# Patient Record
Sex: Female | Born: 1965 | Race: White | Hispanic: No | Marital: Married | Smoking: Never smoker
Health system: Southern US, Community
[De-identification: ages and names within clinical notes are randomized; demographics above are authoritative.]

## PROBLEM LIST (undated history)

## (undated) DIAGNOSIS — M810 Age-related osteoporosis without current pathological fracture: Secondary | ICD-10-CM

## (undated) DIAGNOSIS — I219 Acute myocardial infarction, unspecified: Secondary | ICD-10-CM

## (undated) DIAGNOSIS — I2542 Coronary artery dissection: Principal | ICD-10-CM

## (undated) DIAGNOSIS — H269 Unspecified cataract: Secondary | ICD-10-CM

## (undated) HISTORY — DX: Unspecified cataract: H26.9

## (undated) HISTORY — DX: Coronary artery dissection: I25.42

## (undated) HISTORY — DX: Age-related osteoporosis without current pathological fracture: M81.0

## (undated) HISTORY — PX: BUNIONECTOMY: SHX129

## (undated) HISTORY — PX: CATARACT EXTRACTION, BILATERAL: SHX1313

## (undated) HISTORY — DX: Acute myocardial infarction, unspecified: I21.9

---

## 2015-01-04 DIAGNOSIS — I219 Acute myocardial infarction, unspecified: Secondary | ICD-10-CM

## 2015-01-04 HISTORY — DX: Acute myocardial infarction, unspecified: I21.9

## 2015-01-27 DIAGNOSIS — I2542 Coronary artery dissection: Secondary | ICD-10-CM

## 2015-01-27 HISTORY — DX: Coronary artery dissection: I25.42

## 2016-10-11 ENCOUNTER — Ambulatory Visit: Payer: Self-pay | Admitting: Family Medicine

## 2016-10-21 ENCOUNTER — Encounter: Payer: Self-pay | Admitting: Family Medicine

## 2016-10-21 ENCOUNTER — Ambulatory Visit (INDEPENDENT_AMBULATORY_CARE_PROVIDER_SITE_OTHER): Payer: BC Managed Care – PPO | Admitting: Family Medicine

## 2016-10-21 VITALS — BP 108/84 | HR 69 | Temp 98.8°F | Wt 168.0 lb

## 2016-10-21 DIAGNOSIS — I2542 Coronary artery dissection: Secondary | ICD-10-CM | POA: Insufficient documentation

## 2016-10-21 DIAGNOSIS — Z23 Encounter for immunization: Secondary | ICD-10-CM | POA: Diagnosis not present

## 2016-10-21 DIAGNOSIS — H2513 Age-related nuclear cataract, bilateral: Secondary | ICD-10-CM | POA: Diagnosis not present

## 2016-10-21 MED ORDER — ASPIRIN 81 MG PO TABS: 81.0000 mg | ORAL_TABLET | Freq: Every day | ORAL | 0 refills | Status: DC

## 2016-10-21 MED ORDER — LEVONORGESTREL 20 MCG/24HR IU IUD: 1.0000 | INTRAUTERINE_SYSTEM | Freq: Once | INTRAUTERINE | 0 refills | Status: DC

## 2016-10-21 MED ORDER — ATORVASTATIN CALCIUM 40 MG PO TABS: 40.0000 mg | ORAL_TABLET | Freq: Every day | ORAL | 3 refills | Status: DC

## 2016-10-21 NOTE — Patient Instructions (Signed)
WE NOW OFFER   [facility] FAST TRACK!!!  SAME DAY Appointments for ACUTE CARE  Such as: Sprains, Injuries, cuts, abrasions, rashes, muscle pain, joint pain, back pain Colds, flu, sore throats, headache, allergies, cough, fever  Ear pain, sinus and eye infections Abdominal pain, nausea, vomiting, diarrhea, upset stomach Animal/insect bites  3 Easy Ways to Schedule: Walk-In Scheduling Call in scheduling Mychart Sign-up: [email or web address]         

## 2016-10-21 NOTE — Progress Notes (Signed)
   Subjective:    Patient ID: Tina Juarez, female    DOB: December 25, 1965, 51 y.o.   MRN: [id number]  HPI 51 yr old female to establish with [place]. She moved to [place] from [place] about one month ago. She feels well but has a few requests. 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 This resulted in a small non-STEMI which was managed medically. Part of her follow up care included MRI scans of the carotids and renal arteries in March 2017 and then aCT angiogram of the heart in October 2017. These were all unremarkable and showed no evidence of aneurysms or of fibromuscular dysplasia. Her lipids were apparently within normal limits but she was started on Atorvastatin as a precaution. She has been taking low dose aspirin. She feels fine with no chest pain or SOB, and she exercises regularly. She needs to be established with a cardiologist here in [place]. She has early cataracts and needs a referral to an ophthalmologist as well. 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 She lives with her husband and son.    Review of Systems  Constitutional: Negative.   Respiratory: Negative.   Cardiovascular: Negative.   Gastrointestinal: Negative.   Neurological: Negative.        Objective:   Physical Exam  Constitutional: She is oriented to person, place, and time. She appears well-developed and well-nourished.  Neck: No thyromegaly present.  Cardiovascular: Normal rate, regular rhythm, normal heart sounds and intact distal pulses.   Pulmonary/Chest: Effort normal and breath sounds normal. No respiratory distress. She has no wheezes. She has no rales.  Musculoskeletal: She exhibits no edema.  Lymphadenopathy:    She has no cervical adenopathy.  Neurological: She is alert and oriented to person, place, and time.          Assessment & Plan:  Introductory  visit with this patient. She is stable after a spontaneous coronary artery dissection last year. We will refer her to Cardiology. She has early cataracts so we will refer her to Ophthalmology. Given a flu shot and a rx for Shingrix vaccines.  Alysia Penna, MD

## 2017-01-16 ENCOUNTER — Encounter (INDEPENDENT_AMBULATORY_CARE_PROVIDER_SITE_OTHER): Payer: Self-pay

## 2017-01-16 ENCOUNTER — Encounter: Payer: Self-pay | Admitting: Cardiovascular Disease

## 2017-01-16 ENCOUNTER — Ambulatory Visit: Payer: BC Managed Care – PPO | Admitting: Cardiovascular Disease

## 2017-01-16 VITALS — BP 126/70 | HR 74 | Ht 67.0 in | Wt 174.0 lb

## 2017-01-16 DIAGNOSIS — I2510 Atherosclerotic heart disease of native coronary artery without angina pectoris: Secondary | ICD-10-CM

## 2017-01-16 NOTE — Progress Notes (Signed)
Cardiology Office Note Date:  01/19/2017   ID:  Wren, Pryce Sep 28, 1965, MRN [id number]  PCP:  Laurey Morale, MD  Cardiologist:  Sherren Mocha, MD    Chief Complaint  Patient presents with  . Establish Care    Hx Spontaneous Coronary Dissection     History of Present Illness: Tina Juarez is a 52 y.o. female who presents for initial evaluation of CAD with hx of spontaneous coronary artery dissection in 2017. She presented with NSTEMI (living in [place] at the time) and was found to have spontaneous dissection of the right PDA without other obstructive disease. She was treated conservatively with ASA and a statin drug.   Follow-up studies included an MRA of the cervical and renal arteries demonstrating no evidence of fibromuscular dysplasia, a CTA of the coronaries demonstrating normal appearance of the coronary arteries with no atherosclerosis and complete healing of the PDA. An echo at the time of her event demonstrated an apical wall motion abnormality with preserved LV systolic function.   She is here alone today.  She is doing fine with no specific cardiac-related complaints.  She denies chest pain, chest pressure, shortness of breath, edema, heart palpitations, lightheadedness, syncope, orthopnea, or PND.  She is physically active with no exertional complaints.  She feels like she is tolerating her medicines well.  Past Medical History:  Diagnosis Date  . Cataract   . 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 No pertinent surgical history.  Current Outpatient Medications  Medication Sig Dispense Refill  . aspirin 81 MG tablet Take 1 tablet (81 mg total) by mouth daily. 30 tablet 0  . levonorgestrel (MIRENA, 52 MG,) 20 MCG/24HR IUD 1 Intra Uterine Device (1 each total) by Intrauterine route once. 1 each 0   No current  facility-administered medications for this visit.     Allergies:   Patient has no known allergies.   Social History:  The patient  reports that  has never smoked. she has never used smokeless tobacco.   Family History:  The patient's family history includes Cancer in her father; Hyperlipidemia in her father; Mental retardation in her mother.    ROS:  Please see the history of present illness.   All other systems are reviewed and negative.    PHYSICAL EXAM: VS:  BP 126/70   Pulse 74   Ht 5\' 7"  (1.702 m)   Wt 174 lb (78.9 kg)   SpO2 95%   BMI 27.25 kg/m  , BMI Body mass index is 27.25 kg/m. GEN: Well nourished, well developed, in no acute distress  HEENT: normal  Neck: no JVD, no masses. No carotid bruits Cardiac: RRR without murmur or gallop                Respiratory:  clear to auscultation bilaterally, normal work of breathing GI: soft, nontender, nondistended, + BS MS: no deformity or atrophy  Ext: no pretibial edema, pedal pulses 2+= bilaterally Skin: warm and dry, no rash Neuro:  Strength and sensation are intact Psych: euthymic mood, full affect  EKG:  EKG is ordered today. The ekg ordered today shows normal sinus rhythm 66 bpm, left axis deviation, nonspecific T wave flattening.  Recent Labs: No results found for requested labs within last 8760 hours.   Lipid Panel  No results found for: CHOL, TRIG, HDL, CHOLHDL, VLDL, LDLCALC,  LDLDIRECT    Wt Readings from Last 3 Encounters:  01/16/17 174 lb (78.9 kg)  10/21/16 168 lb (76.2 kg)     Cardiac Studies Reviewed: Cardiac catheterization, echo, and other imaging studies are all reviewed today.  These studies are scanned into the medical record.  Lipids are reviewed from 2015 prior to any cholesterol medication, but they are reported out in millimoles per liter with an HDL of 1.47 mmol/L, LDL 2.6 mmol/L, and total cholesterol 4.5 mmol/L.  All of these are within expected limits.  ASSESSMENT AND PLAN: Coronary  artery disease, native vessel, without angina: The patient is clinically stable and has had no recurrent events since her initial presentation in 2017.  Her studies are reviewed as detailed in the HPI.  Fortunately her follow-up coronary CT angiogram demonstrated normal-appearing coronary arteries including the PDA branch of the RCA which.  We reviewed recommended medical therapy and she will remain on aspirin 81 mg daily.  We discussed current guidelines related spontaneous coronary artery dissection and it does not appear that the patient had hyperlipidemia predating her event.  There does not appear to be significant benefit from use of a statin agent in this disease process based on current recommendations.  Will discontinue her statin drug and check lipids in 8 weeks.  I will plan to see her back in 1 year unless problems arise in the interim.  We discussed restrictions and recommend that she avoid heavy lifting or extremely vigorous exercise, but she understands normal aerobic activity and exercise is encouraged.  Current medicines are reviewed with the patient today.  The patient does not have concerns regarding medicines.  Labs/ tests ordered today include:   Orders Placed This Encounter  Procedures  . Lipid panel  . Hepatic function panel  . EKG 12-Lead    Disposition:   FU one year  Signed, Sherren Mocha, MD  01/19/2017 8:10 AM    [address], [place], [place]  [address] Phone: [phone]; Fax: [phone]

## 2017-01-16 NOTE — Patient Instructions (Signed)
Medication Instructions:  1) STOP LIPITOR  Labwork: Return for fasting labs in 2 MONTHS. Lab is open from 7:30AM to 5:00PM.  Testing/Procedures: None  Follow-Up: Your provider wants you to follow-up in: 1 year with Dr. Burt Knack. You will receive a reminder letter in the mail two months in advance. If you don't receive a letter, please call our office to schedule the follow-up appointment.    Any Other Special Instructions Will Be Listed Below (If Applicable).     If you need a refill on your cardiac medications before your next appointment, please call your pharmacy.

## 2017-01-19 ENCOUNTER — Encounter: Payer: Self-pay | Admitting: Cardiovascular Disease

## 2017-01-31 ENCOUNTER — Telehealth: Payer: Self-pay | Admitting: *Deleted

## 2017-01-31 NOTE — Telephone Encounter (Signed)
   [facility] Pre-operative Risk Assessment    Request for surgical clearance:  1. What type of surgery is being performed? Cataract extraction w/intraocular lens implantation of the right eye followed by the left eye   2. When is this surgery scheduled? 03/13/17 and 03/27/17   3. What type of clearance is required (medical clearance vs. Pharmacy clearance to hold med vs. Both)? Medical  4. Are there any medications that need to be held prior to surgery and how long?No. Pt does not need to stop any medication   5. Practice name and name of physician performing surgery? [facility]. Dr. Talbert Forest   6. What is your office phone and fax number? [phone].  Fax-[phone]   7. Anesthesia type (None, local, MAC, general) ? Topical anesthesia w/IV medication    _________________________________________________________________   (provider comments below)

## 2017-02-01 NOTE — Telephone Encounter (Signed)
   Primary Cardiologist: Sherren Mocha, MD  Chart reviewed as part of pre-operative protocol coverage. Given past medical history and time since last visit, based on ACC/AHA guidelines, Tina Juarez would be at acceptable risk for the planned procedure without further cardiovascular testing.   I will route this recommendation to the requesting party via Epic fax function and remove from pre-op pool.  Callback pool, please call office and make sure documentation was received.  Please call with questions.  Tami Lin Duke, PA 02/01/2017, 2:33 PM

## 2017-02-01 NOTE — Telephone Encounter (Signed)
LMOM Dr. Talbert Forest office we have faxed via Epic clearance letter. Any questions please call [phone].

## 2017-03-14 ENCOUNTER — Other Ambulatory Visit: Payer: BC Managed Care – PPO

## 2017-03-14 DIAGNOSIS — I2510 Atherosclerotic heart disease of native coronary artery without angina pectoris: Secondary | ICD-10-CM

## 2017-03-14 LAB — LIPID PANEL
CHOL/HDL RATIO: 2.7 ratio (ref 0.0–4.4)
Cholesterol, Total: 173 mg/dL (ref 100–199)
HDL: 64 mg/dL (ref >39)
LDL Calculated: 96 mg/dL (ref 0–99)
Triglycerides: 64 mg/dL (ref 0–149)
VLDL CHOLESTEROL CAL: 13 mg/dL (ref 5–40)

## 2017-03-14 LAB — HEPATIC FUNCTION PANEL
ALK PHOS: 66 IU/L (ref 39–117)
ALT: 11 IU/L (ref 0–32)
AST: 14 IU/L (ref 0–40)
Albumin: 4.2 g/dL (ref 3.5–5.5)
BILIRUBIN, DIRECT: 0.14 mg/dL (ref 0.00–0.40)
Bilirubin Total: 0.4 mg/dL (ref 0.0–1.2)
TOTAL PROTEIN: 6.8 g/dL (ref 6.0–8.5)

## 2017-05-11 ENCOUNTER — Telehealth: Payer: Self-pay | Admitting: Family Medicine

## 2017-05-11 DIAGNOSIS — Z0000 Encounter for general adult medical examination without abnormal findings: Secondary | ICD-10-CM

## 2017-05-11 DIAGNOSIS — Z209 Contact with and (suspected) exposure to unspecified communicable disease: Secondary | ICD-10-CM

## 2017-05-11 NOTE — Telephone Encounter (Signed)
Done

## 2017-06-14 ENCOUNTER — Encounter: Payer: Self-pay | Admitting: Internal Medicine

## 2017-06-16 ENCOUNTER — Other Ambulatory Visit (INDEPENDENT_AMBULATORY_CARE_PROVIDER_SITE_OTHER): Payer: BC Managed Care – PPO

## 2017-06-16 DIAGNOSIS — Z209 Contact with and (suspected) exposure to unspecified communicable disease: Secondary | ICD-10-CM

## 2017-06-19 LAB — MEASLES/MUMPS/RUBELLA IMMUNITY
Mumps IgG: <9 AU/mL — ABNORMAL LOW
Rubella: 1.61 index

## 2017-07-03 ENCOUNTER — Ambulatory Visit (INDEPENDENT_AMBULATORY_CARE_PROVIDER_SITE_OTHER): Payer: BC Managed Care – PPO | Admitting: *Deleted

## 2017-07-03 DIAGNOSIS — Z23 Encounter for immunization: Secondary | ICD-10-CM

## 2017-07-03 NOTE — Progress Notes (Signed)
Per orders of Dr. Sarajane Jews, injection of MMR vaccine given by Dorrene German. Patient tolerated injection well. Discussed vaccine series w/ Dr. Sarajane Jews and he stated that one vaccine is adequate for this patient.

## 2017-08-15 ENCOUNTER — Ambulatory Visit (AMBULATORY_SURGERY_CENTER): Payer: Self-pay | Admitting: *Deleted

## 2017-08-15 VITALS — Ht 67.0 in | Wt 170.8 lb

## 2017-08-15 DIAGNOSIS — Z1211 Encounter for screening for malignant neoplasm of colon: Secondary | ICD-10-CM

## 2017-08-15 NOTE — Progress Notes (Signed)
No egg or soy allergy known to patient  No issues with past sedation with any surgeries  or procedures, no intubation problems  No diet pills per patient No home 02 use per patient  No blood thinners per patient  Pt denies issues with constipation  No A fib or A flutter  EMMI video sent to pt's e mail  

## 2017-08-29 ENCOUNTER — Encounter: Payer: BC Managed Care – PPO | Admitting: Internal Medicine

## 2017-09-27 ENCOUNTER — Encounter: Payer: Self-pay | Admitting: Internal Medicine

## 2017-10-11 ENCOUNTER — Encounter: Payer: Self-pay | Admitting: Internal Medicine

## 2017-10-11 ENCOUNTER — Ambulatory Visit (AMBULATORY_SURGERY_CENTER): Payer: BC Managed Care – PPO | Admitting: Internal Medicine

## 2017-10-11 VITALS — BP 109/73 | HR 68 | Temp 98.7°F | Resp 14 | Ht 67.0 in | Wt 170.0 lb

## 2017-10-11 DIAGNOSIS — Z1211 Encounter for screening for malignant neoplasm of colon: Secondary | ICD-10-CM | POA: Diagnosis present

## 2017-10-11 DIAGNOSIS — D125 Benign neoplasm of sigmoid colon: Secondary | ICD-10-CM

## 2017-10-11 DIAGNOSIS — K635 Polyp of colon: Secondary | ICD-10-CM | POA: Diagnosis not present

## 2017-10-11 DIAGNOSIS — D129 Benign neoplasm of anus and anal canal: Secondary | ICD-10-CM

## 2017-10-11 DIAGNOSIS — D127 Benign neoplasm of rectosigmoid junction: Secondary | ICD-10-CM | POA: Diagnosis not present

## 2017-10-11 DIAGNOSIS — D128 Benign neoplasm of rectum: Secondary | ICD-10-CM

## 2017-10-11 DIAGNOSIS — K621 Rectal polyp: Secondary | ICD-10-CM | POA: Diagnosis not present

## 2017-10-11 HISTORY — PX: COLONOSCOPY: SHX174

## 2017-10-11 MED ORDER — SODIUM CHLORIDE 0.9 % IV SOLN: 500.0000 mL | INTRAVENOUS | Status: DC

## 2017-10-11 NOTE — Progress Notes (Signed)
A and O x3. Report to RN. Tolerated MAC anesthesia well.

## 2017-10-11 NOTE — Progress Notes (Signed)
Called to room to assist during endoscopic procedure.  Patient ID and intended procedure confirmed with present staff. Received instructions for my participation in the procedure from the performing physician.  

## 2017-10-11 NOTE — Patient Instructions (Addendum)
   I found and removed 2 tiny polyps. I will let you know pathology results and when to have another routine colonoscopy by mail and/or My Chart.  I appreciate the opportunity to care for you. Carl E. Gessner, MD, FACG  YOU HAD AN ENDOSCOPIC PROCEDURE TODAY AT THE [facility]:   Refer to the procedure report that was given to you for any specific questions about what was found during the examination.  If the procedure report does not answer your questions, please call your gastroenterologist to clarify.  If you requested that your care partner not be given the details of your procedure findings, then the procedure report has been included in a sealed envelope for you to review at your convenience later.  YOU SHOULD EXPECT: Some feelings of bloating in the abdomen. Passage of more gas than usual.  Walking can help get rid of the air that was put into your GI tract during the procedure and reduce the bloating. If you had a lower endoscopy (such as a colonoscopy or flexible sigmoidoscopy) you may notice spotting of blood in your stool or on the toilet paper. If you underwent a bowel prep for your procedure, you may not have a normal bowel movement for a few days.  Please Note:  You might notice some irritation and congestion in your nose or some drainage.  This is from the oxygen used during your procedure.  There is no need for concern and it should clear up in a day or so.  SYMPTOMS TO REPORT IMMEDIATELY:   Following lower endoscopy (colonoscopy or flexible sigmoidoscopy):  Excessive amounts of blood in the stool  Significant tenderness or worsening of abdominal pains  Swelling of the abdomen that is new, acute  Fever of 100F or higher   For urgent or emergent issues, a gastroenterologist can be reached at any hour by calling [phone].   DIET:  We do recommend a small meal at first, but then you may proceed to your regular diet.  Drink plenty of fluids but you should  avoid alcoholic beverages for 24 hours.  ACTIVITY:  You should plan to take it easy for the rest of today and you should NOT DRIVE or use heavy machinery until tomorrow (because of the sedation medicines used during the test).    FOLLOW UP: Our staff will call the number listed on your records the next business day following your procedure to check on you and address any questions or concerns that you may have regarding the information given to you following your procedure. If we do not reach you, we will leave a message.  However, if you are feeling well and you are not experiencing any problems, there is no need to return our call.  We will assume that you have returned to your regular daily activities without incident.  If any biopsies were taken you will be contacted by phone or by letter within the next 1-3 weeks.  Please call us at [phone] if you have not heard about the biopsies in 3 weeks.    SIGNATURES/CONFIDENTIALITY: You and/or your care partner have signed paperwork which will be entered into your electronic medical record.  These signatures attest to the fact that that the information above on your After Visit Summary has been reviewed and is understood.  Full responsibility of the confidentiality of this discharge information lies with you and/or your care-partner. 

## 2017-10-11 NOTE — Op Note (Signed)
[facility] Patient Name: Tina Juarez Procedure Date: 10/11/2017 9:04 AM MRN: [id number] Endoscopist: Gatha Mayer , MD Age: 52 Referring MD:  Date of Birth: Jul 29, 1965 Gender: Female Account #: [id number] Procedure:                Colonoscopy Indications:              Screening for colorectal malignant neoplasm, This                            is the patient's first colonoscopy Medicines:                Propofol per Anesthesia, Monitored Anesthesia Care Procedure:                Pre-Anesthesia Assessment:                           - Prior to the procedure, a History and Physical                            was performed, and patient medications and                            allergies were reviewed. The patient's tolerance of                            previous anesthesia was also reviewed. The risks                            and benefits of the procedure and the sedation                            options and risks were discussed with the patient.                            All questions were answered, and informed consent                            was obtained. Prior Anticoagulants: The patient has                            taken no previous anticoagulant or antiplatelet                            agents. ASA Grade Assessment: II - A patient with                            mild systemic disease. After reviewing the risks                            and benefits, the patient was deemed in                            satisfactory condition to undergo the procedure.  After obtaining informed consent, the colonoscope                            was passed under direct vision. Throughout the                            procedure, the patient's blood pressure, pulse, and                            oxygen saturations were monitored continuously. The                            Model PCF-[id number] scope was introduced     through the anus and advanced to the the cecum,                            identified by appendiceal orifice and ileocecal                            valve. The colonoscopy was performed without                            difficulty. The patient tolerated the procedure                            well. The quality of the bowel preparation was                            good. The ileocecal valve, appendiceal orifice, and                            rectum were photographed. The bowel preparation                            used was Miralax. Scope In: 9:14:10 AM Scope Out: 9:36:09 AM Scope Withdrawal Time: 0 hours 15 minutes 15 seconds  Total Procedure Duration: 0 hours 21 minutes 59 seconds  Findings:                 The perianal and digital rectal examinations were                            normal.                           Two sessile polyps were found in the rectum and                            sigmoid colon. The polyps were 2 to 3 mm in size.                            These polyps were removed with a cold biopsy                            forceps.  Resection and retrieval were complete.                            Verification of patient identification for the                            specimen was done. Estimated blood loss was minimal.                           The exam was otherwise without abnormality on                            direct and retroflexion views. Complications:            No immediate complications. Estimated Blood Loss:     Estimated blood loss was minimal. Impression:               - Two 2 to 3 mm polyps in the rectum and in the                            sigmoid colon, removed with a cold biopsy forceps.                            Resected and retrieved.                           - The examination was otherwise normal on direct                            and retroflexion views. Recommendation:           - Patient has a contact number available for                             emergencies. The signs and symptoms of potential                            delayed complications were discussed with the                            patient. Return to normal activities tomorrow.                            Written discharge instructions were provided to the                            patient.                           - Resume previous diet.                           - Continue present medications.                           - Repeat colonoscopy is recommended. The  colonoscopy date will be determined after pathology                            results from today's exam become available for                            review. Gatha Mayer, MD 10/11/2017 9:42:04 AM This report has been signed electronically.

## 2017-10-12 ENCOUNTER — Telehealth: Payer: Self-pay

## 2017-10-12 NOTE — Telephone Encounter (Signed)
  Follow up Call-  Call back number 10/11/2017  Post procedure Call Back phone  # [phone]  Permission to leave phone message Yes     Patient questions:  Do you have a fever, pain , or abdominal swelling? No. Pain Score  0 *  Have you tolerated food without any problems? Yes.    Have you been able to return to your normal activities? Yes.    Do you have any questions about your discharge instructions: Diet   No. Medications  No. Follow up visit  No.  Do you have questions or concerns about your Care? No.  Actions: * If pain score is 4 or above: No action needed, pain <4.

## 2017-10-16 ENCOUNTER — Ambulatory Visit: Payer: Self-pay | Admitting: *Deleted

## 2017-10-16 ENCOUNTER — Encounter (HOSPITAL_COMMUNITY): Payer: Self-pay

## 2017-10-16 ENCOUNTER — Ambulatory Visit (INDEPENDENT_AMBULATORY_CARE_PROVIDER_SITE_OTHER): Payer: BC Managed Care – PPO

## 2017-10-16 ENCOUNTER — Ambulatory Visit (HOSPITAL_COMMUNITY)
Admission: EM | Admit: 2017-10-16 | Discharge: 2017-10-16 | Disposition: A | Discharge status: Home / Self Care | Payer: BC Managed Care – PPO | Attending: Family Medicine | Admitting: Family Medicine

## 2017-10-16 DIAGNOSIS — W19XXXA Unspecified fall, initial encounter: Secondary | ICD-10-CM

## 2017-10-16 DIAGNOSIS — S8264XA Nondisplaced fracture of lateral malleolus of right fibula, initial encounter for closed fracture: Secondary | ICD-10-CM

## 2017-10-16 MED ORDER — IBUPROFEN 800 MG PO TABS: 800.0000 mg | ORAL_TABLET | Freq: Three times a day (TID) | ORAL | 0 refills | Status: DC

## 2017-10-16 NOTE — ED Triage Notes (Signed)
Pt presents with right ankle pain and swelling from injury from [place].

## 2017-10-16 NOTE — Discharge Instructions (Signed)
Small fracture off end of ankle Use anti-inflammatories for pain/swelling. You may take up to 800 mg Ibuprofen every 8 hours with food. You may supplement Ibuprofen with Tylenol [phone] mg every 8 hours.  Ice and elevate when at home Please use boot and crutches to keep foot immobilized and non-weightbearing until following up with orthopedics Follow up with orthopedics

## 2017-10-16 NOTE — Telephone Encounter (Signed)
Patient's husband is calling to report that [place] now. His wife has fallen and sprained her ankle. He wants to know where to take her for x-ray as they are bring her down in a basket now. Informed patient that I am not aware of the closest hospital in that area- he states he is bringing her back to [facility] and wants to know the best UC. Told him he could take her to [facility] beside the ED- that way if she has broken a bone- she can be transferred over. He voices understanding. Patient not triaged.  Reason for Disposition . [1] Other NON-URGENT information for PCP AND [2] does not require PCP response  Answer Assessment - Initial Assessment Questions 1. REASON FOR CALL or QUESTION: "What is your reason for calling today?" or "How can I best help you?" or "What question do you have that I can help answer?"     Advisement about where to take wife for x-ray  2. CALLER: Document the source of call. (e.g., laboratory, patient).     Husband  Protocols used: PCP CALL - NO TRIAGE-A-AH

## 2017-10-16 NOTE — Telephone Encounter (Signed)
Dr Fry - FYI. Thanks! 

## 2017-10-16 NOTE — ED Provider Notes (Signed)
[place]    CSN: [id number] Arrival date & time: 10/16/17  [place]     History   Chief Complaint Chief Complaint  Patient presents with  . Ankle Pain    Right    HPI Keondria Siever is a 52 y.o. female no contributing past medical history presenting today for evaluation of right ankle injury.  Patient was hiking earlier today, [place] and [place] her ankle.  Since she has had significant pain and swelling as well as bruising.  Patient has not taken anything for pain.  [place] and [place] in temporary splint.  Denies previous issues with this ankle.  HPI  Past Medical History:  Diagnosis Date  . Cataract    removed bilat 03-2017  . Myocardial infarction [facility]) 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   . 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 BUNIONECTOMY    . CATARACT EXTRACTION, BILATERAL Bilateral    03-2017    OB History   None      Home Medications    Prior to Admission medications   Medication Sig Start Date End Date Taking? Authorizing Provider  aspirin 81 MG tablet Take 1 tablet (81 mg total) by mouth daily. 10/21/16   Laurey Morale, MD  ibuprofen (ADVIL,MOTRIN) 800 MG tablet Take 1 tablet (800 mg total) by mouth 3 (three) times daily. 10/16/17   Wieters, Hallie C, PA-C  levonorgestrel (MIRENA, 52 MG,) 20 MCG/24HR IUD 1 Intra Uterine Device (1 each total) by Intrauterine route once. 10/21/16 01/16/17  Laurey Morale, MD    Family History Family History  Problem Relation Age of Onset  . Mental retardation Mother   . Cancer Father   . Hyperlipidemia Father   . Prostate cancer Father   . Colon polyps Neg Hx   .  Colon cancer Neg Hx   . Esophageal cancer Neg Hx   . Rectal cancer Neg Hx   . Stomach cancer Neg Hx     Social History Social History   Tobacco Use  . Smoking status: Never Smoker  . Smokeless tobacco: Never Used  Substance Use Topics  . Alcohol use: Yes    Comment: weekly - 5 a week   . Drug use: Never     Allergies   Patient has no known allergies.   Review of Systems Review of Systems  Constitutional: Negative for fatigue and fever.  Eyes: Negative for visual disturbance.  Respiratory: Negative for shortness of breath.   Cardiovascular: Negative for chest pain.  Gastrointestinal: Negative for abdominal pain, nausea and vomiting.  Musculoskeletal: Positive for arthralgias, joint swelling and myalgias.  Skin: Positive for color change. Negative for rash and wound.  Neurological: Negative for dizziness, weakness, light-headedness and headaches.     Physical Exam Triage Vital Signs ED Triage Vitals  Enc Vitals Group     BP 10/16/17 1728 (!) 119/59     Pulse Rate 10/16/17 1728 83     Resp 10/16/17 1728 20     Temp 10/16/17 1728 98.4 F (36.9 C)     Temp Source 10/16/17 1728 Oral     SpO2  10/16/17 1728 100 %     Weight --      Height --      Head Circumference --      Peak Flow --      Pain Score 10/16/17 1731 5     Pain Loc --      Pain Edu? --      Excl. in [place]? --    No data found.  Updated Vital Signs BP (!) 119/59 (BP Location: Left Arm)   Pulse 83   Temp 98.4 F (36.9 C) (Oral)   Resp 20   SpO2 100%   Visual Acuity Right Eye Distance:   Left Eye Distance:   Bilateral Distance:    Right Eye Near:   Left Eye Near:    Bilateral Near:     Physical Exam  Constitutional: She is oriented to person, place, and time. She appears well-developed and well-nourished.  No acute distress  HENT:  Head: Normocephalic and atraumatic.  Nose: Nose normal.  Eyes: Conjunctivae are normal.  Neck: Neck supple.  Cardiovascular: Normal rate.    Pulmonary/Chest: Effort normal. No respiratory distress.  Abdominal: She exhibits no distension.  Musculoskeletal: Normal range of motion.  Significant bruising and swelling overlying lateral malleolus of right ankle, tenderness overlying this area, nontender over first through fifth metatarsals  Dorsalis pedis 2+, cap refill less than 2 seconds  Neurological: She is alert and oriented to person, place, and time.  Skin: Skin is warm and dry.  Psychiatric: She has a normal mood and affect.  Nursing note and vitals reviewed.    UC Treatments / Results  Labs (all labs ordered are listed, but only abnormal results are displayed) Labs Reviewed - No data to display  EKG None  Radiology Dg Ankle Complete Right  Result Date: 10/16/2017 CLINICAL DATA:  Initial evaluation for acute trauma, fall. EXAM: RIGHT ANKLE - COMPLETE 3+ VIEW COMPARISON:  None. FINDINGS: 6 mm osseous fragment at the distal aspect of the lateral malleolus consistent with a small fracture fragment, likely an avulsive injury. Prominent overlying soft tissue swelling. Ankle mortise remains approximated. Distal tibia intact. Talar dome intact. Vascular phleboliths noted within the lower leg. IMPRESSION: 1. 6 mm mildly displaced avulsion fracture rising from the distal right fibula. 2. Overlying soft tissue swelling. Electronically Signed   By: Jeannine Boga M.D.   On: 10/16/2017 18:01    Procedures Procedures (including critical care time)  Medications Ordered in UC Medications - No data to display  Initial Impression / Assessment and Plan / UC Course  I have reviewed the triage vital signs and the nursing notes.  Pertinent labs & imaging results that were available during my care of the patient were reviewed by me and considered in my medical decision making (see chart for details).     Avulsion fracture of distal lateral malleolus of fibula, will place patient in boot, nonweightbearing with crutches, ice  and elevation, anti-inflammatories, follow-up with orthopedics for further recommendations regarding weightbearing and follow-up. Discussed strict return precautions. Patient verbalized understanding and is agreeable with plan.  Final Clinical Impressions(s) / UC Diagnoses   Final diagnoses:  Closed nondisplaced fracture of lateral malleolus of right fibula, initial encounter     Discharge Instructions     Small fracture off end of ankle Use anti-inflammatories for pain/swelling. You may take up to 800 mg Ibuprofen every 8 hours with food. You may supplement Ibuprofen with Tylenol [phone] mg every 8 hours.  Ice and elevate when at  home Please use boot and crutches to keep foot immobilized and non-weightbearing until following up with orthopedics Follow up with orthopedics   ED Prescriptions    Medication Sig Dispense Auth. Provider   ibuprofen (ADVIL,MOTRIN) 800 MG tablet Take 1 tablet (800 mg total) by mouth 3 (three) times daily. 21 tablet Wieters, [place] C, PA-C     Controlled Substance Prescriptions [place] Controlled Substance Registry consulted? Not Applicable   Janith Lima, [place] 10/16/17 1823

## 2017-10-25 ENCOUNTER — Encounter: Payer: Self-pay | Admitting: Internal Medicine

## 2017-10-25 NOTE — Progress Notes (Signed)
Distal hyperplastics 10 yr 2029 recall My Chart

## 2017-10-27 ENCOUNTER — Ambulatory Visit (INDEPENDENT_AMBULATORY_CARE_PROVIDER_SITE_OTHER): Payer: BC Managed Care – PPO

## 2017-10-27 DIAGNOSIS — Z23 Encounter for immunization: Secondary | ICD-10-CM

## 2018-02-20 ENCOUNTER — Encounter: Payer: Self-pay | Admitting: Family Medicine

## 2018-02-20 ENCOUNTER — Ambulatory Visit: Payer: Self-pay | Admitting: *Deleted

## 2018-02-20 NOTE — Telephone Encounter (Signed)
Actually I have a free sample of Xofluza that she can come by and pick up (she takes 2 pills at one time)

## 2018-02-20 NOTE — Telephone Encounter (Signed)
I called the pt and she is aware of sample of the xofluza that we have for her.  She will come in today to pick this up.

## 2018-02-20 NOTE — Telephone Encounter (Signed)
Dr. Fry please advise. Thanks  

## 2018-02-20 NOTE — Telephone Encounter (Signed)
Summary: Flu Exposure   Pt stated her daughter was diagnosed with Influenza B. Wanted to request [facility] because she has a history of having a heart attack and she doesn't want to risk getting the flu. Please advise.      Patient is requesting [facility] the [facility].  Reason for Disposition . [1] Influenza EXPOSURE (Close Contact) within last 48 hours (2 days) AND [2] exposed person is HIGH RISK (e.g., age > 18 years, pregnant, HIV+, chronic medical condition)  Answer Assessment - Initial Assessment Questions 1. TYPE of EXPOSURE: "How were you exposed?" (e.g., close contact, not a close contact)     Daughter- close contact- she has been caring for her since she got sick 2. DATE of EXPOSURE: "When did the exposure occur?" (e.g., hour, days, weeks)     Daughter was diagnosed yesterday 3. PREGNANCY: "Is there any chance you are pregnant?" "When was your last menstrual period?"     n/a 4. HIGH RISK for COMPLICATIONS: "Do you have any heart or lung problems? Do you have a weakened immune system?" (e.g., CHF, COPD, asthma, HIV positive, chemotherapy, renal failure, diabetes mellitus, sickle cell anemia)     History of heart attack 5. SYMPTOMS: "Do you have any symptoms?" (e.g., cough, fever, sore throat, difficulty breathing).     No symptoms present  Protocols used: INFLUENZA EXPOSURE-A-AH

## 2018-05-11 ENCOUNTER — Telehealth: Payer: Self-pay | Admitting: Physician Assistant

## 2018-05-11 NOTE — Telephone Encounter (Signed)
° ° ° ° °  VIDEO/MyChart App visit on 05/16/2018       Consent sent via MyChart        -   05/11/2018

## 2018-05-15 NOTE — Progress Notes (Signed)
Virtual Visit via Video Note   This visit type was conducted due to national recommendations for restrictions regarding the COVID-19 Pandemic (e.g. social distancing) in an effort to limit this patient's exposure and mitigate transmission in our community.  Due to her co-morbid illnesses, this patient is at least at moderate risk for complications without adequate follow up.  This format is felt to be most appropriate for this patient at this time.  All issues noted in this document were discussed and addressed.  A limited physical exam was performed with this format.  Please refer to the patient's chart for her consent to telehealth for [facility].   Date:  05/16/2018   ID:  Tina Juarez, DOB 13-Jul-1965, MRN [id number]  Patient Location: Home Provider Location: Home  PCP:  Laurey Morale, MD  Cardiologist:  Sherren Mocha, MD   Electrophysiologist:  None   Evaluation Performed:  Follow-Up Visit  Chief Complaint: Follow-up on history of spontaneous coronary artery dissection  History of Present Illness:    Tina Juarez is a 53 y.o. female with coronary artery disease s/p NSTEMI in 2017 while living in [place].  Cardiac Catheterization demonstrated spontaneous dissection of the R PDA.  MRA of the cervical and renal arteries demonstrated no evidence of fibromuscular dysplasia.  A follow up CTA demonstrated normal coronary arteries and complete healing of the PDA.  She established with Dr. Burt Knack in 01/2017.    Today, she notes she is doing well.  She has occasional chest discomfort when she exercises.  She has noticed this since her heart attack.  Overall it has improved.  She has been having some minimal perimenopausal symptoms.  She currently has a Mirena device.  She questioned whether or not she could take over-the-counter supplements to help with symptoms.  I have cautioned her against this as some of these have naturally occurring estrogens.  She can discuss  this further with primary care.  The patient does not have symptoms concerning for COVID-19 infection (fever, chills, cough, or new shortness of breath).    Past Medical History:  Diagnosis Date  . Cataract    removed bilat 03-2017  . Myocardial infarction [facility]) 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   . 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 BUNIONECTOMY    . CATARACT EXTRACTION, BILATERAL Bilateral    03-2017     Current Meds  Medication Sig  . aspirin 81 MG tablet Take 1 tablet (81 mg total) by mouth daily.  Marland Kitchen levonorgestrel (MIRENA, 52 MG,) 20 MCG/24HR IUD 1 Intra Uterine Device (1 each total) by Intrauterine route once.     Allergies:   Patient has no known allergies.   Social History   Tobacco Use  . Smoking status: Never Smoker  . Smokeless tobacco: Never Used  Substance Use Topics  . Alcohol use: Yes    Comment: weekly - 5 a week   . Drug use: Never     Family Hx: The patient's family history includes Cancer in her father; Hyperlipidemia in her father; Mental retardation in her mother; Prostate cancer in her father. There is no history of Colon polyps, Colon cancer, Esophageal cancer, Rectal cancer, or Stomach cancer.  ROS:   Please see the history of present illness.     All other systems reviewed  and are negative.   Prior CV studies:   The following studies were reviewed today:   Echo 01/29/2015 ([place]) Normal LVEF, mild MR, trivial TR  Coronary CTA 10/14/2015 Ca score 0 No evidence of previous coronary artery dissection of the RCA   Labs/Other Tests and Data Reviewed:    EKG:  No ECG reviewed.  Recent Labs: No results found for requested labs within last 8760 hours.   Recent Lipid Panel Lab Results  Component Value Date/Time   CHOL 173 03/14/2017 08:45 AM   TRIG 64  03/14/2017 08:45 AM   HDL 64 03/14/2017 08:45 AM   CHOLHDL 2.7 03/14/2017 08:45 AM   LDLCALC 96 03/14/2017 08:45 AM     Wt Readings from Last 3 Encounters:  05/16/18 169 lb (76.7 kg)  10/11/17 170 lb (77.1 kg)  08/15/17 170 lb 12.8 oz (77.5 kg)     Objective:    Vital Signs:  Ht 5\' 7"  (1.702 m)   Wt 169 lb (76.7 kg)   BMI 26.47 kg/m    VITAL SIGNS:  reviewed GEN:  no acute distress EYES:  sclerae anicteric, EOMI - Extraocular Movements Intact RESPIRATORY:  Normal respiratory effort NEURO:  alert and oriented x 3, no obvious focal deficit PSYCH:  normal affect  ASSESSMENT & PLAN:    Spontaneous dissection of coronary artery  History of MI in 2017 while living in [place] secondary to spontaneous dissection of the right PDA.  Follow-up testing demonstrated no evidence of coronary plaque and a healed PDA.  She has occasional discomfort in her chest when she is exercising.  This has been present since the initial event.  Overall, it seems to be improving.  Continue aspirin.  She would like to continue to follow her cholesterol.  We will arrange a CMET and lipid panel in 3 months.  - Plan: Lipid panel, Comprehensive metabolic panel in 3 months  COVID-19 Education: The signs and symptoms of COVID-19 were discussed with the patient and how to seek care for testing (follow up with PCP or arrange E-visit).  The importance of social distancing was discussed today.  Time:   Today, I have spent 15 minutes with the patient with telehealth technology discussing the above problems.     Medication Adjustments/Labs and Tests Ordered: Current medicines are reviewed at length with the patient today.  Concerns regarding medicines are outlined above.   Tests Ordered: Orders Placed This Encounter  Procedures  . Lipid panel  . Comprehensive metabolic panel    Medication Changes: No orders of the defined types were placed in this encounter.   Disposition:  Follow up in 1 year(s)   Signed, Richardson Dopp, PA-C  05/16/2018 11:49 AM    [facility]

## 2018-05-16 ENCOUNTER — Telehealth (INDEPENDENT_AMBULATORY_CARE_PROVIDER_SITE_OTHER): Payer: BC Managed Care – PPO | Admitting: Physician Assistant

## 2018-05-16 ENCOUNTER — Encounter: Payer: Self-pay | Admitting: Physician Assistant

## 2018-05-16 VITALS — Ht 67.0 in | Wt 169.0 lb

## 2018-05-16 DIAGNOSIS — I2542 Coronary artery dissection: Secondary | ICD-10-CM | POA: Diagnosis not present

## 2018-05-16 DIAGNOSIS — Z7189 Other specified counseling: Secondary | ICD-10-CM

## 2018-05-16 NOTE — Patient Instructions (Signed)
Medication Instructions:  No changes  If you need a refill on your cardiac medications before your next appointment, please call your pharmacy.   Lab work:  We will arrange fasting Lipids in 3 months.  If you have labs (blood work) drawn today and your tests are completely normal, you will receive your results only by: Marland Kitchen MyChart Message (if you have MyChart) OR . A paper copy in the mail If you have any lab test that is abnormal or we need to change your treatment, we will call you to review the results.  Testing/Procedures: None   Follow-Up: At [facility], you and your health needs are our priority.  As part of our continuing mission to provide you with exceptional heart care, we have created designated Provider Care Teams.  These Care Teams include your primary Cardiologist (physician) and Advanced Practice Providers (APPs -  Physician Assistants and Nurse Practitioners) who all work together to provide you with the care you need, when you need it. You will need a follow up appointment in:  12 months.  Please call our office 2 months in advance to schedule this appointment.  You may see Sherren Mocha, MD or Richardson Dopp, PA-C   Any Other Special Instructions Will Be Listed Below (If Applicable).

## 2018-07-06 ENCOUNTER — Ambulatory Visit: Payer: Self-pay

## 2018-07-06 NOTE — Telephone Encounter (Signed)
Pt's husband called to request the patient have COVID testing done.  Reported that the pt's father is coming to stay with them, and he is considered a high risk, due to health issues.  Per husband, the pt. has not had any known exposure to COVID- positive individuals.  The pt. recently traveled to [facility], on vacation, from 6/21-6/28.  Advised will send note to PCP for further recommendation.  Agreed with plan.

## 2018-07-09 NOTE — Telephone Encounter (Signed)
Please schedule virtual visit to discuss testing.

## 2018-07-09 NOTE — Telephone Encounter (Signed)
Pt was upset when I called to get her scheduled she stated that she was told that she did not need an appointment to have it done that Dr. Sarajane Jews could do it at her husbands visit.  I explained to the pt that Dr. Sarajane Jews would not be able to chart on her visit if it is her husbands visit.  She state that she understood and so we made her a separate visit 07/10/2018 at 3:00pm.  Pt was very satisfied with me explaining to her about the visit and stated that she really appreciate me to the fullest for all that I did to get her and her daughter taking care of.

## 2018-07-10 ENCOUNTER — Encounter: Payer: Self-pay | Admitting: Family Medicine

## 2018-07-10 ENCOUNTER — Ambulatory Visit (INDEPENDENT_AMBULATORY_CARE_PROVIDER_SITE_OTHER): Payer: BC Managed Care – PPO | Admitting: Family Medicine

## 2018-07-10 ENCOUNTER — Other Ambulatory Visit: Payer: Self-pay

## 2018-07-10 DIAGNOSIS — R6889 Other general symptoms and signs: Secondary | ICD-10-CM | POA: Diagnosis not present

## 2018-07-10 DIAGNOSIS — Z20828 Contact with and (suspected) exposure to other viral communicable diseases: Secondary | ICD-10-CM | POA: Diagnosis not present

## 2018-07-10 NOTE — Progress Notes (Signed)
Subjective:    Patient ID: Tina Juarez, female    DOB: Sep 30, 1965, 53 y.o.   MRN: [id number]  HPI Virtual Visit via Video Note  I connected with the patient on 07/10/18 at  3:00 PM EDT by a video enabled telemedicine application and verified that I am speaking with the correct person using two identifiers.  Location patient: home Location provider:work or home office Persons participating in the virtual visit: patient, provider  I discussed the limitations of evaluation and management by telemedicine and the availability of in person appointments. The patient expressed understanding and agreed to proceed.   HPI: Here to ask about testing for the Covid-19 virus. She and her family justy returned from a trip to [facility] 10 days ago, so she is worried about possible exposure. Then 3 days ago she began to have some dry coughing, slight chest pressure, and sneezing. No fever or SOB or body aches or NVD. She is drinking fluids and taking Tylenol. She is also concerned because her 73 year old father who has serious medical issues will be moving in with her soon. She wants to make sure she does not have the virus.    ROS: See pertinent positives and negatives per HPI.  Past Medical History:  Diagnosis Date  . Cataract    removed bilat 03-2017  . Myocardial infarction [facility]) 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   . 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 BUNIONECTOMY    . CATARACT EXTRACTION, BILATERAL Bilateral    03-2017    Family History  Problem Relation Age of Onset  . Mental retardation Mother   . Cancer Father   . Hyperlipidemia Father   . Prostate cancer Father   . Colon polyps Neg Hx   . Colon cancer Neg Hx   . Esophageal cancer Neg Hx   . Rectal cancer Neg Hx   . Stomach cancer Neg Hx      Current Outpatient Medications:  .  aspirin 81 MG tablet, Take 1 tablet (81 mg total) by mouth daily., Disp: 30 tablet, Rfl: 0 .  levonorgestrel (MIRENA, 52 MG,) 20 MCG/24HR IUD, 1 Intra Uterine Device (1 each total) by Intrauterine route once., Disp: 1 each, Rfl: 0  EXAM:  VITALS per patient if applicable:  GENERAL: alert, oriented, appears well and in no acute distress  HEENT: atraumatic, conjunttiva clear, no obvious abnormalities on inspection of external nose and ears  NECK: normal movements of the head and neck  LUNGS: on inspection no signs of respiratory distress, breathing rate appears normal, no obvious gross SOB, gasping or wheezing  CV: no obvious cyanosis  MS: moves all visible extremities without noticeable abnormality  PSYCH/NEURO: pleasant and cooperative, no obvious depression or anxiety, speech and thought processing grossly intact  ASSESSMENT AND PLAN: Possible exposure to the Covid virus. We will arrange for her to be tested soon.  Alysia Penna, MD  Discussed the following assessment and plan:  No diagnosis found.     I discussed the assessment and treatment plan with the patient. The patient was provided an opportunity to ask questions and all were answered. The patient agreed with the plan and demonstrated an understanding of the instructions.   The patient was advised to call back or seek an in-person evaluation if the  symptoms worsen or if the condition fails to improve as anticipated.     Review of Systems     Objective:   Physical Exam        Assessment & Plan:

## 2018-07-11 ENCOUNTER — Other Ambulatory Visit: Payer: Self-pay

## 2018-07-11 ENCOUNTER — Telehealth: Payer: Self-pay | Admitting: *Deleted

## 2018-07-11 DIAGNOSIS — Z20822 Contact with and (suspected) exposure to covid-19: Secondary | ICD-10-CM

## 2018-07-11 NOTE — Telephone Encounter (Signed)
-----   Message from Elie Confer, [place] sent at 07/10/2018  2:25 PM EDT ----- Good afternoon!  Dr. Sarajane Jews would like to have this patient tested for covid 19.  She has been having cough, headache and body aches x 3 days.   Her father is coming to live with them in the next 2 weeks and he is 4 and has an immunocompromised system.  Thank you.

## 2018-07-11 NOTE — Telephone Encounter (Signed)
Pt scheduled for covid testing today @[facility]  @ 10:00. Instructions given and order placed

## 2018-07-16 LAB — NOVEL CORONAVIRUS, NAA: SARS-CoV-2, NAA: NOT DETECTED

## 2018-12-24 ENCOUNTER — Ambulatory Visit: Payer: BC Managed Care – PPO | Attending: Internal Medicine

## 2018-12-24 DIAGNOSIS — Z20822 Contact with and (suspected) exposure to covid-19: Secondary | ICD-10-CM

## 2018-12-25 LAB — NOVEL CORONAVIRUS, NAA: SARS-CoV-2, NAA: NOT DETECTED

## 2019-04-17 ENCOUNTER — Other Ambulatory Visit: Payer: Self-pay | Admitting: Family Medicine

## 2019-04-17 DIAGNOSIS — Z1231 Encounter for screening mammogram for malignant neoplasm of breast: Secondary | ICD-10-CM

## 2019-04-24 ENCOUNTER — Other Ambulatory Visit: Payer: Self-pay

## 2019-04-24 ENCOUNTER — Ambulatory Visit
Admission: RE | Admit: 2019-04-24 | Discharge: 2019-04-24 | Disposition: A | Discharge status: Home / Self Care | Payer: BC Managed Care – PPO | Source: Ambulatory Visit

## 2019-04-24 DIAGNOSIS — Z1231 Encounter for screening mammogram for malignant neoplasm of breast: Secondary | ICD-10-CM

## 2019-05-10 ENCOUNTER — Other Ambulatory Visit: Payer: Self-pay

## 2019-05-13 ENCOUNTER — Ambulatory Visit: Payer: BC Managed Care – PPO | Admitting: Obstetrics and Gynecology

## 2019-05-13 ENCOUNTER — Other Ambulatory Visit: Payer: Self-pay

## 2019-05-13 ENCOUNTER — Encounter: Payer: Self-pay | Admitting: Obstetrics and Gynecology

## 2019-05-13 VITALS — BP 122/68 | HR 82 | Temp 98.4°F | Ht 65.75 in | Wt 184.6 lb

## 2019-05-13 DIAGNOSIS — Z01419 Encounter for gynecological examination (general) (routine) without abnormal findings: Secondary | ICD-10-CM | POA: Diagnosis not present

## 2019-05-13 DIAGNOSIS — Z6830 Body mass index (BMI) 30.0-30.9, adult: Secondary | ICD-10-CM

## 2019-05-13 DIAGNOSIS — Z124 Encounter for screening for malignant neoplasm of cervix: Secondary | ICD-10-CM

## 2019-05-13 DIAGNOSIS — Z30432 Encounter for removal of intrauterine contraceptive device: Secondary | ICD-10-CM | POA: Diagnosis not present

## 2019-05-13 DIAGNOSIS — N9089 Other specified noninflammatory disorders of vulva and perineum: Secondary | ICD-10-CM

## 2019-05-13 DIAGNOSIS — R635 Abnormal weight gain: Secondary | ICD-10-CM

## 2019-05-13 DIAGNOSIS — Z0000 Encounter for general adult medical examination without abnormal findings: Secondary | ICD-10-CM

## 2019-05-13 DIAGNOSIS — N951 Menopausal and female climacteric states: Secondary | ICD-10-CM | POA: Diagnosis not present

## 2019-05-13 MED ORDER — BETAMETHASONE VALERATE 0.1 % EX OINT: 1.0000 "application " | TOPICAL_OINTMENT | Freq: Every day | CUTANEOUS | 0 refills | Status: DC

## 2019-05-13 MED ORDER — MEDROXYPROGESTERONE ACETATE 5 MG PO TABS: 5.0000 mg | ORAL_TABLET | Freq: Every day | ORAL | 6 refills | Status: DC

## 2019-05-13 NOTE — Progress Notes (Signed)
54 y.o. G42P1001 Married White or Caucasian Not Hispanic or Latino female here for annual exam. She has an over due Mirena inserted 6.5 years ago.  She is getting some hot flashes at night. She is having more broken sleep. Overall tolerable, not having hot flashes during the day. Mild vaginal dryness, not needing a lubricant. No bleeding. No dyspareunia. No bowel or bladder c/o.   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 Had cardiac cath, didn't get a stent. Managed with medication.   15 lb weight gain in the last 1.5 years. She is exercising.     No LMP recorded. (Menstrual status: IUD).          Sexually active: Yes.    The current method of family planning is IUD.    Exercising: Yes.    Walking  Smoker:  no  Health Maintenance: Pap:   2018 She moved here from [place] has not had one since being in the states 2.5 years. History of abnormal Pap:  no MMG:  04/24/19 Density C Bi-rads 1 neg  BMD:   no Colonoscopy: 10/11/2017 2 polyps f/u 10 years TDaP:  Up to date per patient  Gardasil: NA   reports that she has never smoked. She has never used smokeless tobacco. She reports current alcohol use. She reports that she does not use drugs. She drinks 6 glasses of wine a week. 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 She has 2 kids (one adopted), 22 and 19. Son graduating from [place] [employer]. Daughter in college in [place].   Past Medical History:  Diagnosis Date  . Cataract    removed bilat 03-2017  . Myocardial infarction [facility]) 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   . 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 BUNIONECTOMY    . CATARACT EXTRACTION, BILATERAL Bilateral    03-2017    Current Outpatient Medications  Medication Sig Dispense Refill  .  aspirin 81 MG tablet Take 1 tablet (81 mg total) by mouth daily. 30 tablet 0  . levonorgestrel (MIRENA, 52 MG,) 20 MCG/24HR IUD 1 Intra Uterine Device (1 each total) by Intrauterine route once. 1 each 0   No current facility-administered medications for this visit.    Family History  Problem Relation Age of Onset  . Mental retardation Mother   . Cancer Father   . Hyperlipidemia Father   . Prostate cancer Father   . Colon polyps Neg Hx   . Colon cancer Neg Hx   . Esophageal cancer Neg Hx   . Rectal cancer Neg Hx   . Stomach cancer Neg Hx   Father prostate cancer.  PGM Breast cancer in her 28's  Review of Systems  Genitourinary:       Night hot flashes and sweats.   Psychiatric/Behavioral: Positive for sleep disturbance.  All other systems reviewed and are negative.   Exam:   BP 122/68   Pulse 82   Temp 98.4 F (36.9 C)   Ht 5' 5.75" (1.67 m)   Wt 184 lb 9.6 oz (83.7 kg)   SpO2 100%   BMI 30.02 kg/m   Weight change: @WEIGHTCHANGE @ Height:   Height: 5' 5.75" (167 cm)  Ht Readings from Last 3 Encounters:  05/13/19  5' 5.75" (1.67 m)  05/16/18 5\' 7"  (1.702 m)  10/11/17 5\' 7"  (1.702 m)    General appearance: alert, cooperative and appears stated age Head: Normocephalic, without obvious abnormality, atraumatic Neck: no adenopathy, supple, symmetrical, trachea midline and thyroid normal to inspection and palpation Lungs: clear to auscultation bilaterally Cardiovascular: regular rate and rhythm Breasts: normal appearance, no masses or tenderness Abdomen: soft, non-tender; non distended,  no masses,  no organomegaly Extremities: extremities normal, atraumatic, no cyanosis or edema Skin: Skin color, texture, turgor normal. No rashes or lesions Lymph nodes: Cervical, supraclavicular, and axillary nodes normal. No abnormal inguinal nodes palpated Neurologic: Grossly normal   Pelvic: External genitalia:  There is some whitening and mild thickening of the tissue right under  the clitoris (patient denies irritation). Showed the patient the area with a mirror              Urethra:  normal appearing urethra with no masses, tenderness or lesions              Bartholins and Skenes: normal                 Vagina: normal appearing vagina with normal color and discharge, no lesions              Cervix: no lesions and IUD string 3 cm, IUD removed with ringed forceps               Bimanual Exam:  Uterus:  normal size, contour, position, consistency, mobility, non-tender              Adnexa: no mass, fullness, tenderness               Rectovaginal: Confirms               Anus:  normal sphincter tone, no lesions  Gae Dry chaperoned for the exam.  A:  Well Woman with normal exam other than some vulvar whitening  IUD overdue for removal  Perimenopausal  BMI 30, she has gained 15 lbs in 1.5 years    P:   Pap with hpv  Mammogram and colonoscopy UTD  Removed IUD  Screening labs TSH and FSH  Discussed breast self exam  Discussed calcium and vit D intake  Discussed weight loss  Use steroid ointment qd x 2 weeks, then f/u. If still has whitening will do a biopsy.

## 2019-05-13 NOTE — Patient Instructions (Addendum)
EXERCISE AND DIET:  We recommended that you start or continue a regular exercise program for good health. Regular exercise means any activity that makes your heart beat faster and makes you sweat.  We recommend exercising at least 30 minutes per day at least 3 days a week, preferably 4 or 5.  We also recommend a diet low in fat and sugar.  Inactivity, poor dietary choices and obesity can cause diabetes, heart attack, stroke, and kidney damage, among others.    ALCOHOL AND SMOKING:  Women should limit their alcohol intake to no more than 7 drinks/beers/glasses of wine (combined, not each!) per week. Moderation of alcohol intake to this level decreases your risk of breast cancer and liver damage. And of course, no recreational drugs are part of a healthy lifestyle.  And absolutely no smoking or even second hand smoke. Most people know smoking can cause heart and lung diseases, but did you know it also contributes to weakening of your bones? Aging of your skin?  Yellowing of your teeth and nails?  CALCIUM AND VITAMIN D:  Adequate intake of calcium and Vitamin D are recommended.  The recommendations for exact amounts of these supplements seem to change often, but generally speaking 1,200 mg of calcium (between diet and supplement) and 800 units of Vitamin D per day seems prudent. Certain women may benefit from higher intake of Vitamin D.  If you are among these women, your doctor will have told you during your visit.    PAP SMEARS:  Pap smears, to check for cervical cancer or precancers,  have traditionally been done yearly, although recent scientific advances have shown that most women can have pap smears less often.  However, every woman still should have a physical exam from her gynecologist every year. It will include a breast check, inspection of the vulva and vagina to check for abnormal growths or skin changes, a visual exam of the cervix, and then an exam to evaluate the size and shape of the uterus and  ovaries.  And after 55 years of age, a rectal exam is indicated to check for rectal cancers. We will also provide age appropriate advice regarding health maintenance, like when you should have certain vaccines, screening for sexually transmitted diseases, bone density testing, colonoscopy, mammograms, etc.   MAMMOGRAMS:  All women over 40 years old should have a yearly mammogram. Many facilities now offer a "3D" mammogram, which may cost around $50 extra out of pocket. If possible,  we recommend you accept the option to have the 3D mammogram performed.  It both reduces the number of women who will be called back for extra views which then turn out to be normal, and it is better than the routine mammogram at detecting truly abnormal areas.    COLON CANCER SCREENING: Now recommend starting at age 45. At this time colonoscopy is not covered for routine screening until 50. There are take home tests that can be done between 45-49.   COLONOSCOPY:  Colonoscopy to screen for colon cancer is recommended for all women at age 50.  We know, you hate the idea of the prep.  We agree, BUT, having colon cancer and not knowing it is worse!!  Colon cancer so often starts as a polyp that can be seen and removed at colonscopy, which can quite literally save your life!  And if your first colonoscopy is normal and you have no family history of colon cancer, most women don't have to have it again for   10 years.  Once every ten years, you can do something that may end up saving your life, right?  We will be happy to help you get it scheduled when you are ready.  Be sure to check your insurance coverage so you understand how much it will cost.  It may be covered as a preventative service at no cost, but you should check your particular policy.      Breast Self-Awareness Breast self-awareness means being familiar with how your breasts look and feel. It involves checking your breasts regularly and reporting any changes to your  health care provider. Practicing breast self-awareness is important. A change in your breasts can be a sign of a serious medical problem. Being familiar with how your breasts look and feel allows you to find any problems early, when treatment is more likely to be successful. All women should practice breast self-awareness, including women who have had breast implants. How to do a breast self-exam One way to learn what is normal for your breasts and whether your breasts are changing is to do a breast self-exam. To do a breast self-exam: Look for Changes  1. Remove all the clothing above your waist. 2. Stand in front of a mirror in a room with good lighting. 3. Put your hands on your hips. 4. Push your hands firmly downward. 5. Compare your breasts in the mirror. Look for differences between them (asymmetry), such as: ? Differences in shape. ? Differences in size. ? Puckers, dips, and bumps in one breast and not the other. 6. Look at each breast for changes in your skin, such as: ? Redness. ? Scaly areas. 7. Look for changes in your nipples, such as: ? Discharge. ? Bleeding. ? Dimpling. ? Redness. ? A change in position. Feel for Changes Carefully feel your breasts for lumps and changes. It is best to do this while lying on your back on the floor and again while sitting or standing in the shower or tub with soapy water on your skin. Feel each breast in the following way:  Place the arm on the side of the breast you are examining above your head.  Feel your breast with the other hand.  Start in the nipple area and make  inch (2 cm) overlapping circles to feel your breast. Use the pads of your three middle fingers to do this. Apply light pressure, then medium pressure, then firm pressure. The light pressure will allow you to feel the tissue closest to the skin. The medium pressure will allow you to feel the tissue that is a little deeper. The firm pressure will allow you to feel the tissue  close to the ribs.  Continue the overlapping circles, moving downward over the breast until you feel your ribs below your breast.  Move one finger-width toward the center of the body. Continue to use the  inch (2 cm) overlapping circles to feel your breast as you move slowly up toward your collarbone.  Continue the up and down exam using all three pressures until you reach your armpit.  Write Down What You Find  Write down what is normal for each breast and any changes that you find. Keep a written record with breast changes or normal findings for each breast. By writing this information down, you do not need to depend only on memory for size, tenderness, or location. Write down where you are in your menstrual cycle, if you are still menstruating. If you are having trouble noticing differences   in your breasts, do not get discouraged. With time you will become more familiar with the variations in your breasts and more comfortable with the exam. How often should I examine my breasts? Examine your breasts every month. If you are breastfeeding, the best time to examine your breasts is after a feeding or after using a breast pump. If you menstruate, the best time to examine your breasts is 5-7 days after your period is over. During your period, your breasts are lumpier, and it may be more difficult to notice changes. When should I see my health care provider? See your health care provider if you notice:  A change in shape or size of your breasts or nipples.  A change in the skin of your breast or nipples, such as a reddened or scaly area.  Unusual discharge from your nipples.  A lump or thick area that was not there before.  Pain in your breasts.  Anything that concerns you.   Perimenopause  Perimenopause is the normal time of life before and after menstrual periods stop completely (menopause). Perimenopause can begin 2-8 years before menopause, and it usually lasts for 1 year after  menopause. During perimenopause, the ovaries may or may not produce an egg. What are the causes? This condition is caused by a natural change in hormone levels that happens as you get older. What increases the risk? This condition is more likely to start at an earlier age if you have certain medical conditions or treatments, including:  A tumor of the pituitary gland in the brain.  A disease that affects the ovaries and hormone production.  Radiation treatment for cancer.  Certain cancer treatments, such as chemotherapy or hormone (anti-estrogen) therapy.  Heavy smoking and excessive alcohol use.  Family history of early menopause. What are the signs or symptoms? Perimenopausal changes affect each woman differently. Symptoms of this condition may include:  Hot flashes.  Night sweats.  Irregular menstrual periods.  Decreased sex drive.  Vaginal dryness.  Headaches.  Mood swings.  Depression.  Memory problems or trouble concentrating.  Irritability.  Tiredness.  Weight gain.  Anxiety.  Trouble getting pregnant. How is this diagnosed? This condition is diagnosed based on your medical history, a physical exam, your age, your menstrual history, and your symptoms. Hormone tests may also be done. How is this treated? In some cases, no treatment is needed. You and your health care provider should make a decision together about whether treatment is necessary. Treatment will be based on your individual condition and preferences. Various treatments are available, such as:  Menopausal hormone therapy (MHT).  Medicines to treat specific symptoms.  Acupuncture.  Vitamin or herbal supplements. Before starting treatment, make sure to let your health care provider know if you have a personal or family history of:  Heart disease.  Breast cancer.  Blood clots.  Diabetes.  Osteoporosis. Follow these instructions at home: Lifestyle  Do not use any products that  contain nicotine or tobacco, such as cigarettes and e-cigarettes. If you need help quitting, ask your health care provider.  Eat a balanced diet that includes fresh fruits and vegetables, whole grains, soybeans, eggs, lean meat, and low-fat dairy.  Get at least 30 minutes of physical activity on 5 or more days each week.  Avoid alcoholic and caffeinated beverages, as well as spicy foods. This may help prevent hot flashes.  Get 7-8 hours of sleep each night.  Dress in layers that can be removed to help you manage hot   flashes.  Find ways to manage stress, such as deep breathing, meditation, or journaling. General instructions  Keep track of your menstrual periods, including: ? When they occur. ? How heavy they are and how long they last. ? How much time passes between periods.  Keep track of your symptoms, noting when they start, how often you have them, and how long they last.  Take over-the-counter and prescription medicines only as told by your health care provider.  Take vitamin supplements only as told by your health care provider. These may include calcium, vitamin E, and vitamin D.  Use vaginal lubricants or moisturizers to help with vaginal dryness and improve comfort during sex.  Talk with your health care provider before starting any herbal supplements.  Keep all follow-up visits as told by your health care provider. This is important. This includes any group therapy or counseling. Contact a health care provider if:  You have heavy vaginal bleeding or pass blood clots.  Your period lasts more than 2 days longer than normal.  Your periods are recurring sooner than 21 days.  You bleed after having sex. Get help right away if:  You have chest pain, trouble breathing, or trouble talking.  You have severe depression.  You have pain when you urinate.  You have severe headaches.  You have vision problems. Summary  Perimenopause is the time when a woman's body  begins to move into menopause. This may happen naturally or as a result of other health problems or medical treatments.  Perimenopause can begin 2-8 years before menopause, and it usually lasts for 1 year after menopause.  Perimenopausal symptoms can be managed through medicines, lifestyle changes, and complementary therapies such as acupuncture. This information is not intended to replace advice given to you by your health care provider. Make sure you discuss any questions you have with your health care provider. Document Revised: 12/02/2016 Document Reviewed: 01/26/2016 Elsevier Patient Education  2020 Elsevier Inc.  

## 2019-05-14 ENCOUNTER — Other Ambulatory Visit (HOSPITAL_COMMUNITY)
Admission: RE | Admit: 2019-05-14 | Discharge: 2019-05-14 | Disposition: A | Discharge status: Home / Self Care | Payer: BC Managed Care – PPO | Source: Ambulatory Visit | Attending: Obstetrics and Gynecology | Admitting: Obstetrics and Gynecology

## 2019-05-14 DIAGNOSIS — Z124 Encounter for screening for malignant neoplasm of cervix: Secondary | ICD-10-CM | POA: Diagnosis not present

## 2019-05-14 LAB — CBC
Hematocrit: 40.7 % (ref 34.0–46.6)
Hemoglobin: 13.4 g/dL (ref 11.1–15.9)
MCH: 32 pg (ref 26.6–33.0)
MCHC: 32.9 g/dL (ref 31.5–35.7)
MCV: 97 fL (ref 79–97)
Platelets: 275 10*3/uL (ref 150–450)
RBC: 4.19 x10E6/uL (ref 3.77–5.28)
RDW: 13.2 % (ref 11.7–15.4)
WBC: 5.7 10*3/uL (ref 3.4–10.8)

## 2019-05-14 LAB — COMPREHENSIVE METABOLIC PANEL
ALT: 18 IU/L (ref 0–32)
AST: 19 IU/L (ref 0–40)
Albumin/Globulin Ratio: 1.4 (ref 1.2–2.2)
Albumin: 4 g/dL (ref 3.8–4.9)
Alkaline Phosphatase: 80 IU/L (ref 39–117)
BUN/Creatinine Ratio: 18 (ref 9–23)
BUN: 14 mg/dL (ref 6–24)
Bilirubin Total: 0.3 mg/dL (ref 0.0–1.2)
CO2: 26 mmol/L (ref 20–29)
Calcium: 9.2 mg/dL (ref 8.7–10.2)
Chloride: 102 mmol/L (ref 96–106)
Creatinine, Ser: 0.77 mg/dL (ref 0.57–1.00)
GFR calc Af Amer: 101 mL/min/{1.73_m2} (ref >59)
GFR calc non Af Amer: 88 mL/min/{1.73_m2} (ref >59)
Globulin, Total: 2.8 g/dL (ref 1.5–4.5)
Glucose: 82 mg/dL (ref 65–99)
Potassium: 4.6 mmol/L (ref 3.5–5.2)
Sodium: 140 mmol/L (ref 134–144)
Total Protein: 6.8 g/dL (ref 6.0–8.5)

## 2019-05-14 LAB — LIPID PANEL
Chol/HDL Ratio: 2.9 ratio (ref 0.0–4.4)
Cholesterol, Total: 181 mg/dL (ref 100–199)
HDL: 63 mg/dL (ref >39)
LDL Chol Calc (NIH): 100 mg/dL — ABNORMAL HIGH (ref 0–99)
Triglycerides: 99 mg/dL (ref 0–149)
VLDL Cholesterol Cal: 18 mg/dL (ref 5–40)

## 2019-05-14 LAB — TSH: TSH: 0.741 u[IU]/mL (ref 0.450–4.500)

## 2019-05-14 LAB — FOLLICLE STIMULATING HORMONE: FSH: 72.1 m[IU]/mL

## 2019-05-14 NOTE — Addendum Note (Signed)
Addended by: Dorothy Spark on: 05/14/2019 01:58 PM   Modules accepted: Orders

## 2019-05-15 LAB — CYTOLOGY - PAP
Comment: NEGATIVE
Diagnosis: NEGATIVE
High risk HPV: NEGATIVE

## 2019-05-29 ENCOUNTER — Other Ambulatory Visit: Payer: Self-pay

## 2019-05-30 ENCOUNTER — Encounter: Payer: Self-pay | Admitting: Obstetrics and Gynecology

## 2019-05-30 ENCOUNTER — Ambulatory Visit: Payer: BC Managed Care – PPO | Admitting: Obstetrics and Gynecology

## 2019-05-30 ENCOUNTER — Other Ambulatory Visit (HOSPITAL_COMMUNITY)
Admission: RE | Admit: 2019-05-30 | Discharge: 2019-05-30 | Disposition: A | Discharge status: Home / Self Care | Payer: BC Managed Care – PPO | Source: Ambulatory Visit | Attending: Obstetrics and Gynecology | Admitting: Obstetrics and Gynecology

## 2019-05-30 VITALS — BP 128/66 | HR 104 | Temp 98.8°F | Ht 67.0 in | Wt 180.0 lb

## 2019-05-30 DIAGNOSIS — N9089 Other specified noninflammatory disorders of vulva and perineum: Secondary | ICD-10-CM | POA: Insufficient documentation

## 2019-05-30 NOTE — Patient Instructions (Signed)

## 2019-05-30 NOTE — Progress Notes (Signed)
GYNECOLOGY  VISIT   HPI: 54 y.o.   Married White or Caucasian Not Hispanic or Latino  female   G1P1001 with No LMP recorded. (Menstrual status: IUD).   here for vulvar skin check. She has been using the ointment she was given at her last visit.  At the time of her annual exam earlier this month she was noted to have some vulvar whitening.   GYNECOLOGIC HISTORY: No LMP recorded. (Menstrual status: IUD). Contraception:none Menopausal hormone therapy: none        OB History    Gravida  1   Para  1   Term  1   Preterm      AB      Living  1     SAB      TAB      Ectopic      Multiple      Live Births  1              Patient Active Problem List   Diagnosis Date Noted  . 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 Cataract    removed bilat 03-2017  . Myocardial infarction [facility]) 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   . 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 BUNIONECTOMY    . CATARACT EXTRACTION, BILATERAL Bilateral    03-2017    Current Outpatient Medications  Medication Sig Dispense Refill  . aspirin 81 MG tablet Take 1 tablet (81 mg total) by mouth daily. 30 tablet 0  . betamethasone valerate ointment (VALISONE) 0.1 % Apply 1 application topically daily. 15 g 0  . levonorgestrel (MIRENA, 52 MG,) 20 MCG/24HR IUD 1 Intra Uterine Device (1 each total) by Intrauterine route once. 1 each 0  . medroxyPROGESTERone (PROVERA) 5 MG tablet Take 1 tablet (5 mg total) by mouth daily. Take in 2 months (mid July) if no spontaneous menses. 5 tablet 6   No current facility-administered medications for this visit.     ALLERGIES: Patient has no known allergies.  Family History  Problem Relation Age of Onset  . Mental retardation Mother   . Cancer Father    . Hyperlipidemia Father   . Prostate cancer Father   . Colon polyps Neg Hx   . Colon cancer Neg Hx   . Esophageal cancer Neg Hx   . Rectal cancer Neg Hx   . Stomach cancer Neg Hx     Social History   Socioeconomic History  . Marital status: Married    Spouse name: Not on file  . Number of children: Not on file  . Years of education: Not on file  . Highest education level: Not on file  Occupational History  . Not on file  Tobacco Use  . Smoking status: Never Smoker  . Smokeless tobacco: Never Used  Substance and Sexual Activity  . Alcohol use: Yes    Comment: weekly - 5 a week   . Drug use: Never  . Sexual activity: Yes    Birth control/protection: I.U.D.  Other Topics Concern  . Not on file  Social History Narrative  . Not on file   Social Determinants of Health   Financial Resource Strain:   . Difficulty of Paying Living Expenses:   Food Insecurity:   .  Worried About Charity fundraiser in the Last Year:   . Arboriculturist in the Last Year:   Transportation Needs:   . Film/video editor (Medical):   Marland Kitchen Lack of Transportation (Non-Medical):   Physical Activity:   . Days of Exercise per Week:   . Minutes of Exercise per Session:   Stress:   . Feeling of Stress :   Social Connections:   . Frequency of Communication with Friends and Family:   . Frequency of Social Gatherings with Friends and Family:   . Attends Religious Services:   . Active Member of Clubs or Organizations:   . Attends Archivist Meetings:   Marland Kitchen Marital Status:   Intimate Partner Violence:   . Fear of Current or Ex-Partner:   . Emotionally Abused:   Marland Kitchen Physically Abused:   . Sexually Abused:     Review of Systems  All other systems reviewed and are negative.   PHYSICAL EXAMINATION:    There were no vitals taken for this visit.    General appearance: alert, cooperative and appears stated age  Pelvic: External genitalia:  Whitening and slight thickening of the tissue  just under the clitoris.              Urethra:  normal appearing urethra with no masses, tenderness or lesions              Bartholins and Skenes: normal                  The risks of the procedure were reviewed with the patient and a consent was signed. The area was cleansed with Hibiclens and injected with 1% lidocaine. A 4 mm punch biopsy was used to remove a circular piece of tissue. The defect was closed with 4-0 vicryl. The patient tolerated the procedure well.   Chaperone was present for exam.  ASSESSMENT FSH elevated, IUD removed earlier this month BV with signs of BV, no symptoms Vulvar whitening, didn't improve with steroid ointment     PLAN She has cyclic provera, recommended she take it in 2 months if no spontaneous cycle. She will get in touch if she bleeds Vulvar biopsy done Further plans depending on results   An After Visit Summary was printed and given to the patient.

## 2019-06-04 ENCOUNTER — Other Ambulatory Visit: Payer: Self-pay

## 2019-06-05 LAB — SURGICAL PATHOLOGY

## 2019-11-02 ENCOUNTER — Ambulatory Visit: Payer: BC Managed Care – PPO | Attending: Internal Medicine

## 2019-11-02 DIAGNOSIS — Z23 Encounter for immunization: Secondary | ICD-10-CM

## 2019-11-02 NOTE — Progress Notes (Signed)
   Covid-19 Vaccination Clinic  Name:  Dasani Thurlow    MRN: [id number] DOB: December 07, 1965  11/02/2019  Ms. Crownover was observed post Covid-19 immunization for 15 minutes without incident. She was provided with Vaccine Information Sheet and instruction to access the V-Safe system.   Ms. Manfre was instructed to call 911 with any severe reactions post vaccine: Marland Kitchen Difficulty breathing  . Swelling of face and throat  . A fast heartbeat  . A bad rash all over body  . Dizziness and weakness

## 2020-02-26 ENCOUNTER — Other Ambulatory Visit: Payer: Self-pay

## 2020-02-27 ENCOUNTER — Ambulatory Visit (INDEPENDENT_AMBULATORY_CARE_PROVIDER_SITE_OTHER): Payer: BC Managed Care – PPO | Admitting: Family Medicine

## 2020-02-27 ENCOUNTER — Encounter: Payer: Self-pay | Admitting: Family Medicine

## 2020-02-27 VITALS — BP 112/74 | HR 75 | Temp 98.7°F | Ht 66.5 in | Wt 182.4 lb

## 2020-02-27 DIAGNOSIS — Z0000 Encounter for general adult medical examination without abnormal findings: Secondary | ICD-10-CM | POA: Diagnosis not present

## 2020-02-27 DIAGNOSIS — Z23 Encounter for immunization: Secondary | ICD-10-CM

## 2020-02-27 LAB — HEPATIC FUNCTION PANEL
ALT: 15 U/L (ref 0–35)
AST: 16 U/L (ref 0–37)
Albumin: 4.1 g/dL (ref 3.5–5.2)
Alkaline Phosphatase: 78 U/L (ref 39–117)
Bilirubin, Direct: 0.1 mg/dL (ref 0.0–0.3)
Total Bilirubin: 0.6 mg/dL (ref 0.2–1.2)
Total Protein: 6.8 g/dL (ref 6.0–8.3)

## 2020-02-27 LAB — CBC WITH DIFFERENTIAL/PLATELET
Basophils Absolute: 0 10*3/uL (ref 0.0–0.1)
Basophils Relative: 1 % (ref 0.0–3.0)
Eosinophils Absolute: 0.2 10*3/uL (ref 0.0–0.7)
Eosinophils Relative: 3.9 % (ref 0.0–5.0)
HCT: 39.7 % (ref 36.0–46.0)
Hemoglobin: 13.5 g/dL (ref 12.0–15.0)
Lymphocytes Relative: 23.9 % (ref 12.0–46.0)
Lymphs Abs: 1.1 10*3/uL (ref 0.7–4.0)
MCHC: 34 g/dL (ref 30.0–36.0)
MCV: 93.8 fl (ref 78.0–100.0)
Monocytes Absolute: 0.4 10*3/uL (ref 0.1–1.0)
Monocytes Relative: 7.4 % (ref 3.0–12.0)
Neutro Abs: 3.1 10*3/uL (ref 1.4–7.7)
Neutrophils Relative %: 63.8 % (ref 43.0–77.0)
Platelets: 256 10*3/uL (ref 150.0–400.0)
RBC: 4.23 Mil/uL (ref 3.87–5.11)
RDW: 14.1 % (ref 11.5–15.5)
WBC: 4.8 10*3/uL (ref 4.0–10.5)

## 2020-02-27 LAB — VITAMIN B12: Vitamin B-12: 336 pg/mL (ref 211–911)

## 2020-02-27 LAB — TSH: TSH: 0.82 u[IU]/mL (ref 0.35–4.50)

## 2020-02-27 LAB — LIPID PANEL
Cholesterol: 171 mg/dL (ref 0–200)
HDL: 64 mg/dL (ref >39.00)
LDL Cholesterol: 94 mg/dL (ref 0–99)
NonHDL: 106.55
Total CHOL/HDL Ratio: 3
Triglycerides: 63 mg/dL (ref 0.0–149.0)
VLDL: 12.6 mg/dL (ref 0.0–40.0)

## 2020-02-27 LAB — BASIC METABOLIC PANEL
BUN: 16 mg/dL (ref 6–23)
CO2: 32 mEq/L (ref 19–32)
Calcium: 9.3 mg/dL (ref 8.4–10.5)
Chloride: 102 mEq/L (ref 96–112)
Creatinine, Ser: 0.78 mg/dL (ref 0.40–1.20)
GFR: 85.49 mL/min (ref >60.00)
Glucose, Bld: 90 mg/dL (ref 70–99)
Potassium: 4.6 mEq/L (ref 3.5–5.1)
Sodium: 139 mEq/L (ref 135–145)

## 2020-02-27 LAB — T4, FREE: Free T4: 0.86 ng/dL (ref 0.60–1.60)

## 2020-02-27 LAB — T3, FREE: T3, Free: 3.3 pg/mL (ref 2.3–4.2)

## 2020-02-27 NOTE — Addendum Note (Signed)
Addended by: Agnes Lawrence on: 02/27/2020 09:43 AM   Modules accepted: Orders

## 2020-02-27 NOTE — Progress Notes (Signed)
   Subjective:    Patient ID: Tina Juarez, female    DOB: 30-May-1965, 55 y.o.   MRN: [id number]  HPI Here for a well exam. She feels well. She either walks or does Pilates every day.   Review of Systems  Constitutional: Negative.   HENT: Negative.   Eyes: Negative.   Respiratory: Negative.   Cardiovascular: Negative.   Gastrointestinal: Negative.   Genitourinary: Negative for decreased urine volume, difficulty urinating, dyspareunia, dysuria, enuresis, flank pain, frequency, hematuria, pelvic pain and urgency.  Musculoskeletal: Negative.   Skin: Negative.   Neurological: Negative.   Psychiatric/Behavioral: Negative.        Objective:   Physical Exam Constitutional:      General: She is not in acute distress.    Appearance: She is well-developed and well-nourished.  HENT:     Head: Normocephalic and atraumatic.     Right Ear: External ear normal.     Left Ear: External ear normal.     Nose: Nose normal.     Mouth/Throat:     Mouth: Oropharynx is clear and moist.     Pharynx: No oropharyngeal exudate.  Eyes:     General: No scleral icterus.    Extraocular Movements: EOM normal.     Conjunctiva/sclera: Conjunctivae normal.     Pupils: Pupils are equal, round, and reactive to light.  Neck:     Thyroid: No thyromegaly.     Vascular: No JVD.  Cardiovascular:     Rate and Rhythm: Normal rate and regular rhythm.     Pulses: Intact distal pulses.     Heart sounds: Normal heart sounds. No murmur heard. No friction rub. No gallop.   Pulmonary:     Effort: Pulmonary effort is normal. No respiratory distress.     Breath sounds: Normal breath sounds. No wheezing or rales.  Chest:     Chest wall: No tenderness.  Abdominal:     General: Bowel sounds are normal. There is no distension.     Palpations: Abdomen is soft. There is no mass.     Tenderness: There is no abdominal tenderness. There is no guarding or rebound.  Musculoskeletal:        General: No tenderness  or edema. Normal range of motion.     Cervical back: Normal range of motion and neck supple.  Lymphadenopathy:     Cervical: No cervical adenopathy.  Skin:    General: Skin is warm and dry.     Findings: No erythema or rash.  Neurological:     Mental Status: She is alert and oriented to person, place, and time.     Cranial Nerves: No cranial nerve deficit.     Motor: No abnormal muscle tone.     Coordination: Coordination normal.     Deep Tendon Reflexes: Reflexes are normal and symmetric. Reflexes normal.  Psychiatric:        Mood and Affect: Mood and affect normal.        Behavior: Behavior normal.        Thought Content: Thought content normal.        Judgment: Judgment normal.           Assessment & Plan:  Well exam. We discussed diet and exercise. Get fasting labs.  Alysia Penna, MD

## 2020-02-27 NOTE — Addendum Note (Signed)
Addended by: Tessie Fass D on: 02/27/2020 09:42 AM   Modules accepted: Orders

## 2020-02-28 LAB — HEPATITIS C ANTIBODY
Hepatitis C Ab: NONREACTIVE
SIGNAL TO CUT-OFF: 0 (ref <1.00)

## 2020-04-07 ENCOUNTER — Other Ambulatory Visit: Payer: Self-pay | Admitting: Family Medicine

## 2020-04-07 DIAGNOSIS — Z1231 Encounter for screening mammogram for malignant neoplasm of breast: Secondary | ICD-10-CM

## 2020-04-13 NOTE — Progress Notes (Addendum)
Cardiology Office Note:    Date:  04/14/2020   ID:  Tina Juarez, Tina Juarez 05-Feb-1965, MRN [id number]  PCP:  Tina Morale, MD   [facility]  Cardiologist:  Tina Mocha, MD   Advanced Practice Provider:  Liliane Shi, PA-C Electrophysiologist:  None       Referring MD: Tina Morale, MD   Chief Complaint:  Follow-up (Hx of SCAD (Spontaneous Coronary Artery Dissection) )    Patient Profile:     Tina Juarez is a 55 y.o. 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  She is here alone.  Since her heart attack, she has had days of exhaustion and occur about every 6 weeks or so.  In the last several months, she has noted those days have become more frequent.  She has occasional chest discomfort.  She also has palpitations that occur several times a week.  Sometimes her heartbeat feels rapid.  She exercises on a regular basis without exertional chest pain, shortness of breath.  She has not had syncope.  She has not had orthopnea, leg edema.    Past Medical History:  Diagnosis Date  . Cataract    removed bilat 03-2017  . Myocardial infarction [facility]) 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   . 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 aspirin 81 MG tablet Take 1 tablet (81 mg total) by mouth daily.     Allergies:   Patient has no known  allergies.   Social History   Tobacco Use  . Smoking status: Never Smoker  . Smokeless tobacco: Never Used  Vaping Use  . Vaping Use: Never used  Substance Use Topics  . Alcohol use: Yes    Comment: weekly - 5 a week   . Drug use: Never     Family Hx: The patient's family history includes Cancer in her father; Hyperlipidemia in her father; Mental retardation in her mother; Prostate cancer in her father. There is no history of Colon polyps, Colon cancer, Esophageal cancer, Rectal cancer, or Stomach cancer.  ROS   EKGs/Labs/Other Test Reviewed:    EKG:  EKG is   ordered today.  The ekg ordered today demonstrates normal sinus rhythm, heart rate 73, left axis deviation, low voltage, nonspecific ST-T wave changes, QTC (manually calculated) 441; no significant change when compared to prior tracing  Recent Labs: 02/27/2020: ALT 15; BUN 16; Creatinine, Ser 0.78; Hemoglobin 13.5; Platelets 256.0; Potassium 4.6; Sodium 139; TSH 0.82   Recent Lipid Panel Lab Results  Component Value Date/Time   CHOL 171 02/27/2020 09:42 AM   CHOL 181 05/13/2019 10:49 AM   TRIG 63.0 02/27/2020 09:42 AM   HDL 64.00 02/27/2020 09:42 AM   HDL 63 05/13/2019 10:49 AM   CHOLHDL 3 02/27/2020 09:42 AM   LDLCALC 94 02/27/2020 09:42 AM  LDLCALC 100 (H) 05/13/2019 10:49 AM      Risk Assessment/Calculations:      Physical Exam:    VS:  BP 124/60   Pulse 73   Ht 5\' 7"  (1.702 m)   Wt 180 lb 8 oz (81.9 kg)   SpO2 98%   BMI 28.27 kg/m     Wt Readings from Last 3 Encounters:  04/14/20 180 lb 8 oz (81.9 kg)  02/27/20 182 lb 6.4 oz (82.7 kg)  05/30/19 180 lb (81.6 kg)     Constitutional:      Appearance: Healthy appearance. Not in distress.  Neck:     Vascular: No JVR. JVD normal.  Pulmonary:     Effort: Pulmonary effort is normal.     Breath sounds: No wheezing. No rales.  Cardiovascular:     Normal rate. Regular rhythm. Normal S1. Normal S2.     Murmurs: There is no murmur.  Edema:     Peripheral edema absent.  Abdominal:     Palpations: Abdomen is soft. There is no hepatomegaly.  Skin:    General: Skin is warm and dry.  Neurological:     Mental Status: Alert and oriented to person, place and time.     Cranial Nerves: Cranial nerves are intact.        ASSESSMENT & PLAN:    1. Spontaneous dissection of coronary artery 2. Chest pain of uncertain etiology 3. Palpitations History of myocardial infarction while living in [place] in 2017.  Follow-up CT later in 2017 demonstrated that her RCA had completely healed.  She did take beta-blocker therapy for some time after but could not tolerate this due to fatigue.  She remains on aspirin therapy.  She has had a history of fatigue every 6 weeks or so since her heart attack.  This seems to be more frequent.  She exercises on a regular basis without exertional chest symptoms or shortness of breath.  She has noted rapid palpitations at times.  Recent TSH was normal.  Her electrocardiogram has diffuse nonspecific changes.  This is similar to her prior electrocardiogram in 2019.  Etiology of her symptoms is not entirely clear.  She cannot tolerate beta-blocker Rx due to side effects. I will arrange an echocardiogram to rule out structural heart disease.  Of note, her EF was normal in 2017 after her MI.  I will also arrange a 14-day live ZIO patch monitor to further characterize her palpitations.  She will be brought back in follow-up in the next 8 weeks to review the findings and determine if any further testing is needed.   Dispo:  Return in about 8 weeks (around 06/09/2020) for Routine follow up for 8 weeks with Richardson Dopp, PA with Dr. Burt Knack in office. .   Medication Adjustments/Labs and Tests Ordered: Current medicines are reviewed at length with the patient today.  Concerns regarding medicines are outlined above.  Tests Ordered: Orders Placed This Encounter  Procedures  . Cardiac event monitor  . EKG 12-Lead  .  ECHOCARDIOGRAM COMPLETE   Medication Changes: No orders of the defined types were placed in this encounter.   Signed, Richardson Dopp, PA-C  04/14/2020 5:07 PM    [facility] [place], [place], [place]  [address] Phone: [phone]; Fax: [phone]

## 2020-04-14 ENCOUNTER — Other Ambulatory Visit: Payer: Self-pay

## 2020-04-14 ENCOUNTER — Encounter: Payer: Self-pay | Admitting: Physician Assistant

## 2020-04-14 ENCOUNTER — Ambulatory Visit: Payer: BC Managed Care – PPO | Admitting: Physician Assistant

## 2020-04-14 ENCOUNTER — Encounter: Payer: Self-pay | Admitting: *Deleted

## 2020-04-14 VITALS — BP 124/60 | HR 73 | Ht 67.0 in | Wt 180.5 lb

## 2020-04-14 DIAGNOSIS — I2542 Coronary artery dissection: Secondary | ICD-10-CM | POA: Diagnosis not present

## 2020-04-14 DIAGNOSIS — R079 Chest pain, unspecified: Secondary | ICD-10-CM | POA: Diagnosis not present

## 2020-04-14 DIAGNOSIS — R002 Palpitations: Secondary | ICD-10-CM | POA: Diagnosis not present

## 2020-04-14 NOTE — Progress Notes (Signed)
Patient ID: Tina Juarez, female   DOB: 1965-05-26, 55 y.o.   MRN: [id number] Patient enrolled for [employer] to ship a 14 day cardiac event monitor to her home.

## 2020-04-14 NOTE — Patient Instructions (Addendum)
Medication Instructions:  Your physician recommends that you continue on your current medications as directed. Please refer to the Current Medication list given to you today.  *If you need a refill on your cardiac medications before your next appointment, please call your pharmacy*   Lab Work: -None   If you have labs (blood work) drawn today and your tests are completely normal, you will receive your results only by: Marland Kitchen MyChart Message (if you have MyChart) OR . A paper copy in the mail If you have any lab test that is abnormal or we need to change your treatment, we will call you to review the results.   Testing/Procedures  Your physician has requested that you have an echocardiogram. Echocardiography is a painless test that uses sound waves to create images of your heart. It provides your doctor with information about the size and shape of your heart and how well your heart's chambers and valves are working. This procedure takes approximately one hour. There are no restrictions for this procedure.  [employer] Cardiac Event Monitor Instructions Your physician has requested you wear your cardiac event monitor for __14___ days, (1-30). [employer] may call or text to confirm a shipping address. The monitor will be sent to a land address via UPS. [employer] will not ship a monitor to a PO BOX. It typically takes 3-5 days to receive your monitor after it has been enrolled. [employer] will assist with USPS tracking if your package is delayed. The telephone number for [employer] is [phone]. Once you have received your monitor, please review the enclosed instructions. Instruction tutorials can also be viewed under help and settings on the enclosed cell phone. Your monitor has already been registered assigning a specific monitor serial # to you.  Applying the monitor Remove cell phone from case and turn it on. The cell phone works as Dealer and needs to be within [employer] of  you at all times. The cell phone will need to be charged on a daily basis. We recommend you plug the cell phone into the enclosed charger at your bedside table every night.  Monitor batteries: You will receive two monitor batteries labelled #1 and #2. These are your recorders. Plug battery #2 onto the second connection on the enclosed charger. Keep one battery on the charger at all times. This will keep the monitor battery deactivated. It will also keep it fully charged for when you need to switch your monitor batteries. A small light will be blinking on the battery emblem when it is charging. The light on the battery emblem will remain on when the battery is fully charged.  Open package of a Monitor strip. Insert battery #1 into black hood on strip and gently squeeze monitor battery onto connection as indicated in instruction booklet. Set aside while preparing skin.  Choose location for your strip, vertical or horizontal, as indicated in the instruction booklet. Shave to remove all hair from location. There cannot be any lotions, oils, powders, or colognes on skin where monitor is to be applied. Wipe skin clean with enclosed Saline wipe. Dry skin completely.  Peel paper labeled #1 off the back of the Monitor strip exposing the adhesive. Place the monitor on the chest in the vertical or horizontal position shown in the instruction booklet. One arrow on the monitor strip must be pointing upward. Carefully remove paper labeled #2, attaching remainder of strip to your skin. Try not to create any folds or wrinkles in the strip as you apply it.  Firmly press and release the circle in the center of the monitor battery. You will hear a small beep. This is turning the monitor battery on. The heart emblem on the monitor battery will light up every 5 seconds if the monitor battery in turned on and connected to the patient securely. Do not push and hold the circle down as this turns the monitor battery  off. The cell phone will locate the monitor battery. A screen will appear on the cell phone checking the connection of your monitor strip. This may read poor connection initially but change to good connection within the next minute. Once your monitor accepts the connection you will hear a series of 3 beeps followed by a climbing crescendo of beeps. A screen will appear on the cell phone showing the two monitor strip placement options. Touch the picture that demonstrates where you applied the monitor strip.  Your monitor strip and battery are waterproof. You are able to shower, bathe, or swim with the monitor on. They just ask you do not submerge deeper than 3 feet underwater. We recommend removing the monitor if you are swimming in a lake, river, or ocean.  Your monitor battery will need to be switched to a fully charged monitor battery approximately once a week. The cell phone will alert you of an action which needs to be made.  On the cell phone, tap for details to reveal connection status, monitor battery status, and cell phone battery status. The green dots indicates your monitor is in good status. A red dot indicates there is something that needs your attention.  To record a symptom, click the circle on the monitor battery. In 30-60 seconds a list of symptoms will appear on the cell phone. Select your symptom and tap save. Your monitor will record a sustained or significant arrhythmia regardless of you clicking the button. Some patients do not feel the heart rhythm irregularities. [employer] will notify us of any serious or critical events.  Refer to instruction booklet for instructions on switching batteries, changing strips, the Do not disturb or Pause features, or any additional questions.  Call [employer] at [phone], to confirm your monitor is transmitting and record your baseline. They will answer any questions you may have regarding the monitor instructions at that  time.  Returning the monitor to [place] all equipment back into blue box. Peel off strip of paper to expose adhesive and close box securely. There is a prepaid UPS shipping label on this box. Drop in a UPS drop box, or at a UPS facility like [employer]. You may also contact [employer] to arrange UPS to pick up monitor package at your home.      Follow-Up: At [facility], you and your health needs are our priority.  As part of our continuing mission to provide you with exceptional heart care, we have created designated Provider Care Teams.  These Care Teams include your primary Cardiologist (physician) and Advanced Practice Providers (APPs -  Physician Assistants and Nurse Practitioners) who all work together to provide you with the care you need, when you need it.  We recommend signing up for the patient portal called "MyChart".  Sign up information is provided on this After Visit Summary.  MyChart is used to connect with patients for Virtual Visits (Telemedicine).  Patients are able to view lab/test results, encounter notes, upcoming appointments, etc.  Non-urgent messages can be sent to your provider as well.   To learn more about what you can do  with MyChart, go to [email or web address].    Your next appointment:   8 week(s)  The format for your next appointment:   In Person  Provider:   Richardson Dopp, PA-C with Dr.Cooper in the office.    Other Instructions -None

## 2020-04-19 ENCOUNTER — Ambulatory Visit (INDEPENDENT_AMBULATORY_CARE_PROVIDER_SITE_OTHER): Payer: BC Managed Care – PPO

## 2020-04-19 DIAGNOSIS — R002 Palpitations: Secondary | ICD-10-CM | POA: Diagnosis not present

## 2020-04-19 DIAGNOSIS — I2542 Coronary artery dissection: Secondary | ICD-10-CM

## 2020-04-19 DIAGNOSIS — R079 Chest pain, unspecified: Secondary | ICD-10-CM

## 2020-05-14 ENCOUNTER — Other Ambulatory Visit: Payer: Self-pay

## 2020-05-14 ENCOUNTER — Ambulatory Visit (HOSPITAL_COMMUNITY): Payer: BC Managed Care – PPO | Attending: Cardiology

## 2020-05-14 DIAGNOSIS — R002 Palpitations: Secondary | ICD-10-CM | POA: Diagnosis not present

## 2020-05-14 DIAGNOSIS — R079 Chest pain, unspecified: Secondary | ICD-10-CM | POA: Insufficient documentation

## 2020-05-14 DIAGNOSIS — I2542 Coronary artery dissection: Secondary | ICD-10-CM | POA: Diagnosis not present

## 2020-05-14 LAB — ECHOCARDIOGRAM COMPLETE
Area-P 1/2: 2.62 cm2
S' Lateral: 3.2 cm

## 2020-05-15 ENCOUNTER — Encounter: Payer: Self-pay | Admitting: Physician Assistant

## 2020-05-28 ENCOUNTER — Other Ambulatory Visit: Payer: Self-pay

## 2020-05-28 ENCOUNTER — Ambulatory Visit
Admission: RE | Admit: 2020-05-28 | Discharge: 2020-05-28 | Disposition: A | Discharge status: Home / Self Care | Payer: BC Managed Care – PPO | Source: Ambulatory Visit

## 2020-05-28 DIAGNOSIS — Z1231 Encounter for screening mammogram for malignant neoplasm of breast: Secondary | ICD-10-CM

## 2020-06-09 NOTE — Progress Notes (Signed)
Cardiology Office Note:    Date:  06/10/2020   ID:  Tina Juarez, Tina Juarez [birth year], MRN [id number]  PCP:  Tina Morale, MD   [facility] Providers Cardiologist:  Sherren Mocha, MD Cardiology APP:  Sharmon Revere     Referring MD: Tina Morale, MD   Chief Complaint:  Follow-up (Palpitations)    Patient Profile:    Tina Juarez is a 55 y.o. 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 Left ventricular ejection fraction, by estimation, is 55 to 60%. Left ventricular ejection fraction by 3D volume is 59 %. The left ventricle has normal function. The left ventricle has no regional wall motion abnormalities. Left ventricular diastolic parameters were normal. The average left ventricular global longitudinal strain is -25.3 %. 2. Right ventricular systolic function is normal. The right ventricular size is normal. There is normal pulmonary artery systolic pressure. The estimated right ventricular systolic pressure is 78.2 mmHg. 3. The mitral valve is normal in structure. Trivial mitral valve regurgitation. No evidence of mitral stenosis. 4. The aortic valve is tricuspid. Aortic valve regurgitation is not visualized. No aortic stenosis is present. 5. The inferior vena cava is normal in size with greater than 50% respiratory variability, suggesting right atrial pressure of 3 mmHg.  Echo 01/29/2015 ([place]) Normal LVEF, mild MR, trivial TR  Coronary CTA 10/14/2015 Ca score 0 No evidence of previous coronary artery dissection of the RCA    History of Present Illness: Tina Juarez was last seen in 4/22.  She noted symptoms of episodic fatigue every 6 weeks or so since her original myocardial infarction.  She also had  palpitations at times.  She noted occasional chest symptoms but no exertional symptoms.  An echocardiogram was obtained and demonstrated normal LV function.  She also wore an event monitor.  The official reading is not back yet but I did personally review this it demonstrated sinus rhythm.  There were no significant arrhythmias or pauses.  She returns for f/u.  She is here alone.  She notes that she had more emotional stress when she first came in with increased amounts of palpitations.  This has improved as her stress has improved.  She still has some palpitations from time to time.  She has not had any chest pain or shortness of breath.  She continues to have episodic exhaustion every several weeks to several months.  She has had this since her initial event.  There has not really been a significant change        Past Medical History:  Diagnosis Date  . Cataract    removed bilat 03-2017  . Myocardial infarction [facility]) 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   . 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 aspirin 81 MG tablet Take 1 tablet (81 mg total) by mouth daily.  . propranolol (INDERAL) 10 MG tablet Take 1 tablet (10 mg total) by mouth as needed. For palpitations.     Allergies:   Patient  has no known allergies.   Social History   Tobacco Use  . Smoking status: Never Smoker  . Smokeless tobacco: Never Used  Vaping Use  . Vaping Use: Never used  Substance Use Topics  . Alcohol use: Yes    Comment: weekly - 5 a week   . Drug use: Never     Family Hx: The patient's family history includes Cancer in her father; Hyperlipidemia in her father; Mental retardation in her mother; Prostate cancer in her father. There is no history of Colon polyps, Colon  cancer, Esophageal cancer, Rectal cancer, or Stomach cancer.  ROS   EKGs/Labs/Other Test Reviewed:    EKG:  EKG is not ordered today.  The ekg ordered today demonstrates n/a  Recent Labs: 02/27/2020: ALT 15; BUN 16; Creatinine, Ser 0.78; Hemoglobin 13.5; Platelets 256.0; Potassium 4.6; Sodium 139; TSH 0.82   Recent Lipid Panel Lab Results  Component Value Date/Time   CHOL 171 02/27/2020 09:42 AM   CHOL 181 05/13/2019 10:49 AM   TRIG 63.0 02/27/2020 09:42 AM   HDL 64.00 02/27/2020 09:42 AM   HDL 63 05/13/2019 10:49 AM   LDLCALC 94 02/27/2020 09:42 AM   LDLCALC 100 (H) 05/13/2019 10:49 AM      Risk Assessment/Calculations:      Physical Exam:    VS:  BP 118/76   Pulse 78   Ht 5\' 7"  (1.702 m)   Wt 180 lb (81.6 kg)   SpO2 96%   BMI 28.19 kg/m     Wt Readings from Last 3 Encounters:  06/10/20 180 lb (81.6 kg)  04/14/20 180 lb 8 oz (81.9 kg)  02/27/20 182 lb 6.4 oz (82.7 kg)     Constitutional:      Appearance: Healthy appearance. Not in distress.  Neck:     Vascular: JVD normal.  Pulmonary:     Effort: Pulmonary effort is normal.     Breath sounds: No wheezing. No rales.  Cardiovascular:     Normal rate. Regular rhythm. Normal S1. Normal S2.     Murmurs: There is no murmur.  Edema:    Peripheral edema absent.  Abdominal:     Palpations: Abdomen is soft.  Skin:    General: Skin is warm and dry.  Neurological:     Mental Status: Alert and oriented to person, place and time.     Cranial Nerves: Cranial nerves are intact.         ASSESSMENT & PLAN:    1. Spontaneous dissection of coronary artery History of myocardial infarction while living in [place] in 2017.  Follow-up CT later in 2017 demonstrated that her RCA had completely healed.  She did take beta-blocker therapy for some time after but could not tolerate this due to fatigue.  She remains on aspirin therapy.  Recent echocardiogram demonstrated normal LV function.  As noted, she has had episodes  of exhaustion every several weeks to several months since her initial event.  Given her normal work-up, I suspect that this is residual from her non-STEMI and will likely be a chronic symptom for her.  There has not really been any significant change.  Continue current management and follow-up in 6 months.  2. Palpitations No significant arrhythmias noted on monitor.  She did have 1 very brief atrial run of 4 beats.  There were no symptoms reported.  I offered her an as needed beta-blocker to use if she has increases in palpitations.  She would like to try this.  Start Propranolol 10 mg once daily prn palpitations.      Dispo:  Return in about 6 months (around 12/10/2020) for Routine follow up in 6 months with Dr. Burt Knack. .   Medication Adjustments/Labs and Tests Ordered: Current medicines are reviewed at length with the patient today.  Concerns regarding medicines are outlined above.  Tests Ordered: No orders of the defined types were placed in this encounter.  Medication Changes: Meds ordered this encounter  Medications  . propranolol (INDERAL) 10 MG tablet    Sig: Take 1 tablet (10 mg total) by mouth as needed. For palpitations.    Dispense:  15 tablet    Refill:  2    Signed, Richardson Dopp, PA-C  06/10/2020 6:07 PM    [facility] [address], [place], [place]  [address] Phone: [phone]; Fax: [phone]

## 2020-06-10 ENCOUNTER — Other Ambulatory Visit: Payer: Self-pay

## 2020-06-10 ENCOUNTER — Ambulatory Visit: Payer: BC Managed Care – PPO | Admitting: Physician Assistant

## 2020-06-10 ENCOUNTER — Encounter: Payer: Self-pay | Admitting: Physician Assistant

## 2020-06-10 VITALS — BP 118/76 | HR 78 | Ht 67.0 in | Wt 180.0 lb

## 2020-06-10 DIAGNOSIS — R002 Palpitations: Secondary | ICD-10-CM

## 2020-06-10 DIAGNOSIS — I2542 Coronary artery dissection: Secondary | ICD-10-CM | POA: Diagnosis not present

## 2020-06-10 MED ORDER — PROPRANOLOL HCL 10 MG PO TABS: 10.0000 mg | ORAL_TABLET | ORAL | 2 refills | Status: DC | PRN

## 2020-06-10 NOTE — Patient Instructions (Signed)
Medication Instructions:   START Propranolol one tablet by mouth ( 10 mg) as needed for palpitations.   *If you need a refill on your cardiac medications before your next appointment, please call your pharmacy*   Lab Work: None If you have labs (blood work) drawn today and your tests are completely normal, you will receive your results only by: Marland Kitchen MyChart Message (if you have MyChart) OR . A paper copy in the mail If you have any lab test that is abnormal or we need to change your treatment, we will call you to review the results.   Testing/Procedures: None   Follow-Up: At [facility], you and your health needs are our priority.  As part of our continuing mission to provide you with exceptional heart care, we have created designated Provider Care Teams.  These Care Teams include your primary Cardiologist (physician) and Advanced Practice Providers (APPs -  Physician Assistants and Nurse Practitioners) who all work together to provide you with the care you need, when you need it.  We recommend signing up for the patient portal called "MyChart".  Sign up information is provided on this After Visit Summary.  MyChart is used to connect with patients for Virtual Visits (Telemedicine).  Patients are able to view lab/test results, encounter notes, upcoming appointments, etc.  Non-urgent messages can be sent to your provider as well.   To learn more about what you can do with MyChart, go to [email or web address].    Your next appointment:   6 month(s) with Dr. Burt Knack on Wednesday, December 7 @ 10:00 am.  The format for your next appointment:   In Person  Provider:   Sherren Mocha, MD   Other Instructions -None

## 2020-09-02 ENCOUNTER — Other Ambulatory Visit: Payer: Self-pay

## 2020-09-02 ENCOUNTER — Ambulatory Visit (INDEPENDENT_AMBULATORY_CARE_PROVIDER_SITE_OTHER): Payer: BC Managed Care – PPO | Admitting: Obstetrics and Gynecology

## 2020-09-02 ENCOUNTER — Encounter: Payer: Self-pay | Admitting: Obstetrics and Gynecology

## 2020-09-02 VITALS — BP 120/84 | HR 86 | Ht 65.75 in | Wt 173.0 lb

## 2020-09-02 DIAGNOSIS — Z01419 Encounter for gynecological examination (general) (routine) without abnormal findings: Secondary | ICD-10-CM | POA: Diagnosis not present

## 2020-09-02 DIAGNOSIS — L900 Lichen sclerosus et atrophicus: Secondary | ICD-10-CM | POA: Diagnosis not present

## 2020-09-02 NOTE — Progress Notes (Signed)
55 y.o. G36P1001 Married White or Caucasian Not Hispanic or Latino female here for annual exam.  Mirena IUD removed in 5/21, no bleeding since then. No significant vasomotor symptoms, some vaginal dryness. Sexually active, no pain.  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 No symptoms.   No bowel or bladder c/o.   She has had an increase in migraines in the last 4 months. No increase in stress.    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 Had cardiac cath, didn't get a stent. Managed with medication. She is followed by Cardiology.  No LMP recorded. Patient is postmenopausal.          Sexually active: Yes.    The current method of family planning is post menopausal status.    Exercising: Yes.     Walking and hiking  Smoker:  no  Health Maintenance: Pap:  05/14/19 WNL HR HPV Neg  History of abnormal Pap:  no MMG:  05/30/20 density C Bi-rads 1 neg  BMD:   none  Colonoscopy: 10/11/2017 2 polyps f/u 10 years TDaP:  up to date per patient  Gardasil: n/a   reports that she has never smoked. She has never used smokeless tobacco. She reports current alcohol use. She reports that she does not use drugs. Drinks 2 drinks 3 nights a week. 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 Husband from [place], professor at [employer]. She has 2 kids (one adopted), one in [place], other in college in [place].   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  Carlean Purl, benign polyps, repeat in 10 yrs     Current Outpatient Medications  Medication Sig Dispense Refill   aspirin 81 MG tablet Take 1 tablet (81 mg total) by mouth daily. 30 tablet 0   propranolol (INDERAL) 10 MG tablet Take 1 tablet (10 mg total) by mouth as needed. For palpitations. 15 tablet 2   No current facility-administered medications for this visit.    Family History  Problem Relation Age of Onset   Mental retardation Mother    Cancer Father    Hyperlipidemia Father    Prostate cancer Father    Colon polyps Neg Hx    Colon cancer Neg Hx    Esophageal cancer Neg Hx    Rectal cancer Neg Hx    Stomach cancer Neg Hx     Review of Systems  All other systems reviewed and are negative.  Exam:   BP 120/84   Pulse 86   Ht 5' 5.75" (1.67 m)   Wt 173 lb (78.5 kg)   SpO2 100%   BMI 28.14 kg/m   Weight change: '@WEIGHTCHANGE'$ @ Height:   Height: 5' 5.75" (167 cm)  Ht Readings from Last 3 Encounters:  09/02/20 5' 5.75" (1.67 m)  06/10/20 '5\' 7"'$  (1.702 m)  04/14/20 '5\' 7"'$  (1.702 m)    General appearance: alert, cooperative  and appears stated age Head: Normocephalic, without obvious abnormality, atraumatic Neck: no adenopathy, supple, symmetrical, trachea midline and thyroid normal to inspection and palpation Lungs: clear to auscultation bilaterally Cardiovascular: regular rate and rhythm Breasts: normal appearance, no masses or tenderness Abdomen: soft, non-tender; non distended,  no masses,  no organomegaly Extremities: extremities normal, atraumatic, no cyanosis or edema Skin: Skin color, texture, turgor normal. No rashes or lesions Lymph nodes: Cervical, supraclavicular, and axillary nodes normal. No abnormal inguinal nodes palpated Neurologic: Grossly normal   Pelvic: External genitalia:  no lesions, mild whitening under the clitoris, in between the labia minora              Urethra:  normal appearing urethra with no masses, tenderness or lesions               Bartholins and Skenes: normal                 Vagina: mildly atrophic appearing vagina with normal color and discharge, no lesions              Cervix: no lesions               Bimanual Exam:  Uterus:  normal size, contour, position, consistency, mobility, non-tender              Adnexa: no mass, fullness, tenderness               Rectovaginal: Confirms               Anus:  normal sphincter tone, no lesions  Royal Hawthorn, CMA chaperoned for the exam.  1. Well woman exam Discussed breast self exam Discussed calcium and vit D intake Mammogram and colonoscopy UTD Labs with primary  2. Lichen sclerosus Mild and stable

## 2020-09-02 NOTE — Patient Instructions (Signed)

## 2020-12-09 ENCOUNTER — Encounter: Payer: Self-pay | Admitting: Cardiovascular Disease

## 2020-12-09 ENCOUNTER — Ambulatory Visit: Payer: BC Managed Care – PPO | Admitting: Cardiovascular Disease

## 2020-12-09 ENCOUNTER — Other Ambulatory Visit: Payer: Self-pay

## 2020-12-09 VITALS — BP 126/64 | HR 78 | Ht 67.0 in | Wt 174.8 lb

## 2020-12-09 DIAGNOSIS — R002 Palpitations: Secondary | ICD-10-CM | POA: Diagnosis not present

## 2020-12-09 DIAGNOSIS — I2542 Coronary artery dissection: Secondary | ICD-10-CM

## 2020-12-09 NOTE — Progress Notes (Signed)
Cardiology Office Note:    Date:  12/09/2020   ID:  [name], [name] [birth year], MRN [id number]  PCP:  Laurey Morale, MD   [facility] Providers Cardiologist:  Sherren Mocha, MD Cardiology APP:  Sharmon Revere     Referring MD: Laurey Morale, MD   Chief Complaint  Patient presents with   Coronary Artery Disease     History of Present Illness:    Tina Juarez is a 55 y.o. 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  She is doing well.  She has been seen a few times earlier this year with increased palpitations.  The patient underwent an event monitor that showed no significant arrhythmia.  She seems to be doing better now with few palpitations over the last 2 months.  She has not required any as needed use of propranolol.  She has not had any chest pain, chest pressure, or shortness of breath.  She has some questions about the omicron vaccine and risk of pericarditis.  We discussed this today.  Otherwise no concerns at this time.  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 Carlean Purl, benign polyps, repeat in 10 yrs     Current Medications: Current Meds  Medication Sig   aspirin 81 MG tablet Take 1 tablet (81 mg total) by mouth daily.      Allergies:   Patient has no known allergies.   Social History   Socioeconomic History   Marital status: Married    Spouse name: Not on file   Number of children: Not on file   Years of education: Not on file   Highest education level: Not on file  Occupational History   Not on file  Tobacco Use   Smoking status: Never   Smokeless tobacco: Never  Vaping Use   Vaping Use: Never used  Substance and Sexual Activity   Alcohol use: Yes    Comment: weekly - 5 a week    Drug use: Never   Sexual activity: Yes    Birth control/protection: I.U.D.  Other Topics Concern   Not on file  Social History Narrative   Not on file   Social Determinants of Health   Financial Resource Strain: Not on file  Food Insecurity: Not on file  Transportation Needs: Not on file  Physical Activity: Not on file  Stress: Not on file  Social Connections: Not on file     Family History: The patient's family history includes Cancer in her father; Hyperlipidemia in her father; Mental retardation in her mother; Prostate cancer in her father. There is no history of Colon polyps, Colon cancer, Esophageal cancer, Rectal cancer, or Stomach cancer.  ROS:  Please see the history of present illness.    All other systems reviewed and are negative.  EKGs/Labs/Other Studies Reviewed:    The following studies were reviewed today: Echo 05/14/2020 IMPRESSIONS     1. Left ventricular ejection fraction, by estimation, is 55 to 60%. Left  ventricular ejection fraction by 3D volume is 59 %. The left ventricle has  normal function. The left ventricle has no regional wall motion  abnormalities. Left ventricular diastolic   parameters were normal. The average left ventricular global longitudinal  strain is -25.3 %.   2. Right ventricular systolic function is normal. The right ventricular  size is normal. There is normal pulmonary artery systolic pressure. The  estimated right ventricular systolic pressure is 19.1  mmHg.   3. The mitral valve is normal in structure. Trivial mitral valve  regurgitation. No evidence of mitral stenosis.   4. The aortic valve is tricuspid. Aortic valve regurgitation is not  visualized. No aortic stenosis is present.   5. The inferior vena cava is normal in size with greater than 50%  respiratory variability, suggesting right atrial pressure of 3 mmHg.   Comparison(s): Report only 01/29/15.   EKG:  EKG is not ordered today.    Recent Labs: 02/27/2020: ALT 15; BUN 16; Creatinine, Ser 0.78; Hemoglobin 13.5; Platelets 256.0; Potassium 4.6; Sodium 139; TSH 0.82  Recent Lipid Panel    Component Value Date/Time   CHOL 171 02/27/2020 0942   CHOL 181 05/13/2019 1049   TRIG 63.0 02/27/2020 0942   HDL 64.00 02/27/2020 0942   HDL 63 05/13/2019 1049   CHOLHDL 3 02/27/2020 0942   VLDL 12.6 02/27/2020 0942   LDLCALC 94 02/27/2020 0942   LDLCALC 100 (H) 05/13/2019 1049     Risk Assessment/Calculations:           Physical Exam:    VS:  BP 126/64   Pulse 78   Ht 5\' 7"  (1.702 m)   Wt 174 lb 12.8 oz (79.3 kg)   SpO2 98%   BMI 27.38 kg/m     Wt Readings from Last 3 Encounters:  12/09/20 174 lb 12.8 oz (79.3 kg)  09/02/20 173 lb (78.5 kg)  06/10/20 180 lb (81.6 kg)     GEN:  Well nourished, well developed in no acute distress HEENT: Normal NECK: No JVD; No carotid bruits LYMPHATICS: No lymphadenopathy CARDIAC: RRR, no murmurs, rubs, gallops RESPIRATORY:  Clear to auscultation without rales, wheezing or rhonchi  ABDOMEN: Soft, non-tender, non-distended MUSCULOSKELETAL:  No edema; No deformity  SKIN: Warm and dry NEUROLOGIC:  Alert and oriented x 3 PSYCHIATRIC:  Normal affect   ASSESSMENT:    1. Spontaneous dissection of coronary artery   2. Palpitations    PLAN:    In order of problems listed above:  The patient has done very well, now 5 years out from her spontaneous coronary artery dissection.  She will continue aspirin 81 mg daily.  We had a lengthy  discussion today about the natural history of spontaneous coronary artery dissection.  I am hopeful that her risk of recurrent events will be very low now that she is 5 years out.  She may be at lower risk as she is now postmenopausal.  We will continue observation.  I do not think she needs to carry nitroglycerin.  She understands that if she were to have recurrent angina, she should seek immediate medical attention. Symptoms improved.  She has not required as needed use of propranolol.  I reviewed  her event monitor today which showed no significant arrhythmia.           Medication Adjustments/Labs and Tests Ordered: Current medicines are reviewed at length with the patient today.  Concerns regarding medicines are outlined above.  No orders of the defined types were placed in this encounter.  No orders of the defined types were placed in this encounter.   Patient Instructions  Medication Instructions:  Your physician recommends that you continue on your current medications as directed. Please refer to the Current Medication list given to you today.  *If you need a refill on your cardiac medications before your next appointment, please call your pharmacy*  Follow-Up: At [facility], you and your health needs are our priority.  As part of our continuing mission to provide you with exceptional heart care, we have created designated Provider Care Teams.  These Care Teams include your primary Cardiologist (physician) and Advanced Practice Providers (APPs -  Physician Assistants and Nurse Practitioners) who all work together to provide you with the care you need, when you need it.   Your next appointment:   1 year(s)  The format for your next appointment:   In Person  Provider:   Sherren Mocha, MD      Signed, Sherren Mocha, MD  12/09/2020 1:39 PM    [place]

## 2020-12-09 NOTE — Patient Instructions (Signed)
Medication Instructions:  Your physician recommends that you continue on your current medications as directed. Please refer to the Current Medication list given to you today.  *If you need a refill on your cardiac medications before your next appointment, please call your pharmacy*  Follow-Up: At [facility], you and your health needs are our priority.  As part of our continuing mission to provide you with exceptional heart care, we have created designated Provider Care Teams.  These Care Teams include your primary Cardiologist (physician) and Advanced Practice Providers (APPs -  Physician Assistants and Nurse Practitioners) who all work together to provide you with the care you need, when you need it.   Your next appointment:   1 year(s)  The format for your next appointment:   In Person  Provider:   Sherren Mocha, MD

## 2021-05-11 ENCOUNTER — Other Ambulatory Visit: Payer: Self-pay | Admitting: Family Medicine

## 2021-05-11 DIAGNOSIS — Z1231 Encounter for screening mammogram for malignant neoplasm of breast: Secondary | ICD-10-CM

## 2021-06-02 ENCOUNTER — Ambulatory Visit: Payer: BC Managed Care – PPO

## 2021-06-15 ENCOUNTER — Ambulatory Visit
Admission: RE | Admit: 2021-06-15 | Discharge: 2021-06-15 | Disposition: A | Discharge status: Home / Self Care | Payer: BC Managed Care – PPO | Source: Ambulatory Visit | Attending: Family Medicine | Admitting: Family Medicine

## 2021-06-15 DIAGNOSIS — Z1231 Encounter for screening mammogram for malignant neoplasm of breast: Secondary | ICD-10-CM

## 2021-06-16 ENCOUNTER — Ambulatory Visit: Payer: BC Managed Care – PPO

## 2021-07-30 ENCOUNTER — Encounter: Payer: Self-pay | Admitting: Family Medicine

## 2021-07-30 ENCOUNTER — Ambulatory Visit: Payer: BC Managed Care – PPO | Admitting: Family Medicine

## 2021-07-30 VITALS — BP 110/78 | HR 78 | Temp 99.2°F | Wt 174.0 lb

## 2021-07-30 DIAGNOSIS — H60542 Acute eczematoid otitis externa, left ear: Secondary | ICD-10-CM | POA: Diagnosis not present

## 2021-07-30 MED ORDER — TRIAMCINOLONE ACETONIDE 0.1 % EX CREA: 1.0000 | TOPICAL_CREAM | Freq: Two times a day (BID) | CUTANEOUS | 5 refills | Status: DC

## 2021-07-30 NOTE — Progress Notes (Signed)
   Subjective:    Patient ID: Tina Juarez, female    DOB: [birth year], 56 y.o.   MRN: [id number]  HPI Here for 3 days of itching in the left ear canal and some leakage of a clear fluid. No pain.    Review of Systems  Constitutional: Negative.   HENT:  Positive for ear discharge. Negative for congestion, ear pain, hearing loss and sinus pressure.   Eyes: Negative.   Respiratory: Negative.         Objective:   Physical Exam Constitutional:      Appearance: Normal appearance.  HENT:     Right Ear: Tympanic membrane, ear canal and external ear normal.     Left Ear: Tympanic membrane normal.     Ears:     Comments: The left ear lobe and the outermost portion of the canal has some red, scaly areas  Neurological:     Mental Status: She is alert.           Assessment & Plan:  Eczema, treat with Triamcinolone cream as needed.  Alysia Penna, MD

## 2021-09-07 ENCOUNTER — Ambulatory Visit: Payer: BC Managed Care – PPO | Admitting: Obstetrics and Gynecology

## 2021-09-16 NOTE — Progress Notes (Unsigned)
56 y.o. G44P1001 Married White or Caucasian Not Hispanic or Latino female here for annual exam.  No vaginal bleeding. No dyspareunia.    H/O lichen sclerosis. No symptoms.   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 Had cardiac cath, didn't get a stent. Managed with medication. She is followed by Cardiology, doing well.  No LMP recorded. Patient is postmenopausal.          Sexually active: Yes.    The current method of family planning is post menopausal status.    Exercising: Yes.     Walking, hiking  Smoker:  no  Health Maintenance: Pap:  05/14/19 WNL HR HPV Neg  History of abnormal Pap:  no MMG:  06/15/21 density B BI-rads 1 neg  BMD:   n/a Colonoscopy:  10/11/2017 2 polyps f/u 10 years TDaP: 02/27/20 Gardasil: n/a   reports that she has never smoked. She has never used smokeless tobacco. She reports current alcohol use. She reports that she does not use drugs. Drinks 6 glasses of wine a week. 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 Husband from [place], professor at [employer]. She has 2 kids (one adopted), son in [place], daughter is senior in college in [place] (wants to go to Sports coach school).   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 Carlean Purl, benign polyps, repeat in 10 yrs     Current Outpatient Medications  Medication Sig Dispense Refill   aspirin 81 MG tablet Take 1 tablet (81 mg total) by mouth daily. 30 tablet 0   propranolol (INDERAL) 10  MG tablet Take 1 tablet (10 mg total) by mouth as needed. For palpitations. 15 tablet 2   No current facility-administered medications for this visit.    Family History  Problem Relation Age of Onset   Mental retardation Mother    Cancer Father    Hyperlipidemia Father    Prostate cancer Father    Breast cancer Paternal Grandmother        63s   Colon polyps Neg Hx    Colon cancer Neg Hx    Esophageal cancer Neg Hx    Rectal cancer Neg Hx    Stomach cancer Neg Hx   Mom with hip fracture.   Review of Systems  All other systems reviewed and are negative.   Exam:   BP 118/74   Pulse 63   Ht '5\' 7"'$  (1.702 m)   Wt 176 lb (79.8 kg)   SpO2 100%   BMI 27.57 kg/m   Weight change: '@WEIGHTCHANGE'$ @ Height:   Height: '5\' 7"'$  (170.2 cm)  Ht Readings from Last 3 Encounters:  09/22/21 '5\' 7"'$  (1.702 m)  12/09/20 '5\' 7"'$  (1.702 m)  09/02/20 5' 5.75" (1.67 m)    General appearance: alert, cooperative and appears stated age Head: Normocephalic, without obvious abnormality, atraumatic Neck: no adenopathy, supple, symmetrical, trachea midline and thyroid normal to inspection and  palpation Lungs: clear to auscultation bilaterally Cardiovascular: regular rate and rhythm Breasts: normal appearance, no masses or tenderness Abdomen: soft, non-tender; non distended,  no masses,  no organomegaly Extremities: extremities normal, atraumatic, no cyanosis or edema Skin: Skin color, texture, turgor normal. No rashes or lesions Lymph nodes: Cervical, supraclavicular, and axillary nodes normal. No abnormal inguinal nodes palpated Neurologic: Grossly normal   Pelvic: External genitalia:  mild whitening under the clitoris, between the labia minora. Stable              Urethra:  normal appearing urethra with no masses, tenderness or lesions              Bartholins and Skenes: normal                 Vagina: normal appearing vagina with normal color and discharge, no lesions              Cervix: no lesions                Bimanual Exam:  Uterus:  normal size, contour, position, consistency, mobility, non-tender              Adnexa: no mass, fullness, tenderness               Rectovaginal: Confirms               Anus:  normal sphincter tone, no lesions   1. Well woman exam Discussed breast self exam Discussed calcium and vit D intake Mammogram and colonoscopy UTD No pap this year   2. Family history of fracture of hip in mother - DG Bone Density; Future  3. Hypoestrogenism - DG Bone Density; Future  4. Lichen sclerosus Stable, no symptoms

## 2021-09-22 ENCOUNTER — Ambulatory Visit (INDEPENDENT_AMBULATORY_CARE_PROVIDER_SITE_OTHER): Payer: BC Managed Care – PPO | Admitting: Obstetrics and Gynecology

## 2021-09-22 ENCOUNTER — Encounter: Payer: Self-pay | Admitting: Obstetrics and Gynecology

## 2021-09-22 VITALS — BP 118/74 | HR 63 | Ht 67.0 in | Wt 176.0 lb

## 2021-09-22 DIAGNOSIS — Z8489 Family history of other specified conditions: Secondary | ICD-10-CM

## 2021-09-22 DIAGNOSIS — E2839 Other primary ovarian failure: Secondary | ICD-10-CM

## 2021-09-22 DIAGNOSIS — Z01419 Encounter for gynecological examination (general) (routine) without abnormal findings: Secondary | ICD-10-CM | POA: Diagnosis not present

## 2021-09-22 DIAGNOSIS — L900 Lichen sclerosus et atrophicus: Secondary | ICD-10-CM | POA: Diagnosis not present

## 2021-09-22 NOTE — Patient Instructions (Signed)

## 2021-10-22 ENCOUNTER — Ambulatory Visit (INDEPENDENT_AMBULATORY_CARE_PROVIDER_SITE_OTHER): Payer: BC Managed Care – PPO | Admitting: Family Medicine

## 2021-10-22 ENCOUNTER — Encounter: Payer: Self-pay | Admitting: Family Medicine

## 2021-10-22 VITALS — BP 126/80 | HR 71 | Temp 99.7°F | Ht 67.0 in | Wt 178.0 lb

## 2021-10-22 DIAGNOSIS — L03113 Cellulitis of right upper limb: Secondary | ICD-10-CM | POA: Diagnosis not present

## 2021-10-22 DIAGNOSIS — E663 Overweight: Secondary | ICD-10-CM | POA: Diagnosis not present

## 2021-10-22 DIAGNOSIS — Z0000 Encounter for general adult medical examination without abnormal findings: Secondary | ICD-10-CM

## 2021-10-22 MED ORDER — PHENTERMINE HCL 30 MG PO CAPS: 30.0000 mg | ORAL_CAPSULE | ORAL | 1 refills | Status: DC

## 2021-10-22 MED ORDER — CEPHALEXIN 500 MG PO CAPS: 500.0000 mg | ORAL_CAPSULE | Freq: Three times a day (TID) | ORAL | 0 refills | Status: AC

## 2021-10-22 NOTE — Progress Notes (Signed)
Subjective:    Patient ID: Tina Juarez, female    DOB: 09-07-1965, 56 y.o.   MRN: [id number]  HPI Here for a well exam and to discuss a few issues. She has been doing well in general. Then about 2 weeks ago she developed  rash on the right arm that appeared to be from poison ivy. This itched for a week or so but now it is slightly painful. The skin has also turned a red color. The other issue is her weight. She eats a very healthy diet and exercises regularly, but she cannot lose weight.    Review of Systems  Constitutional: Negative.   HENT: Negative.    Eyes: Negative.   Respiratory: Negative.    Cardiovascular: Negative.   Gastrointestinal: Negative.   Genitourinary:  Negative for decreased urine volume, difficulty urinating, dyspareunia, dysuria, enuresis, flank pain, frequency, hematuria, pelvic pain and urgency.  Musculoskeletal: Negative.   Skin:  Positive for rash.  Neurological: Negative.  Negative for headaches.  Psychiatric/Behavioral: Negative.         Objective:   Physical Exam Constitutional:      General: She is not in acute distress.    Appearance: She is well-developed. She is obese.  HENT:     Head: Normocephalic and atraumatic.     Right Ear: External ear normal.     Left Ear: External ear normal.     Nose: Nose normal.     Mouth/Throat:     Pharynx: No oropharyngeal exudate.  Eyes:     General: No scleral icterus.    Conjunctiva/sclera: Conjunctivae normal.     Pupils: Pupils are equal, round, and reactive to light.  Neck:     Thyroid: No thyromegaly.     Vascular: No JVD.  Cardiovascular:     Rate and Rhythm: Normal rate and regular rhythm.     Heart sounds: Normal heart sounds. No murmur heard.    No friction rub. No gallop.  Pulmonary:     Effort: Pulmonary effort is normal. No respiratory distress.     Breath sounds: Normal breath sounds. No wheezing or rales.  Chest:     Chest wall: No tenderness.  Abdominal:     General:  Bowel sounds are normal. There is no distension.     Palpations: Abdomen is soft. There is no mass.     Tenderness: There is no abdominal tenderness. There is no guarding or rebound.  Musculoskeletal:        General: No tenderness. Normal range of motion.     Cervical back: Normal range of motion and neck supple.  Lymphadenopathy:     Cervical: No cervical adenopathy.  Skin:    General: Skin is warm and dry.     Findings: No erythema.     Comments: There are scattered areas with red vesicles and papules on the right arm, and these are all surrounded by a zone of erythema. These areas are warm but not tender   Neurological:     Mental Status: She is alert and oriented to person, place, and time.     Cranial Nerves: No cranial nerve deficit.     Motor: No abnormal muscle tone.     Coordination: Coordination normal.     Deep Tendon Reflexes: Reflexes are normal and symmetric. Reflexes normal.  Psychiatric:        Behavior: Behavior normal.        Thought Content: Thought content normal.  Judgment: Judgment normal.           Assessment & Plan:  Well exam. We discussed diet and exercise. Get fasting labs. She has a contact dermatitis on the arm which now has a secondary cellulitis, so we will treat this with 10 days of Keflex. To help with weight management, we will refer her to Nutrition and she will try Phentermine 30 mg every morning.  Alysia Penna, MD

## 2021-10-27 ENCOUNTER — Other Ambulatory Visit (INDEPENDENT_AMBULATORY_CARE_PROVIDER_SITE_OTHER): Payer: BC Managed Care – PPO

## 2021-10-27 DIAGNOSIS — Z0000 Encounter for general adult medical examination without abnormal findings: Secondary | ICD-10-CM | POA: Diagnosis not present

## 2021-10-27 LAB — CBC WITH DIFFERENTIAL/PLATELET
Basophils Absolute: 0.1 10*3/uL (ref 0.0–0.1)
Basophils Relative: 1 % (ref 0.0–3.0)
Eosinophils Absolute: 0.2 10*3/uL (ref 0.0–0.7)
Eosinophils Relative: 5 % (ref 0.0–5.0)
HCT: 42 % (ref 36.0–46.0)
Hemoglobin: 13.9 g/dL (ref 12.0–15.0)
Lymphocytes Relative: 29.9 % (ref 12.0–46.0)
Lymphs Abs: 1.4 10*3/uL (ref 0.7–4.0)
MCHC: 33.1 g/dL (ref 30.0–36.0)
MCV: 94.6 fl (ref 78.0–100.0)
Monocytes Absolute: 0.4 10*3/uL (ref 0.1–1.0)
Monocytes Relative: 9.1 % (ref 3.0–12.0)
Neutro Abs: 2.7 10*3/uL (ref 1.4–7.7)
Neutrophils Relative %: 55 % (ref 43.0–77.0)
Platelets: 267 10*3/uL (ref 150.0–400.0)
RBC: 4.44 Mil/uL (ref 3.87–5.11)
RDW: 13.9 % (ref 11.5–15.5)
WBC: 4.8 10*3/uL (ref 4.0–10.5)

## 2021-10-27 LAB — LIPID PANEL
Cholesterol: 176 mg/dL (ref 0–200)
HDL: 61.8 mg/dL (ref >39.00)
LDL Cholesterol: 102 mg/dL — ABNORMAL HIGH (ref 0–99)
NonHDL: 113.73
Total CHOL/HDL Ratio: 3
Triglycerides: 61 mg/dL (ref 0.0–149.0)
VLDL: 12.2 mg/dL (ref 0.0–40.0)

## 2021-10-27 LAB — URINALYSIS, ROUTINE W REFLEX MICROSCOPIC
Bilirubin Urine: NEGATIVE
Hgb urine dipstick: NEGATIVE
Ketones, ur: NEGATIVE
Nitrite: NEGATIVE
RBC / HPF: NONE SEEN (ref 0–?)
Specific Gravity, Urine: 1.015 (ref 1.000–1.030)
Total Protein, Urine: NEGATIVE
Urine Glucose: NEGATIVE
Urobilinogen, UA: 0.2 (ref 0.0–1.0)
pH: 7.5 (ref 5.0–8.0)

## 2021-10-27 LAB — BASIC METABOLIC PANEL
BUN: 15 mg/dL (ref 6–23)
CO2: 31 mEq/L (ref 19–32)
Calcium: 9.3 mg/dL (ref 8.4–10.5)
Chloride: 102 mEq/L (ref 96–112)
Creatinine, Ser: 0.73 mg/dL (ref 0.40–1.20)
GFR: 91.48 mL/min (ref >60.00)
Glucose, Bld: 95 mg/dL (ref 70–99)
Potassium: 4.2 mEq/L (ref 3.5–5.1)
Sodium: 138 mEq/L (ref 135–145)

## 2021-10-27 LAB — HEPATIC FUNCTION PANEL
ALT: 17 U/L (ref 0–35)
AST: 17 U/L (ref 0–37)
Albumin: 4.2 g/dL (ref 3.5–5.2)
Alkaline Phosphatase: 82 U/L (ref 39–117)
Bilirubin, Direct: 0.1 mg/dL (ref 0.0–0.3)
Total Bilirubin: 0.5 mg/dL (ref 0.2–1.2)
Total Protein: 7.2 g/dL (ref 6.0–8.3)

## 2021-10-27 LAB — HEMOGLOBIN A1C: Hgb A1c MFr Bld: 5.8 % (ref 4.6–6.5)

## 2021-10-27 LAB — TSH: TSH: 0.79 u[IU]/mL (ref 0.35–5.50)

## 2021-11-02 ENCOUNTER — Ambulatory Visit (INDEPENDENT_AMBULATORY_CARE_PROVIDER_SITE_OTHER): Payer: BC Managed Care – PPO

## 2021-11-02 DIAGNOSIS — Z23 Encounter for immunization: Secondary | ICD-10-CM | POA: Diagnosis not present

## 2021-11-29 ENCOUNTER — Encounter: Payer: BC Managed Care – PPO | Attending: Family Medicine | Admitting: Dietician

## 2021-11-29 ENCOUNTER — Encounter: Payer: Self-pay | Admitting: Dietician

## 2021-11-29 VITALS — Ht 67.0 in

## 2021-11-29 DIAGNOSIS — E663 Overweight: Secondary | ICD-10-CM | POA: Insufficient documentation

## 2021-11-29 NOTE — Progress Notes (Signed)
Medical Nutrition Therapy  Appointment Start time:  69  Appointment End time:  1632  Primary concerns today: Pt states her weight has been creeping up over the last few years and she wants to lose 17lbs to be 160lbs. 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 Medications: reviewed Labs: reviewed- 10/27/21 A1c 5.8% Notable Signs/Symptoms: none Food Allergies: none  Lifestyle & Dietary Hx  Pt does the shopping and cooking and states her husband helps with cooking sometime.   [place] lived [place] and moved [place] to [place] 5 yr ago. She states she noticed her weight has slowly gone up up since then and states she feels that it is due to more processed foods in the [place] and different lifestyle.   Pt recently started working with a Engineer, maintenance. Pt states she's cut down on red meat, snacking, and diet coke, which she states she drank daily for 25 years.   Pt states she would like to lose 17lbs. She states she was 177lbs and wants to be at 160lbs.   Pt walks for 30-45 minutes daily and hikes on weekends, she also goes to pilates 2x/wk.   Pt was unaware of prediabetes diagnosis.   Pt states her mom had disordered eating patterns.   Pt drinks red wine 2-3 nights weekly.   Estimated daily fluid intake: 32-48 oz Supplements: none Sleep: 8 hours Stress / self-care: moderate stress Current average weekly physical activity: Walks 30-45 min 6x/wk, pilates 2x/wk, hike on weekend.   24-Hr Dietary Recall First Meal: cheerios with fruit Snack: none OR half apple Second Meal: sandwich with ham and cucumber OR  salad with chicken or edamame OR piece of toast and banana Snack: carrot sticks, crackers and cheese Third Meal: 2 pieces pizza Snack: yogurt bar Beverages: coffee, wine 2-3x/wk, occasional stevia soda, water  NUTRITION DIAGNOSIS  NB-1.1 Food and nutrition-related knowledge deficit As related to lack of prior nutrition education.  As evidenced by pt report and diet history.   NUTRITION INTERVENTION  Nutrition education (E-1) on the following topics:  Prediabetes A1c and goals Effects of physical activity on blood glucose Importance of adequate hydration MyPlate Building balanced meals and snacks Functions of fiber in the body Saturated fat and blood cholesterol Consistent meal times Importance of protein and carbohydrate at meal times Whole vs refined grains Glycogen stores and gluconeogenesis  Handouts Provided Include  Dish Up a Healthy Meal A1c chart  Learning Style & Readiness for Change Teaching method utilized: Visual & Auditory  Demonstrated degree of understanding via: Teach Back  Barriers to learning/adherence to lifestyle change: none  Goals Established by Pt Aim for 150 minutes of physical activity weekly.  Eat more Non-Starchy Vegetables and Fruits. Goal: aim to make 1/2 of your plate vegetables and/or fruit at least 2x/day  Minimize added sugars and refined grains. Rethink what you drink. Choose beverages without added sugar. Look for 0 carbs on the label.  Choose whole foods over processed.  Make simple meals at home more often than eating out.  Aim to eat within 1-2 hours of waking up and every 3-5 hours following.  Goal: Aim for 64 oz water daily.    MONITORING & EVALUATION Dietary intake, weekly physical activity, and follow up in 6-8 weeks.  Next Steps  Patient is to call for questions.

## 2021-11-29 NOTE — Patient Instructions (Addendum)
Aim for 150 minutes of physical activity weekly.  Eat more Non-Starchy Vegetables and Fruits. Goal: aim to make 1/2 of your plate vegetables and/or fruit at least 2x/day  Minimize added sugars and refined grains. Rethink what you drink. Choose beverages without added sugar. Look for 0 carbs on the label.  Choose whole foods over processed.  Make simple meals at home more often than eating out.  Aim to eat within 1-2 hours of waking up and every 3-5 hours following.   Goal: Aim for 64 oz water daily.  Tips for increasing water intake: -Set a goal: Fill a bottle and drink it by lunch, drink another by dinner -Keep a glass of water by your bedside so that you're encouraged to drink it upon waking -Carry a re-usable, re-fillable water bottle -Add fruit such as lemon, lime, berries, or cucumbers to your water -Try sparkling water -Make herbal tea and drink it hot or iced  Recipe resources [email or web address]

## 2021-12-07 ENCOUNTER — Encounter: Payer: Self-pay | Admitting: Family Medicine

## 2021-12-07 MED ORDER — PHENTERMINE HCL 15 MG PO CAPS: 15.0000 mg | ORAL_CAPSULE | ORAL | 5 refills | Status: DC

## 2021-12-07 NOTE — Telephone Encounter (Signed)
I sent in the 15 mg dose

## 2021-12-15 ENCOUNTER — Encounter: Payer: Self-pay | Admitting: Cardiovascular Disease

## 2021-12-19 ENCOUNTER — Emergency Department (HOSPITAL_COMMUNITY): Payer: BC Managed Care – PPO

## 2021-12-19 ENCOUNTER — Inpatient Hospital Stay (HOSPITAL_COMMUNITY)
Admission: EM | Admit: 2021-12-19 | Discharge: 2021-12-21 | DRG: 282 | Disposition: A | Discharge status: Home / Self Care | Payer: BC Managed Care – PPO | Attending: Internal Medicine | Admitting: Internal Medicine

## 2021-12-19 ENCOUNTER — Encounter (HOSPITAL_COMMUNITY): Payer: Self-pay

## 2021-12-19 ENCOUNTER — Other Ambulatory Visit: Payer: Self-pay

## 2021-12-19 DIAGNOSIS — I2542 Coronary artery dissection: Secondary | ICD-10-CM

## 2021-12-19 DIAGNOSIS — Z9842 Cataract extraction status, left eye: Secondary | ICD-10-CM | POA: Diagnosis not present

## 2021-12-19 DIAGNOSIS — R931 Abnormal findings on diagnostic imaging of heart and coronary circulation: Secondary | ICD-10-CM

## 2021-12-19 DIAGNOSIS — I2510 Atherosclerotic heart disease of native coronary artery without angina pectoris: Secondary | ICD-10-CM | POA: Diagnosis not present

## 2021-12-19 DIAGNOSIS — Z9841 Cataract extraction status, right eye: Secondary | ICD-10-CM

## 2021-12-19 DIAGNOSIS — E785 Hyperlipidemia, unspecified: Secondary | ICD-10-CM | POA: Diagnosis present

## 2021-12-19 DIAGNOSIS — Z79899 Other long term (current) drug therapy: Secondary | ICD-10-CM | POA: Diagnosis not present

## 2021-12-19 DIAGNOSIS — Z86010 Personal history of colonic polyps: Secondary | ICD-10-CM | POA: Diagnosis not present

## 2021-12-19 DIAGNOSIS — E782 Mixed hyperlipidemia: Secondary | ICD-10-CM | POA: Diagnosis not present

## 2021-12-19 DIAGNOSIS — R7989 Other specified abnormal findings of blood chemistry: Secondary | ICD-10-CM

## 2021-12-19 DIAGNOSIS — E876 Hypokalemia: Secondary | ICD-10-CM | POA: Diagnosis present

## 2021-12-19 DIAGNOSIS — I252 Old myocardial infarction: Secondary | ICD-10-CM | POA: Diagnosis not present

## 2021-12-19 DIAGNOSIS — Z7982 Long term (current) use of aspirin: Secondary | ICD-10-CM | POA: Diagnosis not present

## 2021-12-19 DIAGNOSIS — I214 Non-ST elevation (NSTEMI) myocardial infarction: Secondary | ICD-10-CM | POA: Diagnosis not present

## 2021-12-19 LAB — BASIC METABOLIC PANEL
Anion gap: 11 (ref 5–15)
BUN: 16 mg/dL (ref 6–20)
CO2: 24 mmol/L (ref 22–32)
Calcium: 9.2 mg/dL (ref 8.9–10.3)
Chloride: 101 mmol/L (ref 98–111)
Creatinine, Ser: 0.86 mg/dL (ref 0.44–1.00)
GFR, Estimated: >60 mL/min (ref >60)
Glucose, Bld: 135 mg/dL — ABNORMAL HIGH (ref 70–99)
Potassium: 4 mmol/L (ref 3.5–5.1)
Sodium: 136 mmol/L (ref 135–145)

## 2021-12-19 LAB — TROPONIN I (HIGH SENSITIVITY)
Troponin I (High Sensitivity): 900 ng/L (ref <18)
Troponin I (High Sensitivity): 1740 ng/L (ref <18)

## 2021-12-19 LAB — CBC
HCT: 41.7 % (ref 36.0–46.0)
Hemoglobin: 14.1 g/dL (ref 12.0–15.0)
MCH: 31.7 pg (ref 26.0–34.0)
MCHC: 33.8 g/dL (ref 30.0–36.0)
MCV: 93.7 fL (ref 80.0–100.0)
Platelets: 282 10*3/uL (ref 150–400)
RBC: 4.45 MIL/uL (ref 3.87–5.11)
RDW: 13 % (ref 11.5–15.5)
WBC: 10.2 10*3/uL (ref 4.0–10.5)
nRBC: 0 % (ref 0.0–0.2)

## 2021-12-19 LAB — I-STAT BETA HCG BLOOD, ED (MC, WL, AP ONLY): I-stat hCG, quantitative: <5 m[IU]/mL (ref <5)

## 2021-12-19 MED ORDER — ACETAMINOPHEN 325 MG PO TABS
650.0000 mg | ORAL_TABLET | ORAL | Status: DC | PRN
  Administered 2021-12-19 – 2021-12-20 (×3): 650 mg via ORAL
  Filled 2021-12-19 (×3): qty 2

## 2021-12-19 MED ORDER — ONDANSETRON HCL 4 MG/2ML IJ SOLN: 4.0000 mg | Freq: Four times a day (QID) | INTRAMUSCULAR | Status: DC | PRN

## 2021-12-19 MED ORDER — ASPIRIN 81 MG PO TABS: 81.0000 mg | ORAL_TABLET | Freq: Every day | ORAL | Status: DC

## 2021-12-19 MED ORDER — HEPARIN (PORCINE) 25000 UT/250ML-% IV SOLN
1000.0000 [IU]/h | INTRAVENOUS | Status: DC
  Administered 2021-12-19: 1000 [IU]/h via INTRAVENOUS
  Filled 2021-12-19: qty 250

## 2021-12-19 MED ORDER — HEPARIN BOLUS VIA INFUSION
4000.0000 [IU] | Freq: Once | INTRAVENOUS | Status: AC
  Administered 2021-12-19: 4000 [IU] via INTRAVENOUS
  Filled 2021-12-19: qty 4000

## 2021-12-19 MED ORDER — METOPROLOL TARTRATE 12.5 MG HALF TABLET
12.5000 mg | ORAL_TABLET | Freq: Two times a day (BID) | ORAL | Status: DC
  Administered 2021-12-19 – 2021-12-20 (×3): 12.5 mg via ORAL
  Filled 2021-12-19 (×4): qty 1

## 2021-12-19 MED ORDER — ASPIRIN 81 MG PO TBEC
81.0000 mg | DELAYED_RELEASE_TABLET | Freq: Every day | ORAL | Status: DC
  Administered 2021-12-21: 81 mg via ORAL
  Filled 2021-12-19: qty 1

## 2021-12-19 MED ORDER — NITROGLYCERIN 0.4 MG SL SUBL: 0.4000 mg | SUBLINGUAL_TABLET | SUBLINGUAL | Status: DC | PRN

## 2021-12-19 NOTE — ED Provider Triage Note (Signed)
Emergency Medicine Provider Triage Evaluation Note  Tina Juarez , a 56 y.o. female  was evaluated in triage.  Pt complains of left sided chest pain, associated with dizziness, nausea that began earlier this morning, lasted for around 30 minutes, and was fairly severe at that time.  Patient describes the chest pain as pressure.  Patient reports that chest pain completely resolved until returning around an hour ago, although is less severe at this time.  Patient with previous history of spontaneous coronary artery dissection 6 years ago leading to MI at that time.  Patient with no history of connective tissue disorder.  Review of Systems  Positive: Chest pain, dizziness, nausea Negative: Shob, fever, chills  Physical Exam  BP (!) 144/89   Pulse 97   Temp 98.1 F (36.7 C)   Resp 15   Ht '5\' 7"'$  (1.702 m)   Wt 80.7 kg   SpO2 99%   BMI 27.86 kg/m  Gen:   Awake, no distress   Resp:  Normal effort  MSK:   Moves extremities without difficulty  Other:  Pulses equal bilaterally  Medical Decision Making  Medically screening exam initiated at 5:59 PM.  Appropriate orders placed.  Tina Juarez was informed that the remainder of the evaluation will be completed by another provider, this initial triage assessment does not replace that evaluation, and the importance of remaining in the ED until their evaluation is complete.  Workup initaited   Anselmo Pickler, [place] 12/19/21 1801

## 2021-12-19 NOTE — ED Provider Notes (Signed)
[facility] EMERGENCY DEPARTMENT Provider Note   CSN: [id number] Arrival date & time: 12/19/21  1723    History  Chief Complaint  Patient presents with   Chest Pain    Tina Juarez is a 56 y.o. female history of prior spontaneous coronary artery dissection, MI here for evaluation of chest pain.  Began approximately 1030 this morning, lasted 30 minutes and self resolved.  Came back around 430 today with associated nausea, lightheadedness.  Pain described as left, central described as pressure.  No current pain.  Did not sleep during the day as she has had some fatigue since her earlier episode of chest pain.  Typically able to complete her ADLs without any chest pain or DOE.  No pain or swelling to lower legs.  No back pain, abdominal pain.  No numbness or weakness.  No recent illnesses.    Follows with Dr. Burt Knack with [facility]  HPI     Home Medications Prior to Admission medications   Medication Sig Start Date End Date Taking? Authorizing Provider  aspirin 81 MG tablet Take 1 tablet (81 mg total) by mouth daily. 10/21/16   Laurey Morale, MD  phentermine 15 MG capsule Take 1 capsule (15 mg total) by mouth every morning. 12/07/21   Laurey Morale, MD  propranolol (INDERAL) 10 MG tablet Take 1 tablet (10 mg total) by mouth as needed. For palpitations. 06/10/20   Richardson Dopp T, PA-C      Allergies    Patient has no known allergies.    Review of Systems   Review of Systems  Constitutional: Negative.   HENT: Negative.    Respiratory: Negative.    Cardiovascular:  Positive for chest pain. Negative for palpitations and leg swelling.  Gastrointestinal: Negative.   Genitourinary: Negative.   Musculoskeletal: Negative.   Skin: Negative.   All other systems reviewed and are negative.   Physical Exam Updated Vital Signs BP (!) 145/87   Pulse 84   Temp 98.7 F (37.1 C)   Resp (!) 21   Ht '5\' 7"'$  (1.702 m)   Wt 80.7 kg   SpO2 100%   BMI  27.86 kg/m  Physical Exam Vitals and nursing note reviewed.  Constitutional:      General: She is not in acute distress.    Appearance: She is well-developed. She is not ill-appearing or diaphoretic.  HENT:     Head: Atraumatic.  Eyes:     Pupils: Pupils are equal, round, and reactive to light.  Cardiovascular:     Rate and Rhythm: Normal rate and regular rhythm.     Pulses:          Radial pulses are 2+ on the right side and 2+ on the left side.     Heart sounds: Normal heart sounds.  Pulmonary:     Effort: Pulmonary effort is normal. No respiratory distress.     Breath sounds: Normal breath sounds.  Chest:     Comments: Nontender Abdominal:     General: Bowel sounds are normal. There is no distension.     Palpations: Abdomen is soft.     Comments: Soft, nontender, no midline pulsatile abdominal mass  Musculoskeletal:        General: Normal range of motion.     Cervical back: Normal range of motion and neck supple.     Right lower leg: No tenderness. No edema.     Left lower leg: No tenderness. No edema.  Comments: No bony tenderness, full range of motion, compartment soft  Skin:    General: Skin is warm and dry.     Comments: No obvious rash or lesions  Neurological:     General: No focal deficit present.     Mental Status: She is alert and oriented to person, place, and time.    ED Results / Procedures / Treatments   Labs (all labs ordered are listed, but only abnormal results are displayed) Labs Reviewed  BASIC METABOLIC PANEL - Abnormal; Notable for the following components:      Result Value   Glucose, Bld 135 (*)    All other components within normal limits  TROPONIN I (HIGH SENSITIVITY) - Abnormal; Notable for the following components:   Troponin I (High Sensitivity) 900 (*)    All other components within normal limits  TROPONIN I (HIGH SENSITIVITY) - Abnormal; Notable for the following components:   Troponin I (High Sensitivity) 1,740 (*)    All other  components within normal limits  CBC  HEPARIN LEVEL (UNFRACTIONATED)  CBC  I-STAT BETA HCG BLOOD, ED ([facility], [facility], [facility] ONLY)    EKG EKG Interpretation  Date/Time:  Sunday December 19 2021 17:25:54 EST Ventricular Rate:  84 PR Interval:  158 QRS Duration: 76 QT Interval:  376 QTC Calculation: 444 R Axis:   -16 Text Interpretation: Normal sinus rhythm Low voltage QRS No previous ECGs available Confirmed by Octaviano Glow [phone]) on 12/19/2021 5:55:19 PM  Radiology DG Chest 2 View  Result Date: 12/19/2021 CLINICAL DATA:  Chest pain EXAM: CHEST - 2 VIEW COMPARISON:  None Available. FINDINGS: The heart size and mediastinal contours are within normal limits. No focal pulmonary opacity. No pleural effusion or pneumothorax. The visualized upper abdomen is unremarkable. No acute osseous abnormality. IMPRESSION: No acute cardiopulmonary abnormality. Electronically Signed   By: Beryle Flock M.D.   On: 12/19/2021 19:38    Procedures .Critical Care  Performed by: Nettie Elm, PA-C Authorized by: Nettie Elm, PA-C   Critical care provider statement:    Critical care time (minutes):  35   Critical care was necessary to treat or prevent imminent or life-threatening deterioration of the following conditions:  Cardiac failure   Critical care was time spent personally by me on the following activities:  Development of treatment plan with patient or surrogate, discussions with consultants, evaluation of patient's response to treatment, examination of patient, ordering and review of laboratory studies, ordering and review of radiographic studies, ordering and performing treatments and interventions, pulse oximetry, re-evaluation of patient's condition and review of old charts     Medications Ordered in ED Medications  heparin ADULT infusion 100 units/mL (25000 units/244m) (1,000 Units/hr Intravenous New Bag/Given 12/19/21 2139)  heparin bolus via infusion 4,000 Units (4,000 Units  Intravenous Bolus from Bag 12/19/21 2140)    ED Course/ Medical Decision Making/ A&P    56year old history of SCAD, MI here for evaluation of chest pain.  Waxing and waning, began approximately 1030 this morning with associated dizziness, nausea.  Returned approximately 1630's afternoon however not as severe.  Prior history of connective tissue disorders.  She is not anticoagulated.  No back pain, abd pain, numbness or weakness. No pain currently.  No clinical evidence of VTE on exam.  History and exam not consistent with AAA, aortic dissection.  Labs and imaging personally viewed and interpreted:  CBC without leukocytosis Metabolic panel glucose 1676Preg test negative Trop 900, repeat 1740 Chest x-ray that cardiomegaly, pulm edema,  pneumothorax, infiltrates EKG without ischemic changes  Discussed results with patient, family in room. Will discuss with cardiology prior to Heparin admin given hx.  CONSULT with Dr. Marcelle Smiling with Cardiology who rec admission to the Cards service.  We did discuss her history of spontaneous coronary artery dissection and heparin admin here in the ED.  He feels that is okay to start heparin in the ED.  Will talk with pharmacy about dosing.  Discussed plan with patient, family in room. Will need admission    The patient appears reasonably stabilized for admission considering the current resources, flow, and capabilities available in the ED at this time, and I doubt any other [facility] requiring further screening and/or treatment in the ED prior to admission.                           Medical Decision Making Amount and/or Complexity of Data Reviewed External Data Reviewed: labs, radiology, ECG and notes. Labs: ordered. Decision-making details documented in ED Course. Radiology: ordered and independent interpretation performed. Decision-making details documented in ED Course. ECG/medicine tests: ordered and independent interpretation performed. Decision-making  details documented in ED Course.  Risk OTC drugs. Prescription drug management. Parenteral controlled substances. Decision regarding hospitalization. Diagnosis or treatment significantly limited by social determinants of health.         Final Clinical Impression(s) / ED Diagnoses Final diagnoses:  NSTEMI (non-ST elevated myocardial infarction) [facility])    Rx / DC Orders ED Discharge Orders     None         Keyonta Madrid A, PA-C 12/19/21 2213    Isla Pence, MD 12/19/21 2326

## 2021-12-19 NOTE — H&P (Signed)
Cardiology Admission History and Physical   Patient ID: Tina Juarez MRN: [id number]; DOB: 22-Apr-1965   Admission date: 12/19/2021  PCP:  Laurey Morale, MD   [place] Prov[facility] Cardiologist:  Sherren Mocha, MD  Cardiology APP:  Liliane Shi, PA-C  { Click here to update MD or APP on Care Team, Refresh:1}     Chief Complaint:  ***  Patient Profile:   Tina Juarez is a 56 y.o. female with *** who is being seen 12/19/2021 for the evaluation of ***.  History of Present Illness:   Ms. 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 Carlean Purl, benign polyps, repeat in 10 yrs      Medications Prior to Admission: Prior to Admission medications   Medication Sig Start Date End Date Taking? Authorizing Provider  aspirin 81 MG tablet Take 1 tablet (81 mg total) by mouth daily. 10/21/16   Laurey Morale, MD  phentermine 15 MG capsule Take 1 capsule (15 mg total) by mouth every morning. 12/07/21   Laurey Morale, MD  propranolol (INDERAL) 10 MG tablet Take 1 tablet (10 mg total) by mouth as needed. For palpitations. 06/10/20   Liliane Shi, PA-C     Allergies:   No Known Allergies  Social History:   Social History   Socioeconomic History   Marital status: Married    Spouse name: Not on file   Number of children: Not on file   Years of education: Not on file   Highest education level: Not on file  Occupational History   Not on file  Tobacco Use    Smoking status: Never   Smokeless tobacco: Never  Vaping Use   Vaping Use: Never used  Substance and Sexual Activity   Alcohol use: Yes    Comment: weekly - 5 a week    Drug use: Never   Sexual activity: Yes    Birth control/protection: I.U.D.  Other Topics Concern   Not on file  Social History Narrative   Not on file   Social Determinants of Health   Financial Resource Strain: Not on file  Food Insecurity: Not on file  Transportation Needs: Not on file  Physical Activity: Not on file  Stress: Not on file  Social Connections: Not on file  Intimate Partner Violence: Not on file    Family History:  *** The patient's family history includes Breast cancer in her paternal grandmother; Cancer in her father; Hyperlipidemia in her father; Mental retardation in her mother; Prostate cancer in her father. There is no history of Colon polyps, Colon cancer, Esophageal cancer, Rectal cancer, or Stomach cancer.    ROS:  Please see the history of present illness.  ***All other ROS reviewed and negative.     Physical Exam/Data:   Vitals:   12/19/21 2100 12/19/21 2130 12/19/21 2200 12/19/21 2230  BP: (!) 145/87 (!) 142/87 139/83 129/77  Pulse: 84 93 83 82  Resp: (!) '21 16 11 13  '$ Temp:      SpO2: 100% 100% 100% 100%  Weight:      Height:       No intake or output data in the 24 hours ending 12/19/21 2312    12/19/2021    5:49 PM 10/22/2021    1:52 PM 09/22/2021    1:36 PM  Last 3 Weights  Weight (lbs) 177 lb 14.6 oz 178 lb 176 lb  Weight (kg) 80.7 kg 80.74 kg 79.833 kg     Body mass index is 27.86 kg/m.  General:  Well nourished, well developed, in no acute distress*** HEENT: normal Neck: no*** JVD Vascular: No carotid bruits; Distal pulses 2+ bilaterally   Cardiac:  normal S1, S2; RRR; no murmur *** Lungs:  clear to auscultation bilaterally, no wheezing, rhonchi or rales  Abd: soft, nontender, no hepatomegaly  Ext: no*** edema Musculoskeletal:  No deformities, BUE and  BLE strength normal and equal Skin: warm and dry  Neuro:  CNs 2-12 intact, no focal abnormalities noted Psych:  Normal affect    EKG:  The ECG that was done *** was personally reviewed and demonstrates ***  Relevant CV Studies: ***  Laboratory Data:  High Sensitivity Troponin:   Recent Labs  Lab 12/19/21 1802 12/19/21 2049  TROPONINIHS 900* 1,740*      Chemistry Recent Labs  Lab 12/19/21 1802  NA 136  K 4.0  CL 101  CO2 24  GLUCOSE 135*  BUN 16  CREATININE 0.86  CALCIUM 9.2  GFRNONAA >60  ANIONGAP 11    No results for input(s): "PROT", "ALBUMIN", "AST", "ALT", "ALKPHOS", "BILITOT" in the last 168 hours. Lipids No results for input(s): "CHOL", "TRIG", "HDL", "LABVLDL", "LDLCALC", "CHOLHDL" in the last 168 hours. Hematology Recent Labs  Lab 12/19/21 1802  WBC 10.2  RBC 4.45  HGB 14.1  HCT 41.7  MCV 93.7  MCH 31.7  MCHC 33.8  RDW 13.0  PLT 282   Thyroid No results for input(s): "TSH", "FREET4" in the last 168 hours. BNPNo results for input(s): "BNP", "PROBNP" in the last 168 hours.  DDimer No results for input(s): "DDIMER" in the last 168 hours.   Radiology/Studies:  DG Chest 2 View  Result Date: 12/19/2021 CLINICAL DATA:  Chest pain EXAM: CHEST - 2 VIEW COMPARISON:  None Available. FINDINGS: The heart size and mediastinal contours are within normal limits. No focal pulmonary opacity. No pleural effusion or pneumothorax. The visualized upper abdomen is unremarkable. No acute osseous abnormality. IMPRESSION: No acute cardiopulmonary abnormality. Electronically Signed   By: Beryle Flock M.D.   On: 12/19/2021 19:38     Assessment and Plan:   ***   Risk Assessment/Risk Scores:  {Complete the following score calculators/questions to meet required metrics.  Press F2:1}  {Is the patient being seen for unstable angina, ACS, NSTEMI or STEMI?:[id number]} {Does this patient have CHF or CHF symptoms?      :[id number]} {Does this patient have ATRIAL  FIBRILLATION?[phone]}   Severity of Illness: {Observation/Inpatient:21159}   For questions or updates, please contact [place] Please consult [email or web address] for contact info under     Signed, Doyne Keel, MD  12/19/2021 11:12 PM

## 2021-12-19 NOTE — ED Notes (Signed)
Patient transported to X-ray 

## 2021-12-19 NOTE — Progress Notes (Signed)
ANTICOAGULATION CONSULT NOTE  Pharmacy Consult for heparin Indication: chest pain/ACS  No Known Allergies  Patient Measurements: Height: '5\' 7"'$  (170.2 cm) Weight: 80.7 kg (177 lb 14.6 oz) IBW/kg (Calculated) : 61.6 Heparin Dosing Weight: 80kg  Vital Signs: Temp: 98.7 F (37.1 C) (12/17 1934) BP: 145/87 (12/17 2100) Pulse Rate: 84 (12/17 2100)  Labs: Recent Labs    12/19/21 1802  HGB 14.1  HCT 41.7  PLT 282  CREATININE 0.86  TROPONINIHS 900*    Estimated Creatinine Clearance: 78.8 mL/min (by C-G formula based on SCr of 0.86 mg/dL).   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 Hx of SCAD. CBC wnl on admit, no AC PTA. Pharmacy to dose heparin.  Goal of Therapy:  Heparin level 0.3-0.7 units/ml Monitor platelets by anticoagulation protocol: Yes   Plan:  -Heparin 4000 units x1 -Heparin 1000 units/hr -Check 6-hr heparin level -Monitor heparin level, CBC, S/Sx bleeding daily  Arrie Senate, PharmD, BCPS, [facility] Clinical Pharmacist Please check AMION for all [place] numbers 12/19/2021

## 2021-12-19 NOTE — ED Triage Notes (Signed)
Pt arrived POV w/ c/o CP onset 1030 lasting about 30 mins, CP came back at 1630 also c/o N, and lightheadedness. Initial CP was central L that felt like pressure. CP now central and not as intense.  Hx of spontaneous coronary artery dissection 6 years ago.

## 2021-12-19 NOTE — ED Notes (Signed)
Charge nurse notified for room assignment and treatment .

## 2021-12-20 ENCOUNTER — Telehealth: Payer: Self-pay | Admitting: Cardiovascular Disease

## 2021-12-20 ENCOUNTER — Inpatient Hospital Stay (HOSPITAL_COMMUNITY)
Admission: EM | Disposition: A | Discharge status: Home / Self Care | Payer: Self-pay | Source: Home / Self Care | Attending: Internal Medicine

## 2021-12-20 ENCOUNTER — Encounter (HOSPITAL_COMMUNITY): Payer: Self-pay | Admitting: Internal Medicine

## 2021-12-20 ENCOUNTER — Inpatient Hospital Stay (HOSPITAL_COMMUNITY): Payer: BC Managed Care – PPO

## 2021-12-20 DIAGNOSIS — E876 Hypokalemia: Secondary | ICD-10-CM

## 2021-12-20 DIAGNOSIS — I2542 Coronary artery dissection: Secondary | ICD-10-CM

## 2021-12-20 DIAGNOSIS — I2510 Atherosclerotic heart disease of native coronary artery without angina pectoris: Secondary | ICD-10-CM

## 2021-12-20 DIAGNOSIS — I214 Non-ST elevation (NSTEMI) myocardial infarction: Secondary | ICD-10-CM | POA: Diagnosis not present

## 2021-12-20 HISTORY — PX: LEFT HEART CATH AND CORONARY ANGIOGRAPHY: CATH118249

## 2021-12-20 LAB — BASIC METABOLIC PANEL
Anion gap: 9 (ref 5–15)
BUN: 12 mg/dL (ref 6–20)
CO2: 25 mmol/L (ref 22–32)
Calcium: 9.1 mg/dL (ref 8.9–10.3)
Chloride: 104 mmol/L (ref 98–111)
Creatinine, Ser: 0.79 mg/dL (ref 0.44–1.00)
GFR, Estimated: >60 mL/min (ref >60)
Glucose, Bld: 99 mg/dL (ref 70–99)
Potassium: 3.4 mmol/L — ABNORMAL LOW (ref 3.5–5.1)
Sodium: 138 mmol/L (ref 135–145)

## 2021-12-20 LAB — LIPID PANEL
Cholesterol: 175 mg/dL (ref 0–200)
HDL: 58 mg/dL (ref >40)
LDL Cholesterol: 111 mg/dL — ABNORMAL HIGH (ref 0–99)
Total CHOL/HDL Ratio: 3 RATIO
Triglycerides: 32 mg/dL (ref <150)
VLDL: 6 mg/dL (ref 0–40)

## 2021-12-20 LAB — CBC
HCT: 40.2 % (ref 36.0–46.0)
Hemoglobin: 13.5 g/dL (ref 12.0–15.0)
MCH: 31.5 pg (ref 26.0–34.0)
MCHC: 33.6 g/dL (ref 30.0–36.0)
MCV: 93.7 fL (ref 80.0–100.0)
Platelets: 252 10*3/uL (ref 150–400)
RBC: 4.29 MIL/uL (ref 3.87–5.11)
RDW: 13.2 % (ref 11.5–15.5)
WBC: 7.6 10*3/uL (ref 4.0–10.5)
nRBC: 0 % (ref 0.0–0.2)

## 2021-12-20 LAB — PROTIME-INR
INR: 1.1 (ref 0.8–1.2)
Prothrombin Time: 14.5 seconds (ref 11.4–15.2)

## 2021-12-20 LAB — ECHOCARDIOGRAM COMPLETE
Area-P 1/2: 3.89 cm2
Height: 67 in
S' Lateral: 3.2 cm
Weight: 2846.58 oz

## 2021-12-20 LAB — HEPARIN LEVEL (UNFRACTIONATED): Heparin Unfractionated: 0.6 IU/mL (ref 0.30–0.70)

## 2021-12-20 LAB — HIV ANTIBODY (ROUTINE TESTING W REFLEX): HIV Screen 4th Generation wRfx: NONREACTIVE

## 2021-12-20 LAB — T4, FREE: Free T4: 0.78 ng/dL (ref 0.61–1.12)

## 2021-12-20 LAB — TSH: TSH: 0.962 u[IU]/mL (ref 0.350–4.500)

## 2021-12-20 SURGERY — LEFT HEART CATH AND CORONARY ANGIOGRAPHY
Anesthesia: LOCAL

## 2021-12-20 MED ORDER — SODIUM CHLORIDE 0.9 % WEIGHT BASED INFUSION: 1.0000 mL/kg/h | INTRAVENOUS | Status: DC

## 2021-12-20 MED ORDER — SODIUM CHLORIDE 0.9% FLUSH
3.0000 mL | Freq: Two times a day (BID) | INTRAVENOUS | Status: DC
  Administered 2021-12-20 – 2021-12-21 (×3): 3 mL via INTRAVENOUS

## 2021-12-20 MED ORDER — HEPARIN (PORCINE) IN NACL 1000-0.9 UT/500ML-% IV SOLN
INTRAVENOUS | Status: DC | PRN
  Administered 2021-12-20 (×2): 500 mL

## 2021-12-20 MED ORDER — VERAPAMIL HCL 2.5 MG/ML IV SOLN
INTRAVENOUS | Status: AC
  Filled 2021-12-20: qty 2

## 2021-12-20 MED ORDER — SENNOSIDES-DOCUSATE SODIUM 8.6-50 MG PO TABS
1.0000 | ORAL_TABLET | ORAL | Status: DC | PRN
  Administered 2021-12-21: 1 via ORAL
  Filled 2021-12-20: qty 1

## 2021-12-20 MED ORDER — SODIUM CHLORIDE 0.9 % WEIGHT BASED INFUSION
3.0000 mL/kg/h | INTRAVENOUS | Status: DC
  Administered 2021-12-20: 3 mL/kg/h via INTRAVENOUS

## 2021-12-20 MED ORDER — HEPARIN SODIUM (PORCINE) 1000 UNIT/ML IJ SOLN
INTRAMUSCULAR | Status: AC
  Filled 2021-12-20: qty 10

## 2021-12-20 MED ORDER — SODIUM CHLORIDE 0.9 % IV SOLN: INTRAVENOUS | Status: AC

## 2021-12-20 MED ORDER — LIDOCAINE HCL (PF) 1 % IJ SOLN
INTRAMUSCULAR | Status: DC | PRN
  Administered 2021-12-20: 15 mL

## 2021-12-20 MED ORDER — LABETALOL HCL 5 MG/ML IV SOLN: 10.0000 mg | INTRAVENOUS | Status: AC | PRN

## 2021-12-20 MED ORDER — ASPIRIN 81 MG PO CHEW
81.0000 mg | CHEWABLE_TABLET | ORAL | Status: AC
  Administered 2021-12-20: 81 mg via ORAL
  Filled 2021-12-20: qty 1

## 2021-12-20 MED ORDER — MIDAZOLAM HCL 2 MG/2ML IJ SOLN
INTRAMUSCULAR | Status: DC | PRN
  Administered 2021-12-20: 1 mg via INTRAVENOUS

## 2021-12-20 MED ORDER — FENTANYL CITRATE (PF) 100 MCG/2ML IJ SOLN
INTRAMUSCULAR | Status: DC | PRN
  Administered 2021-12-20: 25 ug via INTRAVENOUS

## 2021-12-20 MED ORDER — ASPIRIN 81 MG PO CHEW
324.0000 mg | CHEWABLE_TABLET | Freq: Once | ORAL | Status: AC
  Administered 2021-12-20: 324 mg via ORAL
  Filled 2021-12-20: qty 4

## 2021-12-20 MED ORDER — PERFLUTREN LIPID MICROSPHERE
1.0000 mL | INTRAVENOUS | Status: AC | PRN
  Administered 2021-12-20: 3 mL via INTRAVENOUS

## 2021-12-20 MED ORDER — IOHEXOL 350 MG/ML SOLN
INTRAVENOUS | Status: DC | PRN
  Administered 2021-12-20: 30 mL

## 2021-12-20 MED ORDER — FENTANYL CITRATE (PF) 100 MCG/2ML IJ SOLN
INTRAMUSCULAR | Status: AC
  Filled 2021-12-20: qty 2

## 2021-12-20 MED ORDER — MIDAZOLAM HCL 2 MG/2ML IJ SOLN
INTRAMUSCULAR | Status: AC
  Filled 2021-12-20: qty 2

## 2021-12-20 MED ORDER — SODIUM CHLORIDE 0.9% FLUSH: 3.0000 mL | INTRAVENOUS | Status: DC | PRN

## 2021-12-20 MED ORDER — POTASSIUM CHLORIDE CRYS ER 20 MEQ PO TBCR
40.0000 meq | EXTENDED_RELEASE_TABLET | Freq: Once | ORAL | Status: AC
  Administered 2021-12-20: 40 meq via ORAL
  Filled 2021-12-20: qty 2

## 2021-12-20 MED ORDER — LIDOCAINE HCL (PF) 1 % IJ SOLN
INTRAMUSCULAR | Status: AC
  Filled 2021-12-20: qty 30

## 2021-12-20 MED ORDER — HEPARIN (PORCINE) IN NACL 1000-0.9 UT/500ML-% IV SOLN
INTRAVENOUS | Status: AC
  Filled 2021-12-20: qty 1000

## 2021-12-20 MED ORDER — SODIUM CHLORIDE 0.9 % IV SOLN: 250.0000 mL | INTRAVENOUS | Status: DC | PRN

## 2021-12-20 MED ORDER — HYDRALAZINE HCL 20 MG/ML IJ SOLN: 10.0000 mg | INTRAMUSCULAR | Status: AC | PRN

## 2021-12-20 SURGICAL SUPPLY — 13 items
CATH INFINITI 5FR MULTPACK ANG (CATHETERS) IMPLANT
DEVICE CLOSURE MYNXGRIP 5F (Vascular Products) IMPLANT
GLIDESHEATH SLEND SS 6F .021 (SHEATH) IMPLANT
GUIDEWIRE INQWIRE 1.5J.035X260 (WIRE) IMPLANT
INQWIRE 1.5J .035X260CM (WIRE) ×1
KIT HEART LEFT (KITS) ×1 IMPLANT
KIT MICROPUNCTURE NIT STIFF (SHEATH) IMPLANT
PACK CARDIAC CATHETERIZATION (CUSTOM PROCEDURE TRAY) ×1 IMPLANT
SHEATH PINNACLE 5F 10CM (SHEATH) IMPLANT
SHEATH PROBE COVER 6X72 (BAG) IMPLANT
TRANSDUCER W/STOPCOCK (MISCELLANEOUS) ×1 IMPLANT
TUBING CIL FLEX 10 FLL-RA (TUBING) ×1 IMPLANT
WIRE EMERALD 3MM-J .035X150CM (WIRE) IMPLANT

## 2021-12-20 NOTE — Telephone Encounter (Signed)
Returned call to husband Suezanne Jacquet (on [place]) to inform him that Dr Burt Knack is out of hospital and office until Friday due to sickness. I've added pt to schedule 12/24/21 as hospital follow-up. No further questions.

## 2021-12-20 NOTE — H&P (View-Only) (Signed)
Rounding Note    Patient Name: Tina Juarez Date of Encounter: 12/20/2021  [place] Cardiologist: Sherren Mocha, MD   Subjective   No chest pain this morning. Sitting up comfortable in bed.   Inpatient Medications    Scheduled Meds:  aspirin EC  81 mg Oral Daily   metoprolol tartrate  12.5 mg Oral BID   Continuous Infusions:  heparin 1,000 Units/hr (12/19/21 2139)   PRN Meds: acetaminophen, nitroGLYCERIN, ondansetron (ZOFRAN) IV   Vital Signs    Vitals:   12/20/21 0630 12/20/21 0634 12/20/21 0637 12/20/21 0652  BP:      Pulse: 65 68 63   Resp: '14 14 17   '$ Temp:    97.7 F (36.5 C)  TempSrc:    Oral  SpO2: 96% 96% 97%   Weight:      Height:       No intake or output data in the 24 hours ending 12/20/21 0747    12/19/2021    5:49 PM 10/22/2021    1:52 PM 09/22/2021    1:36 PM  Last 3 Weights  Weight (lbs) 177 lb 14.6 oz 178 lb 176 lb  Weight (kg) 80.7 kg 80.74 kg 79.833 kg      Telemetry    Sinus Rhythm - Personally Reviewed  ECG    Sinus Rhythm, 84 bpm, q waves in lead llI (unchanged from prior tracing) - Personally Reviewed  Physical Exam   GEN: No acute distress.   Neck: No JVD Cardiac: RRR, no murmurs, rubs, or gallops.  Respiratory: Clear to auscultation bilaterally. GI: Soft, nontender, non-distended  MS: No edema; No deformity. Neuro:  Nonfocal  Psych: Normal affect   Labs    High Sensitivity Troponin:   Recent Labs  Lab 12/19/21 1802 12/19/21 2049  TROPONINIHS 900* 1,740*     Chemistry Recent Labs  Lab 12/19/21 1802 12/20/21 0451  NA 136 138  K 4.0 3.4*  CL 101 104  CO2 24 25  GLUCOSE 135* 99  BUN 16 12  CREATININE 0.86 0.79  CALCIUM 9.2 9.1  GFRNONAA >60 >60  ANIONGAP 11 9    Lipids  Recent Labs  Lab 12/20/21 0451  CHOL 175  TRIG 32  HDL 58  LDLCALC 111*  CHOLHDL 3.0    Hematology Recent Labs  Lab 12/19/21 1802 12/20/21 0451  WBC 10.2 7.6  RBC 4.45 4.29  HGB 14.1 13.5  HCT  41.7 40.2  MCV 93.7 93.7  MCH 31.7 31.5  MCHC 33.8 33.6  RDW 13.0 13.2  PLT 282 252   Thyroid  Recent Labs  Lab 12/20/21 0451  TSH 0.962  FREET4 0.78    BNPNo results for input(s): "BNP", "PROBNP" in the last 168 hours.  DDimer No results for input(s): "DDIMER" in the last 168 hours.   Radiology    DG Chest 2 View  Result Date: 12/19/2021 CLINICAL DATA:  Chest pain EXAM: CHEST - 2 VIEW COMPARISON:  None Available. FINDINGS: The heart size and mediastinal contours are within normal limits. No focal pulmonary opacity. No pleural effusion or pneumothorax. The visualized upper abdomen is unremarkable. No acute osseous abnormality. IMPRESSION: No acute cardiopulmonary abnormality. Electronically Signed   By: Beryle Flock M.D.   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   Assessment & Plan    NSTEMI  Hx of SCAD -- presented with chest pain, several episodes yesterday. hsTn 900>>1740. Started on IV heparin on admission, along with ASA. No chest pain this morning.  -- plan for cardiac cath  -- ASA, metoprolol -- echo pending   Hypokalemia -- K+ 3.4 -- supplement  Shared Decision Making/Informed Consent{ The risks [stroke (1 in 1000), death (1 in 1000), kidney failure [usually temporary] (1 in 500), bleeding (1 in 200), allergic reaction [possibly serious] (1 in 200)], benefits (diagnostic support and management of coronary artery disease) and alternatives of a cardiac catheterization were discussed in detail with Tina Juarez and she is willing to proceed.  For questions or updates, please contact [place] Please consult [email or web address] for contact info under        Signed, Reino Bellis, NP  12/20/2021, 7:47 AM    I have examined the patient and reviewed assessment and plan and discussed with patient.  Agree with above as stated.    Patient is very  well-informed about scad.  She wanted to be sure that our interventional team was aware that oftentimes, no intervention is the best treatment when SCAD is noted on the catheterization.  Currently, she is pain-free.  All questions about catheterization and been answered.  Expect that cardiac cath will occur shortly.  She would also like her husband to be called with results.  Tina Juarez

## 2021-12-20 NOTE — ED Notes (Signed)
Pt ambulated to restroom. 

## 2021-12-20 NOTE — Telephone Encounter (Signed)
Patient's spouse is calling to let Dr. Burt Knack know that patient had heart attack yesterday and is now in the cath lab. Wants Dr. Burt Knack to look over files and make sure everything is ok with the patient. Would like a call back [phone]

## 2021-12-20 NOTE — Progress Notes (Signed)
2D Echo attempted, patient is still in cath lab at 11:24. Will re-attempt once patient has a bed.  Tina Juarez

## 2021-12-20 NOTE — Progress Notes (Signed)
SITE AREA: right groin/femoral  SITE : hematoma noted upon arrival to cath lab holding, [place] 4:  LEVEL 1   PRESSURE APPLIED FOR: approximately 10 minutes  MANUAL: yes  PATIENT STATUS DURING PULL: stable  POST PULL SITE:  LEVEL 0  POST PULL INSTRUCTIONS GIVEN: yes  POST PULL PULSES PRESENT: bilateral pedal pulses at +2  DRESSING APPLIED: gauze with tegaderm remain in place, mynx noted  BEDREST BEGINS @ 1021  COMMENTS:

## 2021-12-20 NOTE — Progress Notes (Signed)
Mobility Specialist - Progress Note   12/20/21 1549  Mobility  Activity Ambulated independently to bathroom  Level of Assistance Independent  Assistive Device None  Distance Ambulated (ft) 10 ft  Activity Response Tolerated well  Mobility Referral No  $Mobility charge 1 Mobility   Pt received in bed requesting assistance to BR. No complaints throughout. Pt instructed to use call bell when finished.  Franki Monte  Mobility Specialist Please contact via Solicitor or Rehab office at [phone]

## 2021-12-20 NOTE — Care Management (Signed)
  Transition of Care [facility]) Screening Note   Patient Details  Name: Tina Juarez Date of Birth: 09-14-1965   Transition of Care [facility]) CM/SW Contact:    Bethena Roys, RN Phone Number: 12/20/2021, 1:00 PM    Transition of Care Department [facility]) has reviewed the patient and no TOC needs have been identified at this time. We will continue to monitor patient advancement through interdisciplinary progression rounds. If new patient transition needs arise, please place a TOC consult.

## 2021-12-20 NOTE — Interval H&P Note (Signed)
History and Physical Interval Note:  12/20/2021 9:16 AM  Tina Juarez  has presented today for surgery, with the diagnosis of NSTEMI.  The various methods of treatment have been discussed with the patient and family. After consideration of risks, benefits and other options for treatment, the patient has consented to  Procedure(s): LEFT HEART CATH AND CORONARY ANGIOGRAPHY (N/A) as a surgical intervention.  The patient's history has been reviewed, patient examined, no change in status, stable for surgery.  I have reviewed the patient's chart and labs.  Questions were answered to the patient's satisfaction.    Cath Lab Visit (complete for each Cath Lab visit)  Clinical Evaluation Leading to the Procedure:   ACS: Yes.    Non-ACS:  N/A  [place]

## 2021-12-20 NOTE — Progress Notes (Addendum)
Rounding Note    Patient Name: Tina Juarez Date of Encounter: 12/20/2021  [place] Cardiologist: Sherren Mocha, MD   Subjective   No chest pain this morning. Sitting up comfortable in bed.   Inpatient Medications    Scheduled Meds:  aspirin EC  81 mg Oral Daily   metoprolol tartrate  12.5 mg Oral BID   Continuous Infusions:  heparin 1,000 Units/hr (12/19/21 2139)   PRN Meds: acetaminophen, nitroGLYCERIN, ondansetron (ZOFRAN) IV   Vital Signs    Vitals:   12/20/21 0630 12/20/21 0634 12/20/21 0637 12/20/21 0652  BP:      Pulse: 65 68 63   Resp: '14 14 17   '$ Temp:    97.7 F (36.5 C)  TempSrc:    Oral  SpO2: 96% 96% 97%   Weight:      Height:       No intake or output data in the 24 hours ending 12/20/21 0747    12/19/2021    5:49 PM 10/22/2021    1:52 PM 09/22/2021    1:36 PM  Last 3 Weights  Weight (lbs) 177 lb 14.6 oz 178 lb 176 lb  Weight (kg) 80.7 kg 80.74 kg 79.833 kg      Telemetry    Sinus Rhythm - Personally Reviewed  ECG    Sinus Rhythm, 84 bpm, q waves in lead llI (unchanged from prior tracing) - Personally Reviewed  Physical Exam   GEN: No acute distress.   Neck: No JVD Cardiac: RRR, no murmurs, rubs, or gallops.  Respiratory: Clear to auscultation bilaterally. GI: Soft, nontender, non-distended  MS: No edema; No deformity. Neuro:  Nonfocal  Psych: Normal affect   Labs    High Sensitivity Troponin:   Recent Labs  Lab 12/19/21 1802 12/19/21 2049  TROPONINIHS 900* 1,740*     Chemistry Recent Labs  Lab 12/19/21 1802 12/20/21 0451  NA 136 138  K 4.0 3.4*  CL 101 104  CO2 24 25  GLUCOSE 135* 99  BUN 16 12  CREATININE 0.86 0.79  CALCIUM 9.2 9.1  GFRNONAA >60 >60  ANIONGAP 11 9    Lipids  Recent Labs  Lab 12/20/21 0451  CHOL 175  TRIG 32  HDL 58  LDLCALC 111*  CHOLHDL 3.0    Hematology Recent Labs  Lab 12/19/21 1802 12/20/21 0451  WBC 10.2 7.6  RBC 4.45 4.29  HGB 14.1 13.5  HCT  41.7 40.2  MCV 93.7 93.7  MCH 31.7 31.5  MCHC 33.8 33.6  RDW 13.0 13.2  PLT 282 252   Thyroid  Recent Labs  Lab 12/20/21 0451  TSH 0.962  FREET4 0.78    BNPNo results for input(s): "BNP", "PROBNP" in the last 168 hours.  DDimer No results for input(s): "DDIMER" in the last 168 hours.   Radiology    DG Chest 2 View  Result Date: 12/19/2021 CLINICAL DATA:  Chest pain EXAM: CHEST - 2 VIEW COMPARISON:  None Available. FINDINGS: The heart size and mediastinal contours are within normal limits. No focal pulmonary opacity. No pleural effusion or pneumothorax. The visualized upper abdomen is unremarkable. No acute osseous abnormality. IMPRESSION: No acute cardiopulmonary abnormality. Electronically Signed   By: Beryle Flock M.D.   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   Assessment & Plan    NSTEMI  Hx of SCAD -- presented with chest pain, several episodes yesterday. hsTn 900>>1740. Started on IV heparin on admission, along with ASA. No chest pain this morning.  -- plan for cardiac cath  -- ASA, metoprolol -- echo pending   Hypokalemia -- K+ 3.4 -- supplement  Shared Decision Making/Informed Consent{ The risks [stroke (1 in 1000), death (1 in 1000), kidney failure [usually temporary] (1 in 500), bleeding (1 in 200), allergic reaction [possibly serious] (1 in 200)], benefits (diagnostic support and management of coronary artery disease) and alternatives of a cardiac catheterization were discussed in detail with Ms. Greif and she is willing to proceed.  For questions or updates, please contact [place] Please consult [email or web address] for contact info under        Signed, Reino Bellis, NP  12/20/2021, 7:47 AM    I have examined the patient and reviewed assessment and plan and discussed with patient.  Agree with above as stated.    Patient is very  well-informed about scad.  She wanted to be sure that our interventional team was aware that oftentimes, no intervention is the best treatment when SCAD is noted on the catheterization.  Currently, she is pain-free.  All questions about catheterization and been answered.  Expect that cardiac cath will occur shortly.  She would also like her husband to be called with results.  Larae Grooms

## 2021-12-20 NOTE — Progress Notes (Signed)
ANTICOAGULATION CONSULT NOTE  Pharmacy Consult for heparin Indication: chest pain/ACS  No Known Allergies  Patient Measurements: Height: '5\' 7"'$  (170.2 cm) Weight: 80.7 kg (177 lb 14.6 oz) IBW/kg (Calculated) : 61.6 Heparin Dosing Weight: 80kg  Vital Signs: Temp: 97.6 F (36.4 C) (12/18 0341) Temp Source: Oral (12/18 0341) BP: 100/55 (12/18 0500) Pulse Rate: 64 (12/18 0500)  Labs: Recent Labs    12/19/21 1802 12/19/21 2049 12/20/21 0451  HGB 14.1  --  13.5  HCT 41.7  --  40.2  PLT 282  --  252  HEPARINUNFRC  --   --  0.60  CREATININE 0.86  --   --   TROPONINIHS 900* 1,740*  --      Estimated Creatinine Clearance: 78.8 mL/min (by C-G formula based on SCr of 0.86 mg/dL).   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 Hx of SCAD. CBC wnl on admit, no AC PTA. Pharmacy to dose heparin.  12/18 AM update:  Heparin level therapeutic   Goal of Therapy:  Heparin level 0.3-0.7 units/ml Monitor platelets by anticoagulation protocol: Yes   Plan:  Cont heparin 1000 units/hr 1200 heparin level  Narda Bonds, PharmD, BCPS Clinical Pharmacist Phone: [phone]

## 2021-12-21 ENCOUNTER — Other Ambulatory Visit (HOSPITAL_COMMUNITY): Payer: Self-pay

## 2021-12-21 DIAGNOSIS — E782 Mixed hyperlipidemia: Secondary | ICD-10-CM

## 2021-12-21 DIAGNOSIS — R7989 Other specified abnormal findings of blood chemistry: Secondary | ICD-10-CM | POA: Diagnosis not present

## 2021-12-21 DIAGNOSIS — R931 Abnormal findings on diagnostic imaging of heart and coronary circulation: Secondary | ICD-10-CM

## 2021-12-21 DIAGNOSIS — I2542 Coronary artery dissection: Secondary | ICD-10-CM | POA: Diagnosis not present

## 2021-12-21 DIAGNOSIS — E785 Hyperlipidemia, unspecified: Secondary | ICD-10-CM | POA: Insufficient documentation

## 2021-12-21 LAB — HEMOGLOBIN A1C
Hgb A1c MFr Bld: 5.7 % — ABNORMAL HIGH (ref 4.8–5.6)
Mean Plasma Glucose: 117 mg/dL

## 2021-12-21 LAB — BASIC METABOLIC PANEL
Anion gap: 6 (ref 5–15)
BUN: 12 mg/dL (ref 6–20)
CO2: 24 mmol/L (ref 22–32)
Calcium: 8.7 mg/dL — ABNORMAL LOW (ref 8.9–10.3)
Chloride: 106 mmol/L (ref 98–111)
Creatinine, Ser: 0.77 mg/dL (ref 0.44–1.00)
GFR, Estimated: >60 mL/min (ref >60)
Glucose, Bld: 95 mg/dL (ref 70–99)
Potassium: 3.9 mmol/L (ref 3.5–5.1)
Sodium: 136 mmol/L (ref 135–145)

## 2021-12-21 LAB — CBC
HCT: 38 % (ref 36.0–46.0)
Hemoglobin: 12.8 g/dL (ref 12.0–15.0)
MCH: 31.8 pg (ref 26.0–34.0)
MCHC: 33.7 g/dL (ref 30.0–36.0)
MCV: 94.3 fL (ref 80.0–100.0)
Platelets: 222 10*3/uL (ref 150–400)
RBC: 4.03 MIL/uL (ref 3.87–5.11)
RDW: 13.1 % (ref 11.5–15.5)
WBC: 7.2 10*3/uL (ref 4.0–10.5)
nRBC: 0 % (ref 0.0–0.2)

## 2021-12-21 MED ORDER — ROSUVASTATIN CALCIUM 20 MG PO TABS
20.0000 mg | ORAL_TABLET | Freq: Every day | ORAL | Status: DC
  Administered 2021-12-21: 20 mg via ORAL
  Filled 2021-12-21: qty 1

## 2021-12-21 MED ORDER — NITROGLYCERIN 0.4 MG SL SUBL
0.4000 mg | SUBLINGUAL_TABLET | SUBLINGUAL | 1 refills | Status: AC | PRN
  Filled 2021-12-21: qty 25, 5d supply, fill #0

## 2021-12-21 MED ORDER — CARVEDILOL 3.125 MG PO TABS
3.1250 mg | ORAL_TABLET | Freq: Two times a day (BID) | ORAL | 0 refills | Status: DC
  Filled 2021-12-21: qty 180, 90d supply, fill #0

## 2021-12-21 MED ORDER — CARVEDILOL 6.25 MG PO TABS
6.2500 mg | ORAL_TABLET | Freq: Two times a day (BID) | ORAL | 1 refills | Status: DC
  Filled 2021-12-21: qty 180, 90d supply, fill #0

## 2021-12-21 MED ORDER — METOPROLOL TARTRATE 25 MG PO TABS
25.0000 mg | ORAL_TABLET | Freq: Two times a day (BID) | ORAL | Status: DC
  Administered 2021-12-21: 25 mg via ORAL
  Filled 2021-12-21: qty 1

## 2021-12-21 MED ORDER — CARVEDILOL 6.25 MG PO TABS: 6.2500 mg | ORAL_TABLET | Freq: Two times a day (BID) | ORAL | Status: DC

## 2021-12-21 MED ORDER — ROSUVASTATIN CALCIUM 20 MG PO TABS
20.0000 mg | ORAL_TABLET | Freq: Every day | ORAL | 1 refills | Status: DC
  Filled 2021-12-21: qty 90, 90d supply, fill #0

## 2021-12-21 MED FILL — Verapamil HCl IV Soln 2.5 MG/ML: INTRAVENOUS | Qty: 2 | Status: AC

## 2021-12-21 MED FILL — Heparin Sodium (Porcine) Inj 1000 Unit/ML: INTRAMUSCULAR | Qty: 10 | Status: AC

## 2021-12-21 NOTE — Progress Notes (Signed)
Mobility Specialist - Progress Note   12/21/21 1203  Mobility  Activity Ambulated independently in hallway  Level of Assistance Independent  Assistive Device None  Distance Ambulated (ft) 340 ft  Activity Response Tolerated well  Mobility Referral No  $Mobility charge 1 Mobility   Pt received in hallway and agreeable to session. No complaints throughout. Pt was returned to room with all needs met.  Franki Monte  Mobility Specialist Please contact via Solicitor or Rehab office at [phone]

## 2021-12-21 NOTE — Progress Notes (Addendum)
Rounding Note    Patient Name: Tina Juarez Date of Encounter: 12/21/2021  [place] Cardiologist: Sherren Mocha, MD   Subjective   Feeling well this morning.   Inpatient Medications    Scheduled Meds:  aspirin EC  81 mg Oral Daily   metoprolol tartrate  12.5 mg Oral BID   sodium chloride flush  3 mL Intravenous Q12H   Continuous Infusions:  sodium chloride     PRN Meds: sodium chloride, acetaminophen, nitroGLYCERIN, ondansetron (ZOFRAN) IV, senna-docusate, sodium chloride flush   Vital Signs    Vitals:   12/20/21 1630 12/20/21 2146 12/21/21 0021 12/21/21 0344  BP: 118/75 109/71 108/66 109/66  Pulse: 68 67 66 63  Resp: '18 16 15   '$ Temp: 98.5 F (36.9 C) 98.2 F (36.8 C) 97.7 F (36.5 C) 98.5 F (36.9 C)  TempSrc: Oral Oral Oral Oral  SpO2: 97% 98%  99%  Weight:      Height:       No intake or output data in the 24 hours ending 12/21/21 0915    12/19/2021    5:49 PM 10/22/2021    1:52 PM 09/22/2021    1:36 PM  Last 3 Weights  Weight (lbs) 177 lb 14.6 oz 178 lb 176 lb  Weight (kg) 80.7 kg 80.74 kg 79.833 kg      Telemetry    Sinus Rhythm, ST with ambulation - Personally Reviewed  ECG    No new tracing  Physical Exam   GEN: No acute distress.   Neck: No JVD Cardiac: RRR, no murmurs, rubs, or gallops.  Respiratory: Clear to auscultation bilaterally. GI: Soft, nontender, non-distended  MS: No edema; No deformity. Right femoral cath site stable  Neuro:  Nonfocal  Psych: Normal affect   Labs    High Sensitivity Troponin:   Recent Labs  Lab 12/19/21 1802 12/19/21 2049  TROPONINIHS 900* 1,740*     Chemistry Recent Labs  Lab 12/19/21 1802 12/20/21 0451 12/21/21 0206  NA 136 138 136  K 4.0 3.4* 3.9  CL 101 104 106  CO2 '24 25 24  '$ GLUCOSE 135* 99 95  BUN '16 12 12  '$ CREATININE 0.86 0.79 0.77  CALCIUM 9.2 9.1 8.7*  GFRNONAA >60 >60 >60  ANIONGAP '11 9 6    '$ Lipids  Recent Labs  Lab 12/20/21 0451  CHOL 175   TRIG 32  HDL 58  LDLCALC 111*  CHOLHDL 3.0    Hematology Recent Labs  Lab 12/19/21 1802 12/20/21 0451 12/21/21 0206  WBC 10.2 7.6 7.2  RBC 4.45 4.29 4.03  HGB 14.1 13.5 12.8  HCT 41.7 40.2 38.0  MCV 93.7 93.7 94.3  MCH 31.7 31.5 31.8  MCHC 33.8 33.6 33.7  RDW 13.0 13.2 13.1  PLT 282 252 222   Thyroid  Recent Labs  Lab 12/20/21 0451  TSH 0.962  FREET4 0.78    BNPNo results for input(s): "BNP", "PROBNP" in the last 168 hours.  DDimer No results for input(s): "DDIMER" in the last 168 hours.   Radiology    ECHOCARDIOGRAM COMPLETE  Result Date: 12/20/2021    ECHOCARDIOGRAM REPORT   Patient Name:   Tina Juarez [facility] Date of Exam: 12/20/2021 Medical Rec #:  [id number]               Height:       67.0 in Accession #:    [id number]              Weight:  177.9 lb Date of Birth:  16-Nov-1965               BSA:          1.924 m Patient Age:    56 years                BP:           117/72 mmHg Patient Gender: F                       HR:           67 bpm. Exam Location:  Inpatient Procedure: 2D Echo, Color Doppler, Cardiac Doppler and Intracardiac            Opacification Agent Indications:    SCAD  History:        Patient has prior history of Echocardiogram examinations, most                 recent 05/14/2020.  Sonographer:    Raquel Sarna Senior RDCS Referring Phys: [id number] [place]  1. Left ventricular ejection fraction, by estimation, is 50 to 55%. The left ventricle has low normal function. The left ventricle demonstrates regional wall motion abnormalities (see scoring diagram/findings for description). Left ventricular diastolic  parameters are consistent with Grade I diastolic dysfunction (impaired relaxation).  2. Right ventricular systolic function is normal. The right ventricular size is normal. Tricuspid regurgitation signal is inadequate for assessing PA pressure.  3. The mitral valve is grossly normal. Trivial mitral valve regurgitation. No evidence of  mitral stenosis.  4. The aortic valve was not well visualized. Aortic valve regurgitation is not visualized. No aortic stenosis is present.  5. The inferior vena cava is normal in size with greater than 50% respiratory variability, suggesting right atrial pressure of 3 mmHg. Conclusion(s)/Recommendation(s): No left ventricular mural or apical thrombus/thrombi. FINDINGS  Left Ventricle: Left ventricular ejection fraction, by estimation, is 50 to 55%. The left ventricle has low normal function. The left ventricle demonstrates regional wall motion abnormalities. Definity contrast agent was given IV to delineate the left ventricular endocardial borders. The left ventricular internal cavity size was normal in size. There is no left ventricular hypertrophy. Left ventricular diastolic parameters are consistent with Grade I diastolic dysfunction (impaired relaxation).  LV Wall Scoring: The apical septal segment, apical inferior segment, and apex are hypokinetic. Right Ventricle: The right ventricular size is normal. No increase in right ventricular wall thickness. Right ventricular systolic function is normal. Tricuspid regurgitation signal is inadequate for assessing PA pressure. Left Atrium: Left atrial size was normal in size. Right Atrium: Right atrial size was normal in size. Pericardium: There is no evidence of pericardial effusion. Mitral Valve: The mitral valve is grossly normal. Trivial mitral valve regurgitation. No evidence of mitral valve stenosis. Tricuspid Valve: The tricuspid valve is grossly normal. Tricuspid valve regurgitation is trivial. No evidence of tricuspid stenosis. Aortic Valve: The aortic valve was not well visualized. Aortic valve regurgitation is not visualized. No aortic stenosis is present. Pulmonic Valve: The pulmonic valve was grossly normal. Pulmonic valve regurgitation is not visualized. No evidence of pulmonic stenosis. Aorta: The aortic root and ascending aorta are structurally normal,  with no evidence of dilitation. Venous: The inferior vena cava is normal in size with greater than 50% respiratory variability, suggesting right atrial pressure of 3 mmHg. IAS/Shunts: The atrial septum is grossly normal.  LEFT VENTRICLE PLAX 2D LVIDd:  4.30 cm   Diastology LVIDs:         3.20 cm   LV e' medial:    6.09 cm/s LV PW:         1.00 cm   LV E/e' medial:  11.3 LV IVS:        0.90 cm   LV e' lateral:   8.38 cm/s LVOT diam:     1.90 cm   LV E/e' lateral: 8.2 LV SV:         58 LV SV Index:   30 LVOT Area:     2.84 cm  RIGHT VENTRICLE RV S prime:     11.00 cm/s TAPSE (M-mode): 1.8 cm LEFT ATRIUM             Index        RIGHT ATRIUM           Index LA diam:        2.20 cm 1.14 cm/m   RA Area:     11.20 cm LA Vol (A2C):   54.8 ml 28.48 ml/m  RA Volume:   23.70 ml  12.32 ml/m LA Vol (A4C):   41.3 ml 21.46 ml/m LA Biplane Vol: 48.6 ml 25.26 ml/m  AORTIC VALVE LVOT Vmax:   90.70 cm/s LVOT Vmean:  69.400 cm/s LVOT VTI:    0.203 m  AORTA Ao Root diam: 3.30 cm MITRAL VALVE MV Area (PHT): 3.89 cm    SHUNTS MV Decel Time: 195 msec    Systemic VTI:  0.20 m MV E velocity: 68.60 cm/s  Systemic Diam: 1.90 cm MV A velocity: 88.30 cm/s MV E/A ratio:  0.78 Eleonore Chiquito MD Electronically signed by Eleonore Chiquito MD Signature Date/Time: 12/20/2021/4:12:33 PM    Final    CARDIAC CATHETERIZATION  Result Date: 12/20/2021 Conclusions: Irregular tapering of apical LAD and distal segment of 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  The large/proximal coronary arteries are without significant disease. Low normal left ventricular systolic function with apical wall motion abnormality (LVEF 50-55%). Normal left ventricular filling pressure. 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  Continue indefinite aspirin 81 mg daily; defer P2Y12 inhibitor and IV heparin in the setting of SCAD. Continue metoprolol, with  dose escalation as heart rate and blood pressure allow. Nelva Bush, MD [facility]  DG Chest 2 View  Result Date: 12/19/2021 CLINICAL DATA:  Chest pain EXAM: CHEST - 2 VIEW COMPARISON:  None Available. FINDINGS: The heart size and mediastinal contours are within normal limits. No focal pulmonary opacity. No pleural effusion or pneumothorax. The visualized upper abdomen is unremarkable. No acute osseous abnormality. IMPRESSION: No acute cardiopulmonary abnormality. Electronically Signed   By: Beryle Flock M.D.   On: 12/19/2021 19:38    Cardiac Studies   Cath: 12/20/2021  Conclusions: Irregular tapering of apical LAD and distal segment of 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  The large/proximal coronary arteries are without significant disease. Low normal left ventricular systolic function with apical wall motion abnormality (LVEF 50-55%). Normal left ventricular filling pressure.   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  Continue indefinite aspirin 81 mg daily; defer P2Y12 inhibitor and IV heparin in the setting of SCAD. Continue metoprolol, with dose escalation as heart rate and blood pressure allow.   Nelva Bush, MD [facility]  Diagnostic Dominance: Right  Echo:  12/20/2021  IMPRESSIONS     1. Left ventricular ejection fraction, by estimation, is 50 to 55%. The  left ventricle has low normal function. The left ventricle demonstrates  regional wall motion abnormalities (see scoring diagram/findings for  description). Left ventricular diastolic   parameters are consistent with Grade I diastolic dysfunction (impaired  relaxation).   2. Right ventricular systolic function is normal. The right ventricular  size is normal. Tricuspid regurgitation signal is inadequate for assessing  PA pressure.   3. The mitral valve is grossly normal. Trivial  mitral valve  regurgitation. No evidence of mitral stenosis.   4. The aortic valve was not well visualized. Aortic valve regurgitation  is not visualized. No aortic stenosis is present.   5. The inferior vena cava is normal in size with greater than 50%  respiratory variability, suggesting right atrial pressure of 3 mmHg.   Conclusion(s)/Recommendation(s): No left ventricular mural or apical  thrombus/thrombi.   FINDINGS   Left Ventricle: Left ventricular ejection fraction, by estimation, is 50  to 55%. The left ventricle has low normal function. The left ventricle  demonstrates regional wall motion abnormalities. Definity contrast agent  was given IV to delineate the left  ventricular endocardial borders. The left ventricular internal cavity size  was normal in size. There is no left ventricular hypertrophy. Left  ventricular diastolic parameters are consistent with Grade I diastolic  dysfunction (impaired relaxation).     LV Wall Scoring:  The apical septal segment, apical inferior segment, and apex are  hypokinetic.   Right Ventricle: The right ventricular size is normal. No increase in  right ventricular wall thickness. Right ventricular systolic function is  normal. Tricuspid regurgitation signal is inadequate for assessing PA  pressure.   Left Atrium: Left atrial size was normal in size.   Right Atrium: Right atrial size was normal in size.   Pericardium: There is no evidence of pericardial effusion.   Mitral Valve: The mitral valve is grossly normal. Trivial mitral valve  regurgitation. No evidence of mitral valve stenosis.   Tricuspid Valve: The tricuspid valve is grossly normal. Tricuspid valve  regurgitation is trivial. No evidence of tricuspid stenosis.   Aortic Valve: The aortic valve was not well visualized. Aortic valve  regurgitation is not visualized. No aortic stenosis is present.   Pulmonic Valve: The pulmonic valve was grossly normal. Pulmonic valve   regurgitation is not visualized. No evidence of pulmonic stenosis.   Aorta: The aortic root and ascending aorta are structurally normal, with  no evidence of dilitation.   Venous: The inferior vena cava is normal in size with greater than 50%  respiratory variability, suggesting right atrial pressure of 3 mmHg.   IAS/Shunts: The atrial septum is grossly normal.   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   Assessment & Plan    NSTEMI SCAD recurrence  -- presented with chest pain, several episodes prior to admission. hsTn [id number]. Started on IV heparin on admission, along with ASA.  Underwent cardiac catheterization noted above with recurrent scad in distal LAD and R PL 2 lesions which were too small/distal for intervention.  Recommendations for indefinite aspirin 81 mg daily.  IV heparin was stopped and P2 Y12 inhibitor deferred per Dr. Saunders Revel recommendations.  Echocardiogram showed LVEF of 50 to 55% with apical septal, inferior apical segments being hypokinetic.  Cardiac rehab to see. Of note she has had work up in  the past for FMD which was negative -- continue ASA, metoprolol increase to '25mg'$  BID, add Crestor '20mg'$  daily   HLD -- LDL 111 -- add crestor '20mg'$  daily    Hypokalemia -- resolved  For questions or updates, please contact [place] Please consult [email or web address] for contact info under        Signed, Reino Bellis, NP  12/21/2021, 9:15 AM    I have examined the patient and reviewed assessment and plan and discussed with patient.  Agree with above as stated.    Right femoral site stable.  No hematoma.  She feels well.  She has done well walking.  Apical wall motion abnormality noted on ventriculogram.  She shows no signs of heart failure.  I suspect her LVEF will improve with time.  She had normal flow to the distal branches of her posterolateral artery and her apical LAD, both  areas where perhaps SCAD was suspected based on the catheterization.   Will add statin for general preventive therapy.  She did have fatigue with metoprolol in the past.  Therefore, will discharge on carvedilol 3.125 mg twice a day as opposed to metoprolol which was initially planned.  Continue aspirin.  I also talked her about avoiding straining and very heavy lifting.  Cardio type exercises and light weights are fine.  She feels like she is ready to go home.  Will plan to discharge her later today.  She will follow-up with Dr. Burt Knack.  Larae Grooms

## 2021-12-21 NOTE — Progress Notes (Signed)
CARDIAC REHAB PHASE I   PRE:  Rate/Rhythm: 82 SR  BP:  Sitting: 122/66       MODE:  Ambulation: 470 ft   POST:  Rate/Rhythm: 125 ST  BP:  Sitting: 136/78       Pt ambulated in hall independently with balanced steady pace.Tolerated well with no CP or SOB. Returned to bed with call bell and bedside table in reach. Post MI eduction including site care, heart healthy diet, exercise guidelines, MI booklet, risk factors, restrictions and CRP2 reviewed. All questions and concerns addressed. Will refer to [facility] for CRP2. Will continue to follow.   [id number]  Vanessa Barbara, RN BSN 12/21/2021 9:24 AM

## 2021-12-21 NOTE — Discharge Summary (Addendum)
Discharge Summary    Patient ID: Tina Juarez MRN: [id number]; DOB: [birth year]  Admit date: 12/19/2021 Discharge date: 12/21/2021  PCP:  Laurey Morale, MD   [place] Providers Cardiologist:  Sherren Mocha, MD  Cardiology APP:  Liliane Shi, PA-C  {  Discharge Diagnoses    Principal Problem:   NSTEMI (non-ST elevated myocardial infarction) [facility]) Active Problems:   Spontaneous dissection of coronary artery   Hyperlipidemia  Diagnostic Studies/Procedures    Cath: 12/20/2021  Conclusions: Irregular tapering of apical LAD and distal segment of 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  The large/proximal coronary arteries are without significant disease. Low normal left ventricular systolic function with apical wall motion abnormality (LVEF 50-55%). Normal left ventricular filling pressure.   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  Continue indefinite aspirin 81 mg daily; defer P2Y12 inhibitor and IV heparin in the setting of SCAD. Continue metoprolol, with dose escalation as heart rate and blood pressure allow.   Nelva Bush, MD [facility]  Diagnostic Dominance: Right  Echo: 12/20/2021   IMPRESSIONS     1. Left ventricular ejection fraction, by estimation, is 50 to 55%. The  left ventricle has low normal function. The left ventricle demonstrates  regional wall motion abnormalities (see scoring diagram/findings for  description). Left ventricular diastolic   parameters are consistent with Grade I diastolic dysfunction (impaired  relaxation).   2. Right ventricular systolic function is normal. The right ventricular  size is normal. Tricuspid regurgitation signal is inadequate for assessing  PA pressure.   3. The mitral valve is grossly normal. Trivial mitral valve  regurgitation. No evidence of mitral stenosis.   4.  The aortic valve was not well visualized. Aortic valve regurgitation  is not visualized. No aortic stenosis is present.   5. The inferior vena cava is normal in size with greater than 50%  respiratory variability, suggesting right atrial pressure of 3 mmHg.   Conclusion(s)/Recommendation(s): No left ventricular mural or apical  thrombus/thrombi.   FINDINGS   Left Ventricle: Left ventricular ejection fraction, by estimation, is 50  to 55%. The left ventricle has low normal function. The left ventricle  demonstrates regional wall motion abnormalities. Definity contrast agent  was given IV to delineate the left  ventricular endocardial borders. The left ventricular internal cavity size  was normal in size. There is no left ventricular hypertrophy. Left  ventricular diastolic parameters are consistent with Grade I diastolic  dysfunction (impaired relaxation).     LV Wall Scoring:  The apical septal segment, apical inferior segment, and apex are  hypokinetic.   Right Ventricle: The right ventricular size is normal. No increase in  right ventricular wall thickness. Right ventricular systolic function is  normal. Tricuspid regurgitation signal is inadequate for assessing PA  pressure.   Left Atrium: Left atrial size was normal in size.   Right Atrium: Right atrial size was normal in size.   Pericardium: There is no evidence of pericardial effusion.   Mitral Valve: The mitral valve is grossly normal. Trivial mitral valve  regurgitation. No evidence of mitral valve stenosis.   Tricuspid Valve: The tricuspid valve is grossly normal. Tricuspid valve  regurgitation is trivial. No evidence of tricuspid stenosis.   Aortic Valve: The aortic valve was not well visualized. Aortic valve  regurgitation is not visualized. No aortic stenosis is present.   Pulmonic Valve: The pulmonic valve was grossly normal.  Pulmonic valve  regurgitation is not visualized. No evidence of pulmonic stenosis.    Aorta: The aortic root and ascending aorta are structurally normal, with  no evidence of dilitation.   Venous: The inferior vena cava is normal in size with greater than 50%  respiratory variability, suggesting right atrial pressure of 3 mmHg.   IAS/Shunts: The atrial septum is grossly normal.  _____________   History of Present Illness     Tina Juarez is a 56 y.o. female with a history of spontaneous coronary artery dissection in 2017 while living in [place] for which she had dissection of the right PDA.  She was treated conservatively and repeat imaging a few years later showed normal coronary artery flow.  She has followed with Dr. Burt Knack and saw him last December.  Overall she had been doing well since the event in 2017.  However, unfortunately had acute onset chest pain yesterday that was progressive.  This prompted her to present to the emergency department.   On arrival to the emergency department, she was still having some chest pain.  ECG was with nonspecific changes.  She was given nitroglycerin. Pain subsided.  Initial troponin was 900 and repeat was 1760.   She was started on IV heparin and admitted for further management.   Hospital Course     NSTEMI SCAD recurrence  -- presented with chest pain, several episodes prior to admission. hsTn 900>>1740. Started on IV heparin on admission, along with ASA.  Underwent cardiac catheterization noted above with recurrent scad in distal LAD and R PL 2 lesions which were too small/distal for intervention.  Recommendations for indefinite aspirin 81 mg daily.  IV heparin was stopped and P2 Y12 inhibitor deferred per Dr. Saunders Revel recommendations.  Echocardiogram showed LVEF of 50 to 55% with apical septal, inferior apical segments being hypokinetic.  Cardiac rehab evaluation. Of note she has had work up in the past for FMD which was negative -- continue ASA, added Crestor 60m daily. Initially placed on metoprolol but did not  tolerate in the past. Switched to coreg 3.1226mBID prior to discharge   HLD -- LDL 111 -- added crestor 2092maily  -- will need FLP/LFTs in 8 weeks    Hypokalemia -- resolved  Patient was seen by Dr. VarIrish Lackd deemed stable for discharge home. Follow up in the office arranged. Medications [facility] to [facility] prior to discharge.   Did the patient have an acute coronary syndrome (MI, NSTEMI, STEMI, etc) this admission?:  Yes                               AHA/ACC Clinical Performance & Quality Measures: Aspirin prescribed? - Yes ADP Receptor Inhibitor (Plavix/Clopidogrel, Brilinta/Ticagrelor or Effient/Prasugrel) prescribed (includes medically managed patients)? - No - SCAD, monotherapy with ASA Beta Blocker prescribed? - Yes High Intensity Statin (Lipitor 40-57m22m Crestor 20-40mg54mescribed? - Yes EF assessed during THIS hospitalization? - Yes For EF <40%, was ACEI/ARB prescribed? - Not Applicable (EF >/= 40%) 71% EF <40%, Aldosterone Antagonist (Spironolactone or Eplerenone) prescribed? - Not Applicable (EF >/= 40%) 24%diac Rehab Phase II ordered (including medically managed patients)? - Yes       The patient will be scheduled for a TOC follow up appointment in 10-14 days.  A message has been sent to the [facility] at the office where the patient should be seen for follow up.  _____________  Discharge Vitals Blood pressure 119/75, pulse 78, temperature 99 F (37.2 C), temperature source Oral, resp. rate 17, height _0  (1.702 m), weight 80.7 kg, SpO2 97 %.  Filed Weights   12/19/21 1749  Weight: 80.7 kg    Labs & Radiologic Studies    CBC Recent Labs    12/20/21 0451 12/21/21 0206  WBC 7.6 7.2  HGB 13.5 12.8  HCT 40.2 38.0  MCV 93.7 94.3  PLT 252 081   Basic Metabolic Panel Recent Labs    12/20/21 0451 12/21/21 0206  NA 138 136  K 3.4* 3.9  CL 104 106  CO2 25 24  GLUCOSE 99 95  BUN 12 12  CREATININE 0.79 0.77   CALCIUM 9.1 8.7*   Liver Function Tests No results for input(s): "AST", "ALT", "ALKPHOS", "BILITOT", "PROT", "ALBUMIN" in the last 72 hours. No results for input(s): "LIPASE", "AMYLASE" in the last 72 hours. High Sensitivity Troponin:   Recent Labs  Lab 12/19/21 1802 12/19/21 2049  TROPONINIHS 900* 1,740*    BNP Invalid input(s): "POCBNP" D-Dimer No results for input(s): "DDIMER" in the last 72 hours. Hemoglobin A1C Recent Labs    12/20/21 0451  HGBA1C 5.7*   Fasting Lipid Panel Recent Labs    12/20/21 0451  CHOL 175  HDL 58  LDLCALC 111*  TRIG 32  CHOLHDL 3.0   Thyroid Function Tests Recent Labs    12/20/21 0451  TSH 0.962   _____________  ECHOCARDIOGRAM COMPLETE  Result Date: 12/20/2021    ECHOCARDIOGRAM REPORT   Patient Name:   Tina Juarez [facility] Date of Exam: 12/20/2021 Medical Rec #:  [id number]               Height:       67.0 in Accession #:    [id number]              Weight:       177.9 lb Date of Birth:  1965-07-03               BSA:          1.924 m Patient Age:    56 years                BP:           117/72 mmHg Patient Gender: F                       HR:           67 bpm. Exam Location:  Inpatient Procedure: 2D Echo, Color Doppler, Cardiac Doppler and Intracardiac            Opacification Agent Indications:    SCAD  History:        Patient has prior history of Echocardiogram examinations, most                 recent 05/14/2020.  Sonographer:    Raquel Sarna Senior RDCS Referring Phys: [id number] [place]  1. Left ventricular ejection fraction, by estimation, is 50 to 55%. The left ventricle has low normal function. The left ventricle demonstrates regional wall motion abnormalities (see scoring diagram/findings for description). Left ventricular diastolic  parameters are consistent with Grade I diastolic dysfunction (impaired relaxation).  2. Right ventricular systolic function is normal. The right ventricular size is normal. Tricuspid  regurgitation signal is inadequate for assessing PA pressure.  3. The mitral valve is grossly normal. Trivial mitral valve regurgitation.  No evidence of mitral stenosis.  4. The aortic valve was not well visualized. Aortic valve regurgitation is not visualized. No aortic stenosis is present.  5. The inferior vena cava is normal in size with greater than 50% respiratory variability, suggesting right atrial pressure of 3 mmHg. Conclusion(s)/Recommendation(s): No left ventricular mural or apical thrombus/thrombi. FINDINGS  Left Ventricle: Left ventricular ejection fraction, by estimation, is 50 to 55%. The left ventricle has low normal function. The left ventricle demonstrates regional wall motion abnormalities. Definity contrast agent was given IV to delineate the left ventricular endocardial borders. The left ventricular internal cavity size was normal in size. There is no left ventricular hypertrophy. Left ventricular diastolic parameters are consistent with Grade I diastolic dysfunction (impaired relaxation).  LV Wall Scoring: The apical septal segment, apical inferior segment, and apex are hypokinetic. Right Ventricle: The right ventricular size is normal. No increase in right ventricular wall thickness. Right ventricular systolic function is normal. Tricuspid regurgitation signal is inadequate for assessing PA pressure. Left Atrium: Left atrial size was normal in size. Right Atrium: Right atrial size was normal in size. Pericardium: There is no evidence of pericardial effusion. Mitral Valve: The mitral valve is grossly normal. Trivial mitral valve regurgitation. No evidence of mitral valve stenosis. Tricuspid Valve: The tricuspid valve is grossly normal. Tricuspid valve regurgitation is trivial. No evidence of tricuspid stenosis. Aortic Valve: The aortic valve was not well visualized. Aortic valve regurgitation is not visualized. No aortic stenosis is present. Pulmonic Valve: The pulmonic valve was grossly  normal. Pulmonic valve regurgitation is not visualized. No evidence of pulmonic stenosis. Aorta: The aortic root and ascending aorta are structurally normal, with no evidence of dilitation. Venous: The inferior vena cava is normal in size with greater than 50% respiratory variability, suggesting right atrial pressure of 3 mmHg. IAS/Shunts: The atrial septum is grossly normal.  LEFT VENTRICLE PLAX 2D LVIDd:         4.30 cm   Diastology LVIDs:         3.20 cm   LV e' medial:    6.09 cm/s LV PW:         1.00 cm   LV E/e' medial:  11.3 LV IVS:        0.90 cm   LV e' lateral:   8.38 cm/s LVOT diam:     1.90 cm   LV E/e' lateral: 8.2 LV SV:         58 LV SV Index:   30 LVOT Area:     2.84 cm  RIGHT VENTRICLE RV S prime:     11.00 cm/s TAPSE (M-mode): 1.8 cm LEFT ATRIUM             Index        RIGHT ATRIUM           Index LA diam:        2.20 cm 1.14 cm/m   RA Area:     11.20 cm LA Vol (A2C):   54.8 ml 28.48 ml/m  RA Volume:   23.70 ml  12.32 ml/m LA Vol (A4C):   41.3 ml 21.46 ml/m LA Biplane Vol: 48.6 ml 25.26 ml/m  AORTIC VALVE LVOT Vmax:   90.70 cm/s LVOT Vmean:  69.400 cm/s LVOT VTI:    0.203 m  AORTA Ao Root diam: 3.30 cm MITRAL VALVE MV Area (PHT): 3.89 cm    SHUNTS MV Decel Time: 195 msec    Systemic VTI:  0.20 m MV E velocity:  68.60 cm/s  Systemic Diam: 1.90 cm MV A velocity: 88.30 cm/s MV E/A ratio:  0.78 Eleonore Chiquito MD Electronically signed by Eleonore Chiquito MD Signature Date/Time: 12/20/2021/4:12:33 PM    Final    CARDIAC CATHETERIZATION  Result Date: 12/20/2021 Conclusions: Irregular tapering of apical LAD and distal segment of 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  The large/proximal coronary arteries are without significant disease. Low normal left ventricular systolic function with apical wall motion abnormality (LVEF 50-55%). Normal left ventricular filling pressure. Recommendations: Medical therapy of NSTEMI due to recurrent SCAD; distal LAD and rPL2 lesions are too  small/distal for intervention.  Continue indefinite aspirin 81 mg daily; defer P2Y12 inhibitor and IV heparin in the setting of SCAD. Continue metoprolol, with dose escalation as heart rate and blood pressure allow. Nelva Bush, MD [facility]  DG Chest 2 View  Result Date: 12/19/2021 CLINICAL DATA:  Chest pain EXAM: CHEST - 2 VIEW COMPARISON:  None Available. FINDINGS: The heart size and mediastinal contours are within normal limits. No focal pulmonary opacity. No pleural effusion or pneumothorax. The visualized upper abdomen is unremarkable. No acute osseous abnormality. IMPRESSION: No acute cardiopulmonary abnormality. Electronically Signed   By: Beryle Flock M.D.   On: 12/19/2021 19:38   Disposition   Pt is being discharged home today in good condition.  Follow-up Plans & Appointments     Follow-up Information     Sherren Mocha, MD Follow up on 12/24/2021.   Specialty: Cardiology Why: ar 12pm for your follow up appt Contact information: [address] [place] [address] [phone]                Discharge Instructions     Amb Referral to Cardiac Rehabilitation   Complete by: As directed    Diagnosis: NSTEMI   After initial evaluation and assessments completed: Virtual Based Care may be provided alone or in conjunction with Phase 2 Cardiac Rehab based on patient barriers.: Yes   Intensive Cardiac Rehabilitation (ICR) [place] location only OR Traditional Cardiac Rehabilitation (TCR) *If criteria for ICR are not met will enroll in TCR [facility] only): Yes   Diet - low sodium heart healthy   Complete by: As directed    Discharge instructions   Complete by: As directed    Groin Site Care Refer to this sheet in the next few weeks. These instructions provide you with information on caring for yourself after your procedure. Your caregiver may also give you more specific instructions. Your treatment has been planned according to current medical  practices, but problems sometimes occur. Call your caregiver if you have any problems or questions after your procedure. HOME CARE INSTRUCTIONS You may shower 24 hours after the procedure. Remove the bandage (dressing) and gently wash the site with plain soap and water. Gently pat the site dry.  Do not apply powder or lotion to the site.  Do not sit in a bathtub, swimming pool, or whirlpool for 5 to 7 days.  No bending, squatting, or lifting anything over 10 pounds (4.5 kg) as directed by your caregiver.  Inspect the site at least twice daily.  Do not drive home if you are discharged the same day of the procedure. Have someone else drive you.  You may drive 24 hours after the procedure unless otherwise instructed by your caregiver.  What to expect: Any bruising will usually fade within 1 to 2 weeks.  Blood that collects in the tissue (hematoma) may be painful to the  touch. It should usually decrease in size and tenderness within 1 to 2 weeks.  SEEK IMMEDIATE MEDICAL CARE IF: You have unusual pain at the groin site or down the affected leg.  You have redness, warmth, swelling, or pain at the groin site.  You have drainage (other than a small amount of blood on the dressing).  You have chills.  You have a fever or persistent symptoms for more than 72 hours.  You have a fever and your symptoms suddenly get worse.  Your leg becomes pale, cool, tingly, or numb.  You have heavy bleeding from the site. Hold pressure on the site. .  Please limit physical activity over the next 3-4 weeks. No lifting over 5lbs for one week, and 10lbs for 3 weeks. No strenuous exercise for 4-6 weeks.   Increase activity slowly   Complete by: As directed         Discharge Medications   Allergies as of 12/21/2021   No Known Allergies      Medication List     STOP taking these medications    phentermine 15 MG capsule   propranolol 10 MG tablet Commonly known as: INDERAL       TAKE these  medications    acetaminophen 500 MG tablet Commonly known as: TYLENOL Take 1,000 mg by mouth every 6 (six) hours as needed for mild pain.   aspirin 81 MG tablet Take 1 tablet (81 mg total) by mouth daily.   carvedilol 3.125 MG tablet Commonly known as: COREG Take 1 tablet (3.125 mg total) by mouth 2 (two) times daily with a meal.   melatonin 1 MG Tabs tablet Take 2 mg by mouth at bedtime as needed (sleep).   nitroGLYCERIN 0.4 MG SL tablet Commonly known as: NITROSTAT Place 1 tablet (0.4 mg total) under the tongue every 5 (five) minutes x 3 doses as needed for chest pain.   rosuvastatin 20 MG tablet Commonly known as: CRESTOR Take 1 tablet (20 mg total) by mouth daily. Start taking on: December 22, 2021          Outstanding Labs/Studies   FLP/LFTs in 8 weeks   Duration of Discharge Encounter   Greater than 30 minutes including physician time.  Signed, Reino Bellis, NP 12/21/2021, 1:07 PM  I have examined the patient and reviewed assessment and plan and discussed with patient.  Agree with above as stated.     Right femoral site stable.  No hematoma.  She feels well.  She has done well walking.  Apical wall motion abnormality noted on ventriculogram.  She shows no signs of heart failure.  I suspect her LVEF will improve with time.  She had normal flow to the distal branches of her posterolateral artery and her apical LAD, both areas where perhaps SCAD was suspected based on the catheterization.   Will add statin for general preventive therapy.   She did have fatigue with metoprolol in the past.  Therefore, will discharge on carvedilol 3.125 mg twice a day as opposed to metoprolol which was initially planned.  Continue aspirin.  I also talked her about avoiding straining and very heavy lifting.  Cardio type exercises and light weights are fine.   She feels like she is ready to go home.  Will plan to discharge her later today.  She will follow-up with Dr. Burt Knack.    Larae Grooms

## 2021-12-22 LAB — LIPOPROTEIN A (LPA): Lipoprotein (a): 64.9 nmol/L — ABNORMAL HIGH (ref <75.0)

## 2021-12-24 ENCOUNTER — Encounter: Payer: Self-pay | Admitting: Cardiovascular Disease

## 2021-12-24 ENCOUNTER — Ambulatory Visit: Payer: BC Managed Care – PPO | Attending: Cardiovascular Disease | Admitting: Cardiovascular Disease

## 2021-12-24 VITALS — BP 120/80 | HR 76 | Wt 168.2 lb

## 2021-12-24 DIAGNOSIS — I214 Non-ST elevation (NSTEMI) myocardial infarction: Secondary | ICD-10-CM

## 2021-12-24 DIAGNOSIS — I2542 Coronary artery dissection: Secondary | ICD-10-CM

## 2021-12-24 MED ORDER — ROSUVASTATIN CALCIUM 20 MG PO TABS: 20.0000 mg | ORAL_TABLET | Freq: Every day | ORAL | 3 refills | Status: DC

## 2021-12-24 MED ORDER — CARVEDILOL 3.125 MG PO TABS: 3.1250 mg | ORAL_TABLET | Freq: Two times a day (BID) | ORAL | 3 refills | Status: DC

## 2021-12-24 NOTE — Progress Notes (Unsigned)
Cardiology Office Note:    Date:  12/24/2021   ID:  Tina, Juarez [birth year], MRN [id number]  PCP:  Laurey Morale, MD   [place] Prov[facility] Cardiologist:  Sherren Mocha, MD Cardiology APP:  Sharmon Revere     Referring MD: Laurey Morale, MD   Chief Complaint  Patient presents with   Coronary Artery Disease    History of Present Illness:    Tina Juarez is a 56 y.o. female presenting for hospital follow-up after recent non-STEMI.  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  She was treated conservatively and had no recurrence until her recent hospitalization last week.  She developed acute onset chest pain, was found to have nonspecific EKG changes, and her troponin was significantly elevated with a high-sensitivity troponin of 900 initially, increasing to 1760.  Cardiac catheterization demonstrated distal vessel tapering of the LAD and posterolateral branches of the RCA suggestive of spontaneous coronary dissection with intramural hematoma.  The proximal coronaries were noted to be widely patent.  The patient is here alone today.  She has done well since her hospitalization.  She has had no recurrent chest pain or pressure.  No shortness of breath.  She is tolerating the low-dose of carvedilol better than she tolerated metoprolol in the past.  She is tolerating rosuvastatin so far.  She requests referral to a scad specialist in [place] which we will do today.  The patient notes that she started taking phentermine about 6 weeks prior to her most recent event.  She had reduced the dose by 50% a few weeks before the event.  She has not resumed it since her hospitalization.  She denies any other inciting factors.  She did not do any strenuous physical activity or have any major emotional stress.  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 Carlean Purl, benign polyps, repeat in 10 yrs    LEFT HEART CATH AND CORONARY ANGIOGRAPHY N/A 12/20/2021   Procedure: LEFT HEART CATH AND CORONARY ANGIOGRAPHY;  Surgeon: Nelva Bush, MD;  Location: [facility];  Service: Cardiovascular;  Laterality: N/A;    Current Medications: Current Meds  Medication Sig   acetaminophen (TYLENOL) 500 MG tablet Take 1,000 mg by mouth every 6 (six) hours as needed for mild pain.   aspirin 81 MG tablet Take 1 tablet (81 mg total) by mouth daily.   melatonin 1 MG TABS tablet Take 2 mg by mouth at bedtime as needed (sleep).   nitroGLYCERIN (NITROSTAT) 0.4 MG SL tablet Place 1 tablet (0.4 mg total) under the tongue every 5 (five) minutes x 3 doses as needed for chest pain.   [DISCONTINUED] carvedilol (COREG) 3.125 MG tablet Take 1 tablet (3.125 mg total) by mouth 2 (two) times daily with a meal.   [DISCONTINUED] rosuvastatin (CRESTOR) 20 MG tablet Take 1 tablet (20 mg total) by mouth daily.  Allergies:   Patient has no known allergies.   Social History   Socioeconomic History   Marital status: Married    Spouse name: Not on file   Number of children: Not on file   Years of education: Not on file   Highest education level: Not on file  Occupational History   Not on file  Tobacco Use   Smoking status: Never   Smokeless tobacco: Never  Vaping Use   Vaping Use: Never used  Substance and Sexual Activity   Alcohol use: Yes    Comment: weekly - 5 a week    Drug use: Never   Sexual activity: Yes    Birth  control/protection: I.U.D.  Other Topics Concern   Not on file  Social History Narrative   Not on file   Social Determinants of Health   Financial Resource Strain: Not on file  Food Insecurity: No Food Insecurity (12/20/2021)   Hunger Vital Sign    Worried About Running Out of Food in the Last Year: Never true    Ran Out of Food in the Last Year: Never true  Transportation Needs: No Transportation Needs (12/20/2021)   PRAPARE - Hydrologist (Medical): No    Lack of Transportation (Non-Medical): No  Physical Activity: Not on file  Stress: Not on file  Social Connections: Not on file     Family History: The patient's family history includes Breast cancer in her paternal grandmother; Cancer in her father; Hyperlipidemia in her father; Mental retardation in her mother; Prostate cancer in her father. There is no history of Colon polyps, Colon cancer, Esophageal cancer, Rectal cancer, or Stomach cancer.  ROS:   Please see the history of present illness.    All other systems reviewed and are negative.  EKGs/Labs/Other Studies Reviewed:    The following studies were reviewed today: Cardiac Cath: Conclusions: Irregular tapering of apical LAD and distal segment of 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  The large/proximal coronary arteries are without significant disease. Low normal left ventricular systolic function with apical wall motion abnormality (LVEF 50-55%). Normal left ventricular filling pressure.   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  Continue indefinite aspirin 81 mg daily; defer P2Y12 inhibitor and IV heparin in the setting of SCAD. Continue metoprolol, with dose escalation as heart rate and blood pressure allow.  Echo 12/20/21: 1. Left ventricular ejection fraction, by estimation, is 50 to 55%. The  left ventricle has low  normal function. The left ventricle demonstrates  regional wall motion abnormalities (see scoring diagram/findings for  description). Left ventricular diastolic   parameters are consistent with Grade I diastolic dysfunction (impaired  relaxation).   2. Right ventricular systolic function is normal. The right ventricular  size is normal. Tricuspid regurgitation signal is inadequate for assessing  PA pressure.   3. The mitral valve is grossly normal. Trivial mitral valve  regurgitation. No evidence of mitral stenosis.   4. The aortic valve was not well visualized. Aortic valve regurgitation  is not visualized. No aortic stenosis is present.   5. The inferior vena cava is normal in size with greater than 50%  respiratory variability, suggesting right atrial pressure of 3 mmHg.   EKG:  EKG is not ordered today.    Recent Labs: 10/27/2021: ALT 17 12/20/2021: TSH 0.962 12/21/2021: BUN 12; Creatinine, Ser 0.77; Hemoglobin 12.8; Platelets 222; Potassium 3.9; Sodium 136  Recent Lipid Panel    Component Value Date/Time   CHOL 175 12/20/2021 0451   CHOL 181 05/13/2019 1049   TRIG 32 12/20/2021 0451   HDL 58 12/20/2021 0451   HDL 63 05/13/2019 1049   CHOLHDL 3.0 12/20/2021 0451   VLDL 6 12/20/2021 0451   LDLCALC 111 (H) 12/20/2021 0451   LDLCALC 100 (H) 05/13/2019 1049     Risk Assessment/Calculations:                Physical Exam:    VS:  BP 120/80   Pulse 76   Wt 168 lb 3.2 oz (76.3 kg)   SpO2 98%   BMI 26.34 kg/m     Wt Readings from Last 3 Encounters:  12/24/21 168 lb 3.2 oz (76.3 kg)  12/19/21 177 lb 14.6 oz (80.7 kg)  10/22/21 178 lb (80.7 kg)     GEN:  Well nourished, well developed in no acute distress HEENT: Normal NECK: No JVD; No carotid bruits LYMPHATICS: No lymphadenopathy CARDIAC: RRR, no murmurs, rubs, gallops RESPIRATORY:  Clear to auscultation without rales, wheezing or rhonchi  ABDOMEN: Soft, non-tender, non-distended MUSCULOSKELETAL:  No edema;  No deformity  SKIN: Warm and dry NEUROLOGIC:  Alert and oriented x 3 PSYCHIATRIC:  Normal affect   ASSESSMENT:    1. Spontaneous dissection of coronary artery   2. Non-ST elevation (NSTEMI) myocardial infarction [facility])    PLAN:    In order of problems listed above:  The patient has had his second scad event now several years after her first.  I think it is possible that phentermine may have played a role in someone who is already predisposed for such events.  She will not take this again.  She will remain on aspirin for antiplatelet therapy.  We reviewed the adverse outcomes associated with dual antiplatelet therapy following medically treated scad.  The patient is tolerating a low-dose of carvedilol which is appropriate after non-STEMI with mild LV dysfunction.  I have recommended a repeat echocardiogram in about 3 months.  I am optimistic that she will have full recovery of LV function.  I personally reviewed her coronary angiogram and her findings are actually fairly subtle this time.  I do not see an entry point or a clear dissection flap in the LAD, rather distal tapering suggestive of intramural hematoma.  There is no contrast staining.  The findings in the distal posterolateral branch are very subtle and I am not sure that I appreciate any diagnostic changes of hematoma or SCAD there. Plan: refer to Dr Tomma Rakers Bea Laura at [facility] for consultation for SCAD at patient's request.  Treated with rosuvastatin 20 mg daily.  Follow-up lipids and LFTs in 3 months.  Will refer to cardiac rehab.  {The patient has an active order for outpatient cardiac rehabilitation.   Please indicate if the patient is ready to start. Do NOT delete this.  It will auto delete.  Refresh note, then sign.              Click here to document readiness and see contraindications.  :1}  Cardiac Rehabilitation Eligibility Assessment            Medication Adjustments/Labs and Tests Ordered: Current medicines are reviewed at  length with the patient today.  Concerns regarding medicines are outlined above.  Orders Placed This Encounter  Procedures   Lipid panel   Hepatic function panel   ECHOCARDIOGRAM COMPLETE   Meds ordered this encounter  Medications   carvedilol (  COREG) 3.125 MG tablet    Sig: Take 1 tablet (3.125 mg total) by mouth 2 (two) times daily with a meal.    Dispense:  180 tablet    Refill:  3   rosuvastatin (CRESTOR) 20 MG tablet    Sig: Take 1 tablet (20 mg total) by mouth daily.    Dispense:  90 tablet    Refill:  3    Patient Instructions  Medication Instructions:  Your physician recommends that you continue on your current medications as directed. Please refer to the Current Medication list given to you today.  *If you need a refill on your cardiac medications before your next appointment, please call your pharmacy*  Lab Work: Lipids, Liver in 3 months If you have labs (blood work) drawn today and your tests are completely normal, you will receive your results only by: [place] (if you have MyChart) OR A paper copy in the mail If you have any lab test that is abnormal or we need to change your treatment, we will call you to review the results.  Testing/Procedures: ECHO (in 3 months) Your physician has requested that you have an echocardiogram. Echocardiography is a painless test that uses sound waves to create images of your heart. It provides your doctor with information about the size and shape of your heart and how well your heart's chambers and valves are working. This procedure takes approximately one hour. There are no restrictions for this procedure. Please do NOT wear cologne, perfume, aftershave, or lotions (deodorant is allowed). Please arrive 15 minutes prior to your appointment time.  Referred to Cardiac Rehab (placed and insurance verification received 12/24/21)  Ambulatory referral to [place] (SCAD specialist)  Follow-Up: At [facility], you and your health needs are our priority.  As part of our continuing mission to provide you with exceptional heart care, we have created designated Provider Care Teams.  These Care Teams include your primary Cardiologist (physician) and Advanced Practice Providers (APPs -  Physician Assistants and Nurse Practitioners) who all work together to provide you with the care you need, when you need it.  Your next appointment:   3 month(s)  The format for your next appointment:   In Person  Provider:   Sherren Mocha, MD       Important Information About Sugar         Signed, Sherren Mocha, MD  12/24/2021 3:38 PM    [place]

## 2021-12-24 NOTE — Patient Instructions (Addendum)
Medication Instructions:  Your physician recommends that you continue on your current medications as directed. Please refer to the Current Medication list given to you today.  *If you need a refill on your cardiac medications before your next appointment, please call your pharmacy*  Lab Work: Lipids, Liver in 3 months If you have labs (blood work) drawn today and your tests are completely normal, you will receive your results only by: [place] (if you have MyChart) OR A paper copy in the mail If you have any lab test that is abnormal or we need to change your treatment, we will call you to review the results.  Testing/Procedures: ECHO (in 3 months) Your physician has requested that you have an echocardiogram. Echocardiography is a painless test that uses sound waves to create images of your heart. It provides your doctor with information about the size and shape of your heart and how well your heart's chambers and valves are working. This procedure takes approximately one hour. There are no restrictions for this procedure. Please do NOT wear cologne, perfume, aftershave, or lotions (deodorant is allowed). Please arrive 15 minutes prior to your appointment time.  Referred to Cardiac Rehab (placed and insurance verification received 12/24/21)  Ambulatory referral to [place] (SCAD specialist)  Follow-Up: At [facility], you and your health needs are our priority.  As part of our continuing mission to provide you with exceptional heart care, we have created designated Provider Care Teams.  These Care Teams include your primary Cardiologist (physician) and Advanced Practice Providers (APPs -  Physician Assistants and Nurse Practitioners) who all work together to provide you with the care you need, when you need it.  Your next appointment:   3 month(s)  The format for your next appointment:   In Person  Provider:   Sherren Mocha, MD        Important Information About Sugar

## 2022-01-19 ENCOUNTER — Encounter: Payer: BC Managed Care – PPO | Attending: Family Medicine | Admitting: Dietician

## 2022-01-19 ENCOUNTER — Encounter: Payer: Self-pay | Admitting: Dietician

## 2022-01-19 DIAGNOSIS — E663 Overweight: Secondary | ICD-10-CM | POA: Insufficient documentation

## 2022-01-19 NOTE — Patient Instructions (Addendum)
Aim to include unsaturated fats and omega-3's in your diet. -liquid oils (avocado, olive), fish, nuts & seeds, avocado.   Choose low-fat dairy (cheese, yogurt, cottage cheese)  Goals: Aim for 64 oz water daily. Drink 1 of your bottles by lunch.  -Set a reminder on your phone -Keep it by your bed and drink some in the morning  Include a protein in all meals and snacks.  Goal: aim to make 1/2 of your plate vegetables and/or fruit at least 2x/day

## 2022-01-19 NOTE — Progress Notes (Signed)
Medical Nutrition Therapy  Appointment Start time:  1962 Appointment End time:  1117  Primary concerns today: Pt has had a heart attack from a spontaneous dissection of the coronary artery years ago and states although this was not preventable she now wants to make sure she does what she can to take care of her heart.   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 Medications: reviewed Labs: reviewed- 10/27/21 A1c 5.8% Notable Signs/Symptoms: none Food Allergies: none  Lifestyle & Dietary Hx  Pt reports she had another heart attack related to spontaneous coronary artery dissection in December since last visit. Pt reports she is working with cardiologist.   Pt states she is referred to cardiac rehab but has not started.  Pt states her family has been recently doing more of the shopping and cooking. Pt reports she has had lower energy levels due to her recent event.   Pt reports she has been trying to increase lean and vegetarian protein in her diet.   Pt is allowed to walk and has been walking 25 minutes daily. Pt reports she is worried about possible weight gain due to being less active.   Estimated daily fluid intake: 40-48 oz Supplements: none Sleep: 8 hours, has been having inconsistent sleep due to new medication Stress / self-care: moderate stress Current average weekly physical activity: Walks 25 min 6x/wk  24-Hr Dietary Recall First Meal: cheerios with berries and greek yogurt Snack: none  Second Meal: leftover bean and [place] chili OR lentil soup OR salad with chicken or edamame  Snack: banana, almonds OR carrots Third Meal: salmon with rice pilaf and broccoli Snack: none Beverages: coffee, wine 2-3x/wk, occasional stevia soda, water  NUTRITION DIAGNOSIS  NB-1.1 Food and  nutrition-related knowledge deficit As related to lack of prior nutrition education.  As evidenced by pt report and diet history.   NUTRITION INTERVENTION  Nutrition education (E-1) on the following topics:  Impact of stress of blood glucose Hydration needs Vegetarian protein options Strategies for cooking easy and balanced meals Importance of adequate protein at meals and snacks for healing and maintaining muscle mass.  MyPlate Building balanced snacks Unsaturated vs saturated fat Smoke points of different oils  Handouts Provided Include  Snack Sheet  Learning Style & Readiness for Change Teaching method utilized: Visual & Auditory  Demonstrated degree of understanding via: Teach Back  Barriers to learning/adherence to lifestyle change: none  Goals Established by Pt  Aim to include unsaturated fats and omega-3's in your diet. -liquid oils (avocado, olive), fish, nuts & seeds, avocado.   Choose low-fat dairy (cheese, yogurt, cottage cheese)  Goals: Aim for 64 oz water daily. Drink 1 of your bottles by lunch. - in progress -Set a reminder on your phone -Keep it by your bed and drink some in the morning  Include a protein in all meals and snacks.  Goal: aim to make 1/2 of your plate vegetables and/or fruit at least 2x/day - in progress  MONITORING & EVALUATION Dietary intake, weekly physical activity, and follow up as needed.  Next Steps  Patient is to call for questions.

## 2022-01-31 ENCOUNTER — Telehealth (HOSPITAL_COMMUNITY): Payer: Self-pay

## 2022-01-31 ENCOUNTER — Encounter (HOSPITAL_COMMUNITY): Payer: Self-pay

## 2022-01-31 NOTE — Telephone Encounter (Signed)
Called patient to see if she was interested in participating in the Cardiac Rehab Program. Patient stated yes. Patient will come in for orientation on 02/02/22'@8am'$  and will attend the 12:30pm exercise class.   Sent information via [employer]

## 2022-02-01 ENCOUNTER — Telehealth (HOSPITAL_COMMUNITY): Payer: Self-pay | Admitting: *Deleted

## 2022-02-01 NOTE — Telephone Encounter (Signed)
Called and spoke to pt to reminder of her upcoming orientation appt on tomorrow -1/31 at 8:00 am. Reviewed medical history and completed Cardiac Risk Profile assessment.  Reviewed directions and appropriate attire and footwear.  Verbalized understanding. Cherre Huger, BSN Cardiac and Training and development officer

## 2022-02-02 ENCOUNTER — Encounter (HOSPITAL_COMMUNITY)
Admission: RE | Admit: 2022-02-02 | Discharge: 2022-02-02 | Disposition: A | Discharge status: Home / Self Care | Payer: BC Managed Care – PPO | Source: Ambulatory Visit | Attending: Cardiovascular Disease | Admitting: Cardiovascular Disease

## 2022-02-02 VITALS — BP 104/70 | HR 75 | Ht 67.0 in | Wt 171.1 lb

## 2022-02-02 DIAGNOSIS — I214 Non-ST elevation (NSTEMI) myocardial infarction: Secondary | ICD-10-CM | POA: Diagnosis not present

## 2022-02-02 NOTE — Progress Notes (Signed)
Cardiac Rehab Medication Review   Does the patient  feel that his/her medications are working for him/her?  YES   Has the patient been experiencing any side effects to the medications prescribed?  YES   Does the patient measure his/her own blood pressure or blood glucose at home?   NO   Does the patient have any problems obtaining medications due to transportation or finances?   NO  Understanding of regimen: excellent Understanding of indications: excellent Potential of compliance: excellent    Comments: Tina Juarez is taking all of her medications as prescribed and has a good understanding of her regime. She does not check her BP at home, but is hoping to get a BP cuff to start. She has experienced some insomnia at night which she attributes to her carvedilol. When she has insomnia she takes '1mg'$  melatonin and says it helps.     Colbert Ewing, MS 02/02/2022 8:47 AM

## 2022-02-02 NOTE — Progress Notes (Addendum)
Tina Juarez in today for cardiac rehab orientation. Placed on monitor showed SR with inverted T Waves which are present on the most recent EKG.  Noted during walk test, PVC occasional in nature along with short runs of trigeminy. Wore holter monitor last year which showed rare PVC.  At the completion of walk test BP 108/70 (pre walk test 104/70 post 2 minute  BP 92/68 sitting with some faint light headedness. Pt given additional water. Reported she had not had her carvedilol 3.125 mg this morning.  Ate 1 piece of whole wheat toast and a banana no coffee although she does drink 1 cup of coffee a day.  Orthostatic bp check 94/72 sitting and 98/76 standing. Very Faint light headedness that improved as she stood - went completely away.  Given additional water checked bp again sitting 98/62 and standing 98/60.  Ambulated 1 lap around the track with no additional symptoms.  Advised mindfulness about her water intake and add some protein in with her breakfast.  Suggested foods provided.  Will send note to Dr. Burt Knack.  Can review strips which will be in Media tab under this appointment's encounter.

## 2022-02-02 NOTE — Progress Notes (Signed)
Cardiac Individual Treatment Plan  Patient Details  Name: Tina Juarez MRN: [id number] Date of Birth: 16-Jan-1965 Referring Provider:   Flowsheet Row INTENSIVE CARDIAC REHAB ORIENT from 02/02/2022 in [facility] for Heart, Vascular, & [place]  Referring Provider Talbot Grumbling, MD       Initial Encounter Date:  [place] from 02/02/2022 in [facility] for Heart, Vascular, & [place]  Date 02/02/22       Visit Diagnosis: NSTEMI (non-ST elevated myocardial infarction) [facility])  Patient's Home Medications on Admission:  Current Outpatient Medications:    acetaminophen (TYLENOL) 500 MG tablet, Take 1,000 mg by mouth every 6 (six) hours as needed for mild pain., Disp: , Rfl:    aspirin 81 MG tablet, Take 1 tablet (81 mg total) by mouth daily., Disp: 30 tablet, Rfl: 0   carvedilol (COREG) 3.125 MG tablet, Take 1 tablet (3.125 mg total) by mouth 2 (two) times daily with a meal., Disp: 180 tablet, Rfl: 3   melatonin 1 MG TABS tablet, Take 2 mg by mouth at bedtime as needed (sleep)., Disp: , Rfl:    nitroGLYCERIN (NITROSTAT) 0.4 MG SL tablet, Place 1 tablet (0.4 mg total) under the tongue every 5 (five) minutes x 3 doses as needed for chest pain., Disp: 25 tablet, Rfl: 1   rosuvastatin (CRESTOR) 20 MG tablet, Take 1 tablet (20 mg total) by mouth daily., Disp: 90 tablet, Rfl: 3  Past Medical History: Past Medical History:  Diagnosis Date   Cataract    removed bilat 03-2017   Myocardial infarction ([place]) 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   was living in [place] at that time (she does not have fibromuscular dysplasia) // Echo 5/22: EF 55-60, no RWMA, GLS -25.3%, normal RVSF, RVSP 20.3, trivial MR    Tobacco Use: Social History   Tobacco Use  Smoking Status Never  Smokeless Tobacco Never    Labs: Review Flowsheet  More  data may exist      Latest Ref Rng & Units 03/14/2017 05/13/2019 02/27/2020 10/27/2021 12/20/2021  Labs for ITP Cardiac and Pulmonary Rehab  Cholestrol 0 - 200 mg/dL 173  181  171  176  175   LDL (calc) 0 - 99 mg/dL 96  100  94  102  111   HDL-C >40 mg/dL 64  63  64.00  61.80  58   Trlycerides <150 mg/dL 64  99  63.0  61.0  32   Hemoglobin A1c 4.8 - 5.6 % - - - 5.8  5.7     Capillary Blood Glucose: No results found for: "GLUCAP"   Exercise Target Goals: Exercise Program Goal: Individual exercise prescription set using results from initial 6 min walk test and THRR while considering  patient's activity barriers and safety.   Exercise Prescription Goal: Initial exercise prescription builds to 30-45 minutes a day of aerobic activity, 2-3 days per week.  Home exercise guidelines will be given to patient during program as part of exercise prescription that the participant will acknowledge.  Activity Barriers & Risk Stratification:  Activity Barriers & Cardiac Risk Stratification - 02/02/22 1331       Activity Barriers & Cardiac Risk Stratification   Activity Barriers Joint Problems    Cardiac Risk Stratification High   <5 METS on 6MWT            6 Minute Walk:  6 Minute Walk     Row Name 02/02/22 1328         6 Minute Walk   Phase Initial     Distance 1440 feet     Walk Time 6 minutes     # of Rest Breaks 0     MPH 2.72     METS 3.68     RPE 10     Perceived Dyspnea  0     VO2 Peak 12.98     Symptoms Yes (comment)     Comments felt palpatations with PVCs, dizziness at end of test. no pain.     Resting HR 75 bpm     Resting BP 104/70     Resting Oxygen Saturation  98 %     Exercise Oxygen Saturation  during 6 min walk 99 %     Max Ex. HR 95 bpm     Max Ex. BP 108/76     2 Minute Post BP 92/68  BP recheck with orthostatics. sitting: 94/72. standing: 98/76              Oxygen Initial Assessment:   Oxygen Re-Evaluation:   Oxygen Discharge (Final Oxygen  Re-Evaluation):   Initial Exercise Prescription:  Initial Exercise Prescription - 02/02/22 1300       Date of Initial Exercise RX and Referring Provider   Date 02/02/22    Referring Provider Talbot Grumbling, MD    Expected Discharge Date 04/01/22      Arm Ergometer   Level 1.5    RPM 50    Minutes 15    METs 3      Recumbant Elliptical   Level 2    RPM 60    Minutes 15    METs 3      Prescription Details   Frequency (times per week) 3    Duration Progress to 30 minutes of continuous aerobic without signs/symptoms of physical distress      Intensity   THRR 40-80% of Max Heartrate 65-131    Ratings of Perceived Exertion 11-13    Perceived Dyspnea 0-4      Progression   Progression Continue to progress workloads to maintain intensity without signs/symptoms of physical distress.      Resistance Training   Training Prescription Yes    Weight 3    Reps 10-15             Perform Capillary Blood Glucose checks as needed.  Exercise Prescription Changes:   Exercise Comments:   Exercise Goals and Review:   Exercise Goals     Row Name 02/02/22 1331             Exercise Goals   Increase Physical Activity Yes       Intervention Provide advice, education, support and counseling about physical activity/exercise needs.;Develop an individualized exercise prescription for aerobic and resistive training based on initial evaluation findings, risk stratification, comorbidities and participant's personal goals.       Expected Outcomes Short Term: Attend rehab on a regular basis to increase amount of physical activity.;Long Term: Exercising regularly at least 3-5 days a week.;Long Term: Add in home exercise to make exercise part of routine and to increase amount of physical activity.       Increase Strength and Stamina Yes       Intervention Provide advice, education, support and counseling about physical activity/exercise needs.;Develop an individualized exercise  prescription for aerobic and resistive training based on initial evaluation  findings, risk stratification, comorbidities and participant's personal goals.       Expected Outcomes Short Term: Increase workloads from initial exercise prescription for resistance, speed, and METs.;Short Term: Perform resistance training exercises routinely during rehab and add in resistance training at home;Long Term: Improve cardiorespiratory fitness, muscular endurance and strength as measured by increased METs and functional capacity (6MWT)       Able to understand and use rate of perceived exertion (RPE) scale Yes       Intervention Provide education and explanation on how to use RPE scale       Expected Outcomes Short Term: Able to use RPE daily in rehab to express subjective intensity level;Long Term:  Able to use RPE to guide intensity level when exercising independently       Knowledge and understanding of Target Heart Rate Range (THRR) Yes       Intervention Provide education and explanation of THRR including how the numbers were predicted and where they are located for reference       Expected Outcomes Short Term: Able to state/look up THRR;Long Term: Able to use THRR to govern intensity when exercising independently;Short Term: Able to use daily as guideline for intensity in rehab       Understanding of Exercise Prescription Yes       Intervention Provide education, explanation, and written materials on patient's individual exercise prescription       Expected Outcomes Short Term: Able to explain program exercise prescription;Long Term: Able to explain home exercise prescription to exercise independently                Exercise Goals Re-Evaluation :   Discharge Exercise Prescription (Final Exercise Prescription Changes):   Nutrition:  Target Goals: Understanding of nutrition guidelines, daily intake of sodium '1500mg'$ , cholesterol '200mg'$ , calories 30% from fat and 7% or less from saturated fats, daily  to have 5 or more servings of fruits and vegetables.  Biometrics:  Pre Biometrics - 02/02/22 1325       Pre Biometrics   Waist Circumference 35.25 inches    Hip Circumference 43 inches    Waist to Hip Ratio 0.82 %    Triceps Skinfold 38 mm    % Body Fat 39 %    Grip Strength 34 kg    Flexibility 18.5 in    Single Leg Stand 30 seconds              Nutrition Therapy Plan and Nutrition Goals:   Nutrition Assessments:  MEDIFICTS Score Key: ?70 Need to make dietary changes  40-70 Heart Healthy Diet ? 40 Therapeutic Level Cholesterol Diet    Picture Your Plate Scores: <75 Unhealthy dietary pattern with much room for improvement. 41-50 Dietary pattern unlikely to meet recommendations for good health and room for improvement. 51-60 More healthful dietary pattern, with some room for improvement.  >60 Healthy dietary pattern, although there may be some specific behaviors that could be improved.    Nutrition Goals Re-Evaluation:   Nutrition Goals Re-Evaluation:   Nutrition Goals Discharge (Final Nutrition Goals Re-Evaluation):   Psychosocial: Target Goals: Acknowledge presence or absence of significant depression and/or stress, maximize coping skills, provide positive support system. Participant is able to verbalize types and ability to use techniques and skills needed for reducing stress and depression.  Initial Review & Psychosocial Screening:  Initial Psych Review & Screening - 02/02/22 1333       Initial Review   Current issues with Current Anxiety/Panic;Current Sleep Concerns;Current Stress  Concerns    Source of Stress Concerns Chronic Illness      Family Dynamics   Good Support System? Yes   [name] has her husband and children for support   Comments [name] has had some recent trouble falling asleep and staying asleep which she attributes to medication side effects. [name] also voiced having some anxiety regarding her health and recurring SCAD. 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 Counseling with Kirke Shaggy, PhD was introduced to her and she stated she would be interested in speaking with him for additional help.      Barriers   Psychosocial barriers to participate in program The patient should benefit from training in stress management and relaxation.      Screening Interventions   Interventions Encouraged to exercise;To provide support and resources with identified psychosocial needs;Provide feedback about the scores to participant;Other (comment)    Comments referral to Kirke Shaggy, PhD.    Expected Outcomes Short Term goal: Utilizing psychosocial counselor, staff and physician to assist with identification of specific Stressors or current issues interfering with healing process. Setting desired goal for each stressor or current issue identified.;Long Term Goal: Stressors or current issues are controlled or eliminated.;Short Term goal: Identification and review with participant of any Quality of Life or Depression concerns found by scoring the questionnaire.;Long Term goal: The participant improves quality of Life and PHQ9 Scores as seen by post scores and/or verbalization of changes             Quality of Life Scores:  Quality of Life - 02/02/22 1339       Quality of Life   Select Quality of Life      Quality of Life Scores   Health/Function Pre 21.1 %    Socioeconomic Pre 26.43 %    Psych/Spiritual Pre 25.75 %    Family Pre 27.3 %    GLOBAL Pre 24.02 %            Scores of 19 and below usually indicate a poorer quality of life in these areas.  A difference of  2-3 points is a clinically meaningful difference.  A difference of 2-3 points in the total score of the Quality of Life Index has been associated with significant improvement in overall quality of life, self-image, physical symptoms, and  general health in studies assessing change in quality of life.  PHQ-9: Review Flowsheet  More data may exist      02/02/2022 11/29/2021 10/22/2021 07/30/2021 02/27/2020  Depression screen PHQ 2/9  Decreased Interest 0 0 0 0 0  Down, Depressed, Hopeless 1 0 0 0 0  PHQ - 2 Score 1 0 0 0 0  Altered sleeping 2 - 2 0 -  Tired, decreased energy 1 - 1 0 -  Change in appetite 1 - 0 0 -  Feeling bad or failure about yourself  1 - 1 0 -  Trouble concentrating 0 - 0 0 -  Moving slowly or fidgety/restless 0 - 0 0 -  Suicidal thoughts 0 - 0 0 -  PHQ-9 Score 6 - 4 0 -  Difficult doing work/chores Somewhat difficult - Not difficult at all Not difficult at all -   Interpretation of Total Score  Total Score Depression Severity:  1-4 = Minimal depression, 5-9 = Mild depression, 10-14 = Moderate depression, 15-19 = Moderately severe depression, 20-27 =  Severe depression   Psychosocial Evaluation and Intervention:   Psychosocial Re-Evaluation:   Psychosocial Discharge (Final Psychosocial Re-Evaluation):   Vocational Rehabilitation: Provide vocational rehab assistance to qualifying candidates.   Vocational Rehab Evaluation & Intervention:  Vocational Rehab - 02/02/22 1340       Initial Vocational Rehab Evaluation & Intervention   Assessment shows need for Vocational Rehabilitation No   [name] is currently working and does not need vocational rehab at this time            Education: Education Goals: Education classes will be provided on a weekly basis, covering required topics. Participant will state understanding/return demonstration of topics presented.     Core Videos: Exercise    Move It!  Clinical staff conducted group or individual video education with verbal and written material and guidebook.  Patient learns the recommended Pritikin exercise program. Exercise with the goal of living a long, healthy life. Some of the health benefits of exercise include controlled diabetes,  healthier blood pressure levels, improved cholesterol levels, improved heart and lung capacity, improved sleep, and better body composition. Everyone should speak with their doctor before starting or changing an exercise routine.  Biomechanical Limitations Clinical staff conducted group or individual video education with verbal and written material and guidebook.  Patient learns how biomechanical limitations can impact exercise and how we can mitigate and possibly overcome limitations to have an impactful and balanced exercise routine.  Body Composition Clinical staff conducted group or individual video education with verbal and written material and guidebook.  Patient learns that body composition (ratio of muscle mass to fat mass) is a key component to assessing overall fitness, rather than body weight alone. Increased fat mass, especially visceral belly fat, can put [place] at increased risk for metabolic syndrome, type 2 diabetes, heart disease, and even death. It is recommended to combine diet and exercise (cardiovascular and resistance training) to improve your body composition. Seek guidance from your physician and exercise physiologist before implementing an exercise routine.  Exercise Action Plan Clinical staff conducted group or individual video education with verbal and written material and guidebook.  Patient learns the recommended strategies to achieve and enjoy long-term exercise adherence, including variety, self-motivation, self-efficacy, and positive decision making. Benefits of exercise include fitness, good health, weight management, more energy, better sleep, less stress, and overall well-being.  Medical   Heart Disease Risk Reduction Clinical staff conducted group or individual video education with verbal and written material and guidebook.  Patient learns our heart is our most vital organ as it circulates oxygen, nutrients, white blood cells, and hormones throughout the entire body,  and carries waste away. Data supports a plant-based eating plan like the Pritikin Program for its effectiveness in slowing progression of and reversing heart disease. The video provides a number of recommendations to address heart disease.   Metabolic Syndrome and Belly Fat  Clinical staff conducted group or individual video education with verbal and written material and guidebook.  Patient learns what metabolic syndrome is, how it leads to heart disease, and how one can reverse it and keep it from coming back. You have metabolic syndrome if you have 3 of the following 5 criteria: abdominal obesity, high blood pressure, high triglycerides, low HDL cholesterol, and high blood sugar.  Hypertension and Heart Disease Clinical staff conducted group or individual video education with verbal and written material and guidebook.  Patient learns that high blood pressure, or hypertension, is very common in the [place]. Hypertension is largely due  to excessive salt intake, but other important risk factors include being overweight, physical inactivity, drinking too much alcohol, smoking, and not eating enough potassium from fruits and vegetables. High blood pressure is a leading risk factor for heart attack, stroke, congestive heart failure, dementia, kidney failure, and premature death. Long-term effects of excessive salt intake include stiffening of the arteries and thickening of heart muscle and organ damage. Recommendations include ways to reduce hypertension and the risk of heart disease.  Diseases of Our Time - Focusing on Diabetes Clinical staff conducted group or individual video education with verbal and written material and guidebook.  Patient learns why the best way to stop diseases of our time is prevention, through food and other lifestyle changes. Medicine (such as prescription pills and surgeries) is often only a Band-Aid on the problem, not a long-term solution. Most common diseases of our time  include obesity, type 2 diabetes, hypertension, heart disease, and cancer. The Pritikin Program is recommended and has been proven to help reduce, reverse, and/or prevent the damaging effects of metabolic syndrome.  Nutrition   Overview of the Pritikin Eating Plan  Clinical staff conducted group or individual video education with verbal and written material and guidebook.  Patient learns about the [place] for disease risk reduction. The [place] emphasizes a wide variety of unrefined, minimally-processed carbohydrates, like fruits, vegetables, whole grains, and legumes. Go, Caution, and Stop food choices are explained. Plant-based and lean animal proteins are emphasized. Rationale provided for low sodium intake for blood pressure control, low added sugars for blood sugar stabilization, and low added fats and oils for coronary artery disease risk reduction and weight management.  Calorie Density  Clinical staff conducted group or individual video education with verbal and written material and guidebook.  Patient learns about calorie density and how it impacts the Pritikin Eating Plan. Knowing the characteristics of the food you choose will help you decide whether those foods will lead to weight gain or weight loss, and whether you want to consume more or less of them. Weight loss is usually a side effect of the Pritikin Eating Plan because of its focus on low calorie-dense foods.  Label Reading  Clinical staff conducted group or individual video education with verbal and written material and guidebook.  Patient learns about the Pritikin recommended label reading guidelines and corresponding recommendations regarding calorie density, added sugars, sodium content, and whole grains.  Dining Out - Part 1  Clinical staff conducted group or individual video education with verbal and written material and guidebook.  Patient learns that restaurant meals can be sabotaging because they  can be so high in calories, fat, sodium, and/or sugar. Patient learns recommended strategies on how to positively address this and avoid unhealthy pitfalls.  Facts on Fats  Clinical staff conducted group or individual video education with verbal and written material and guidebook.  Patient learns that lifestyle modifications can be just as effective, if not more so, as many medications for lowering your risk of heart disease. A Pritikin lifestyle can help to reduce your risk of inflammation and atherosclerosis (cholesterol build-up, or plaque, in the artery walls). Lifestyle interventions such as dietary choices and physical activity address the cause of atherosclerosis. A review of the types of fats and their impact on blood cholesterol levels, along with dietary recommendations to reduce fat intake is also included.  Nutrition Action Plan  Clinical staff conducted group or individual video education with verbal and written material and guidebook.  Patient  learns how to incorporate Pritikin recommendations into their lifestyle. Recommendations include planning and keeping personal health goals in mind as an important part of their success.  Healthy Mind-Set    Healthy Minds, Bodies, Hearts  Clinical staff conducted group or individual video education with verbal and written material and guidebook.  Patient learns how to identify when they are stressed. Video will discuss the impact of that stress, as well as the many benefits of stress management. Patient will also be introduced to stress management techniques. The way we think, act, and feel has an impact on our hearts.  How Our Thoughts Can Heal Our Hearts  Clinical staff conducted group or individual video education with verbal and written material and guidebook.  Patient learns that negative thoughts can cause depression and anxiety. This can result in negative lifestyle behavior and serious health problems. Cognitive behavioral therapy is an  effective method to help control our thoughts in order to change and improve our emotional outlook.  Additional Videos:  Exercise    Improving Performance  Clinical staff conducted group or individual video education with verbal and written material and guidebook.  Patient learns to use a non-linear approach by alternating intensity levels and lengths of time spent exercising to help burn more calories and lose more body fat. Cardiovascular exercise helps improve heart health, metabolism, hormonal balance, blood sugar control, and recovery from fatigue. Resistance training improves strength, endurance, balance, coordination, reaction time, metabolism, and muscle mass. Flexibility exercise improves circulation, posture, and balance. Seek guidance from your physician and exercise physiologist before implementing an exercise routine and learn your capabilities and proper form for all exercise.  Introduction to Yoga  Clinical staff conducted group or individual video education with verbal and written material and guidebook.  Patient learns about yoga, a discipline of the coming together of mind, breath, and body. The benefits of yoga include improved flexibility, improved range of motion, better posture and core strength, increased lung function, weight loss, and positive self-image. Yoga's heart health benefits include lowered blood pressure, healthier heart rate, decreased cholesterol and triglyceride levels, improved immune function, and reduced stress. Seek guidance from your physician and exercise physiologist before implementing an exercise routine and learn your capabilities and proper form for all exercise.  Medical   Aging: Enhancing Your Quality of Life  Clinical staff conducted group or individual video education with verbal and written material and guidebook.  Patient learns key strategies and recommendations to stay in good physical health and enhance quality of life, such as prevention  strategies, having an advocate, securing a [place], and keeping a list of medications and system for tracking them. It also discusses how to avoid risk for bone loss.  Biology of Weight Control  Clinical staff conducted group or individual video education with verbal and written material and guidebook.  Patient learns that weight gain occurs because we consume more calories than we burn (eating more, moving less). Even if your body weight is normal, you may have higher ratios of fat compared to muscle mass. Too much body fat puts you at increased risk for cardiovascular disease, heart attack, stroke, type 2 diabetes, and obesity-related cancers. In addition to exercise, following the [place] can help reduce your risk.  Decoding Lab Results  Clinical staff conducted group or individual video education with verbal and written material and guidebook.  Patient learns that lab test reflects one measurement whose values change over time and are influenced by  many factors, including medication, stress, sleep, exercise, food, hydration, pre-existing medical conditions, and more. It is recommended to use the knowledge from this video to become more involved with your lab results and evaluate your numbers to speak with your doctor.   Diseases of Our Time - Overview  Clinical staff conducted group or individual video education with verbal and written material and guidebook.  Patient learns that according to the CDC, 50% to 70% of chronic diseases (such as obesity, type 2 diabetes, elevated lipids, hypertension, and heart disease) are avoidable through lifestyle improvements including healthier food choices, listening to satiety cues, and increased physical activity.  Sleep Disorders Clinical staff conducted group or individual video education with verbal and written material and guidebook.  Patient learns how good quality and duration of sleep are important to  overall health and well-being. Patient also learns about sleep disorders and how they impact health along with recommendations to address them, including discussing with a physician.  Nutrition  Dining Out - Part 2 Clinical staff conducted group or individual video education with verbal and written material and guidebook.  Patient learns how to plan ahead and communicate in order to maximize their dining experience in a healthy and nutritious manner. Included are recommended food choices based on the type of restaurant the patient is visiting.   Fueling a Best boy conducted group or individual video education with verbal and written material and guidebook.  There is a strong connection between our food choices and our health. Diseases like obesity and type 2 diabetes are very prevalent and are in large-part due to lifestyle choices. The Pritikin Eating Plan provides plenty of food and hunger-curbing satisfaction. It is easy to follow, affordable, and helps reduce health risks.  Menu Workshop  Clinical staff conducted group or individual video education with verbal and written material and guidebook.  Patient learns that restaurant meals can sabotage health goals because they are often packed with calories, fat, sodium, and sugar. Recommendations include strategies to plan ahead and to communicate with the manager, chef, or server to help order a healthier meal.  Planning Your Eating Strategy  Clinical staff conducted group or individual video education with verbal and written material and guidebook.  Patient learns about the [place] and its benefit of reducing the risk of disease. The [place] does not focus on calories. Instead, it emphasizes high-quality, nutrient-rich foods. By knowing the characteristics of the foods, we choose, we can determine their calorie density and make informed decisions.  Targeting Your Nutrition Priorities  Clinical staff  conducted group or individual video education with verbal and written material and guidebook.  Patient learns that lifestyle habits have a tremendous impact on disease risk and progression. This video provides eating and physical activity recommendations based on your personal health goals, such as reducing LDL cholesterol, losing weight, preventing or controlling type 2 diabetes, and reducing high blood pressure.  Vitamins and Minerals  Clinical staff conducted group or individual video education with verbal and written material and guidebook.  Patient learns different ways to obtain key vitamins and minerals, including through a recommended healthy diet. It is important to discuss all supplements you take with your doctor.   Healthy Mind-Set    Smoking Cessation  Clinical staff conducted group or individual video education with verbal and written material and guidebook.  Patient learns that cigarette smoking and tobacco addiction pose a serious health risk which affects millions of people. Stopping smoking will significantly  reduce the risk of heart disease, lung disease, and many forms of cancer. Recommended strategies for quitting are covered, including working with your doctor to develop a successful plan.  Culinary   Becoming a Financial trader conducted group or individual video education with verbal and written material and guidebook.  Patient learns that cooking at home can be healthy, cost-effective, quick, and puts them in control. Keys to cooking healthy recipes will include looking at your recipe, assessing your equipment needs, planning ahead, making it simple, choosing cost-effective seasonal ingredients, and limiting the use of added fats, salts, and sugars.  Cooking - Breakfast and Snacks  Clinical staff conducted group or individual video education with verbal and written material and guidebook.  Patient learns how important breakfast is to satiety and nutrition  through the entire day. Recommendations include key foods to eat during breakfast to help stabilize blood sugar levels and to prevent overeating at meals later in the day. Planning ahead is also a key component.  Cooking - Human resources officer conducted group or individual video education with verbal and written material and guidebook.  Patient learns eating strategies to improve overall health, including an approach to cook more at home. Recommendations include thinking of animal protein as a side on your plate rather than center stage and focusing instead on lower calorie dense options like vegetables, fruits, whole grains, and plant-based proteins, such as beans. Making sauces in large quantities to freeze for later and leaving the skin on your vegetables are also recommended to maximize your experience.  Cooking - Healthy Salads and Dressing Clinical staff conducted group or individual video education with verbal and written material and guidebook.  Patient learns that vegetables, fruits, whole grains, and legumes are the foundations of the [place]. Recommendations include how to incorporate each of these in flavorful and healthy salads, and how to create homemade salad dressings. Proper handling of ingredients is also covered. Cooking - Soups and [employer] - Soups and Desserts Clinical staff conducted group or individual video education with verbal and written material and guidebook.  Patient learns that Pritikin soups and desserts make for easy, nutritious, and delicious snacks and meal components that are low in sodium, fat, sugar, and calorie density, while high in vitamins, minerals, and filling fiber. Recommendations include simple and healthy ideas for soups and desserts.   Overview     The Pritikin Solution Program Overview Clinical staff conducted group or individual video education with verbal and written material and guidebook.  Patient learns that the  results of the [place] Program have been documented in more than 100 articles published in peer-reviewed journals, and the benefits include reducing risk factors for (and, in some cases, even reversing) high cholesterol, high blood pressure, type 2 diabetes, obesity, and more! An overview of the three key pillars of the Pritikin Program will be covered: eating well, doing regular exercise, and having a healthy mind-set.  WORKSHOPS  Exercise: Exercise Basics: Building Your Action Plan Clinical staff led group instruction and group discussion with PowerPoint presentation and patient guidebook. To enhance the learning environment the use of posters, models and videos may be added. At the conclusion of this workshop, patients will comprehend the difference between physical activity and exercise, as well as the benefits of incorporating both, into their routine. Patients will understand the FITT (Frequency, Intensity, Time, and Type) principle and how to use it to build an exercise action plan. In addition, safety concerns  and other considerations for exercise and cardiac rehab will be addressed by the presenter. The purpose of this lesson is to promote a comprehensive and effective weekly exercise routine in order to improve patients' overall level of fitness.   Managing Heart Disease: Your Path to a Healthier Heart Clinical staff led group instruction and group discussion with PowerPoint presentation and patient guidebook. To enhance the learning environment the use of posters, models and videos may be added.At the conclusion of this workshop, patients will understand the anatomy and physiology of the heart. Additionally, they will understand how Pritikin's three pillars impact the risk factors, the progression, and the management of heart disease.  The purpose of this lesson is to provide a high-level overview of the heart, heart disease, and how the Pritikin lifestyle positively impacts risk  factors.  Exercise Biomechanics Clinical staff led group instruction and group discussion with PowerPoint presentation and patient guidebook. To enhance the learning environment the use of posters, models and videos may be added. Patients will learn how the structural parts of their bodies function and how these functions impact their daily activities, movement, and exercise. Patients will learn how to promote a neutral spine, learn how to manage pain, and identify ways to improve their physical movement in order to promote healthy living. The purpose of this lesson is to expose patients to common physical limitations that impact physical activity. Participants will learn practical ways to adapt and manage aches and pains, and to minimize their effect on regular exercise. Patients will learn how to maintain good posture while sitting, walking, and lifting.  Balance Training and Fall Prevention  Clinical staff led group instruction and group discussion with PowerPoint presentation and patient guidebook. To enhance the learning environment the use of posters, models and videos may be added. At the conclusion of this workshop, patients will understand the importance of their sensorimotor skills (vision, proprioception, and the vestibular system) in maintaining their ability to balance as they age. Patients will apply a variety of balancing exercises that are appropriate for their current level of function. Patients will understand the common causes for poor balance, possible solutions to these problems, and ways to modify their physical environment in order to minimize their fall risk. The purpose of this lesson is to teach patients about the importance of maintaining balance as they age and ways to minimize their risk of falling.  WORKSHOPS   Nutrition:  Fueling a Scientist, research (physical sciences) led group instruction and group discussion with PowerPoint presentation and patient guidebook. To enhance  the learning environment the use of posters, models and videos may be added. Patients will review the foundational principles of the [place] and understand what constitutes a serving size in each of the food groups. Patients will also learn Pritikin-friendly foods that are better choices when away from home and review make-ahead meal and snack options. Calorie density will be reviewed and applied to three nutrition priorities: weight maintenance, weight loss, and weight gain. The purpose of this lesson is to reinforce (in a group setting) the key concepts around what patients are recommended to eat and how to apply these guidelines when away from home by planning and selecting Pritikin-friendly options. Patients will understand how calorie density may be adjusted for different weight management goals.  Mindful Eating  Clinical staff led group instruction and group discussion with PowerPoint presentation and patient guidebook. To enhance the learning environment the use of posters, models and videos may be added. Patients will briefly  review the concepts of the [place] and the importance of low-calorie dense foods. The concept of mindful eating will be introduced as well as the importance of paying attention to internal hunger signals. Triggers for non-hunger eating and techniques for dealing with triggers will be explored. The purpose of this lesson is to provide patients with the opportunity to review the basic principles of the [place], discuss the value of eating mindfully and how to measure internal cues of hunger and fullness using the Hunger Scale. Patients will also discuss reasons for non-hunger eating and learn strategies to use for controlling emotional eating.  Targeting Your Nutrition Priorities Clinical staff led group instruction and group discussion with PowerPoint presentation and patient guidebook. To enhance the learning environment the use of posters,  models and videos may be added. Patients will learn how to determine their genetic susceptibility to disease by reviewing their family history. Patients will gain insight into the importance of diet as part of an overall healthy lifestyle in mitigating the impact of genetics and other environmental insults. The purpose of this lesson is to provide patients with the opportunity to assess their personal nutrition priorities by looking at their family history, their own health history and current risk factors. Patients will also be able to discuss ways of prioritizing and modifying the [place] for their highest risk areas  Menu  Clinical staff led group instruction and group discussion with PowerPoint presentation and patient guidebook. To enhance the learning environment the use of posters, models and videos may be added. Using menus brought in from [employer], or printed from [employer], patients will apply the [place] dining out guidelines that were presented in the [employer] video. Patients will also be able to practice these guidelines in a variety of provided scenarios. The purpose of this lesson is to provide patients with the opportunity to practice hands-on learning of the [place] with actual menus and practice scenarios.  Label Reading Clinical staff led group instruction and group discussion with PowerPoint presentation and patient guidebook. To enhance the learning environment the use of posters, models and videos may be added. Patients will review and discuss the Pritikin label reading guidelines presented in Pritikin's Label Reading Educational series video. Using fool labels brought in from local grocery stores and markets, patients will apply the label reading guidelines and determine if the packaged food meet the Pritikin guidelines. The purpose of this lesson is to provide patients with the opportunity to review, discuss, and  practice hands-on learning of the Pritikin Label Reading guidelines with actual packaged food labels. [place] Workshops are designed to teach patients ways to prepare quick, simple, and affordable recipes at home. The importance of nutrition's role in chronic disease risk reduction is reflected in its emphasis in the overall Pritikin program. By learning how to prepare essential core Pritikin Eating Plan recipes, patients will increase control over what they eat; be able to customize the flavor of foods without the use of added salt, sugar, or fat; and improve the quality of the food they consume. By learning a set of core recipes which are easily assembled, quickly prepared, and affordable, patients are more likely to prepare more healthy foods at home. These workshops focus on convenient breakfasts, simple entres, side dishes, and desserts which can be prepared with minimal effort and are consistent with nutrition recommendations for cardiovascular risk reduction. Cooking [employer] are taught by a  chef or registered dietitian (RD) who has been trained by the [employer] team. The chef or RD has a clear understanding of the importance of minimizing - if not completely eliminating - added fat, sugar, and sodium in recipes. Throughout the series of [place] Workshop sessions, patients will learn about healthy ingredients and efficient methods of cooking to build confidence in their capability to prepare    Cooking School weekly topics:  Adding Flavor- Sodium-Free  Fast and Healthy Breakfasts  Powerhouse Plant-Based Proteins  Satisfying Salads and Dressings  Simple Sides and Sauces  International Cuisine-Spotlight on the [place] Zones  Delicious Desserts  Savory Soups  Efficiency Cooking - Meals in a Snap  Tasty Appetizers and Snacks  Comforting Weekend Breakfasts  One-Pot Wonders   Fast Evening Meals  Easy [employer] (Psychosocial): New Thoughts, New Behaviors Clinical staff led group instruction and group discussion with PowerPoint presentation and patient guidebook. To enhance the learning environment the use of posters, models and videos may be added. Patients will learn and practice techniques for developing effective health and lifestyle goals. Patients will be able to effectively apply the goal setting process learned to develop at least one new personal goal.  The purpose of this lesson is to expose patients to a new skill set of behavior modification techniques such as techniques setting SMART goals, overcoming barriers, and achieving new thoughts and new behaviors.  Managing Moods and Relationships Clinical staff led group instruction and group discussion with PowerPoint presentation and patient guidebook. To enhance the learning environment the use of posters, models and videos may be added. Patients will learn how emotional and chronic stress factors can impact their health and relationships. They will learn healthy ways to manage their moods and utilize positive coping mechanisms. In addition, ICR patients will learn ways to improve communication skills. The purpose of this lesson is to expose patients to ways of understanding how one's mood and health are intimately connected. Developing a healthy outlook can help build positive relationships and connections with others. Patients will understand the importance of utilizing effective communication skills that include actively listening and being heard. They will learn and understand the importance of the "4 Cs" and especially Connections in fostering of a Healthy Mind-Set.  Healthy Sleep for a Healthy Heart Clinical staff led group instruction and group discussion with PowerPoint presentation and patient guidebook. To enhance the learning environment the use of posters, models and videos may be added. At the  conclusion of this workshop, patients will be able to demonstrate knowledge of the importance of sleep to overall health, well-being, and quality of life. They will understand the symptoms of, and treatments for, common sleep disorders. Patients will also be able to identify daytime and nighttime behaviors which impact sleep, and they will be able to apply these tools to help manage sleep-related challenges. The purpose of this lesson is to provide patients with a general overview of sleep and outline the importance of quality sleep. Patients will learn about a few of the most common sleep disorders. Patients will also be introduced to the concept of "sleep hygiene," and discover ways to self-manage certain sleeping problems through simple daily behavior changes. Finally, the workshop will motivate patients by clarifying the links between quality sleep and their goals of heart-healthy living.   Recognizing and Reducing Stress Clinical staff led group instruction and group discussion with PowerPoint presentation and patient guidebook. To enhance the  learning environment the use of posters, models and videos may be added. At the conclusion of this workshop, patients will be able to understand the types of stress reactions, differentiate between acute and chronic stress, and recognize the impact that chronic stress has on their health. They will also be able to apply different coping mechanisms, such as reframing negative self-talk. Patients will have the opportunity to practice a variety of stress management techniques, such as deep abdominal breathing, progressive muscle relaxation, and/or guided imagery.  The purpose of this lesson is to educate patients on the role of stress in their lives and to provide healthy techniques for coping with it.  Learning Barriers/Preferences:  Learning Barriers/Preferences - 02/02/22 1339       Learning Barriers/Preferences   Learning Barriers None    Learning Preferences  Audio;Group Instruction;Computer/Internet;Individual Instruction;Pictoral;Skilled Demonstration;Video;Written Material;Verbal Instruction             Education Topics:  Knowledge Questionnaire Score:  Knowledge Questionnaire Score - 02/02/22 1340       Knowledge Questionnaire Score   Pre Score 23/24             Core Components/Risk Factors/Patient Goals at Admission:  Personal Goals and Risk Factors at Admission - 02/02/22 1340       Core Components/Risk Factors/Patient Goals on Admission    Weight Management Yes;Weight Loss    Intervention Weight Management: Develop a combined nutrition and exercise program designed to reach desired caloric intake, while maintaining appropriate intake of nutrient and fiber, sodium and fats, and appropriate energy expenditure required for the weight goal.;Weight Management: Provide education and appropriate resources to help participant work on and attain dietary goals.    Goal Weight: Short Term 160 lb (72.6 kg)    Expected Outcomes Short Term: Continue to assess and modify interventions until short term weight is achieved;Long Term: Adherence to nutrition and physical activity/exercise program aimed toward attainment of established weight goal;Weight Loss: Understanding of general recommendations for a balanced deficit meal plan, which promotes 1-2 lb weight loss per week and includes a negative energy balance of [id number] kcal/d;Understanding recommendations for meals to include 15-35% energy as protein, 25-35% energy from fat, 35-60% energy from carbohydrates, less than '200mg'$  of dietary cholesterol, 20-35 gm of total fiber daily;Understanding of distribution of calorie intake throughout the day with the consumption of 4-5 meals/snacks    Hypertension Yes    Intervention Monitor prescription use compliance.;Provide education on lifestyle modifcations including regular physical activity/exercise, weight management, moderate sodium restriction and  increased consumption of fresh fruit, vegetables, and low fat dairy, alcohol moderation, and smoking cessation.    Expected Outcomes Long Term: Maintenance of blood pressure at goal levels.;Short Term: Continued assessment and intervention until BP is < 140/33m HG in hypertensive participants. < 130/830mHG in hypertensive participants with diabetes, heart failure or chronic kidney disease.    Lipids Yes    Intervention Provide education and support for participant on nutrition & aerobic/resistive exercise along with prescribed medications to achieve LDL '70mg'$ , HDL >'40mg'$ .    Expected Outcomes Long Term: Cholesterol controlled with medications as prescribed, with individualized exercise RX and with personalized nutrition plan. Value goals: LDL < '70mg'$ , HDL > 40 mg.;Short Term: Participant states understanding of desired cholesterol values and is compliant with medications prescribed. Participant is following exercise prescription and nutrition guidelines.    Stress Yes    Intervention Offer individual and/or small group education and counseling on adjustment to heart disease, stress management and health-related lifestyle change. Teach  and support self-help strategies.;Refer participants experiencing significant psychosocial distress to appropriate mental health specialists for further evaluation and treatment. When possible, include family members and significant others in education/counseling sessions.    Expected Outcomes Short Term: Participant demonstrates changes in health-related behavior, relaxation and other stress management skills, ability to obtain effective social support, and compliance with psychotropic medications if prescribed.;Long Term: Emotional wellbeing is indicated by absence of clinically significant psychosocial distress or social isolation.             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 Intorduction to [place] education program/ intensive cardiac rehab. Initial orientation packet reviewed with patient.                Comments: Participant attended orientation for the cardiac rehabilitation program on  02/02/2022  to perform initial intake and exercise walk test. Patient introduced to the [facility] education and orientation packet was reviewed. Completed 6-minute walk test, measurements, initial ITP, and exercise prescription. Vital signs stable. Telemetry-normal sinus rhythm with inverted T-waves and occasional PVCs, mildly symptomatic. Pt had dizziness after completing walk test accompanied with low BPs and palpitations.  See note from RN Carlette from this encounter for further details.    Service time was from 802 to 1010.

## 2022-02-03 ENCOUNTER — Telehealth (HOSPITAL_COMMUNITY): Payer: Self-pay | Admitting: *Deleted

## 2022-02-03 NOTE — Telephone Encounter (Signed)
-----  Message from Sherren Mocha, MD sent at 02/02/2022  4:26 PM EST ----- Reviewed strips and see PVC's. OK to continue with things. Let me know if faint feelings/lightheadedness continue to limit her ----- Message ----- From: Rowe Pavy, RN Sent: 02/02/2022  11:19 AM EST To: Sherren Mocha, MD  Hey Dr. Burt Knack, Please review my note from today's orientation appt.  Strips are loaded in media under the Cardiac Rehab appointment tab. Thanks - we are looking forward to working with [name]! Juanantonio Stolar

## 2022-02-07 ENCOUNTER — Encounter: Payer: Self-pay | Admitting: Cardiovascular Disease

## 2022-02-09 ENCOUNTER — Encounter (HOSPITAL_COMMUNITY)
Admission: RE | Admit: 2022-02-09 | Discharge: 2022-02-09 | Disposition: A | Discharge status: Home / Self Care | Payer: BC Managed Care – PPO | Source: Ambulatory Visit | Attending: Cardiovascular Disease | Admitting: Cardiovascular Disease

## 2022-02-09 DIAGNOSIS — I214 Non-ST elevation (NSTEMI) myocardial infarction: Secondary | ICD-10-CM | POA: Diagnosis present

## 2022-02-09 DIAGNOSIS — Z48812 Encounter for surgical aftercare following surgery on the circulatory system: Secondary | ICD-10-CM | POA: Insufficient documentation

## 2022-02-09 DIAGNOSIS — I252 Old myocardial infarction: Secondary | ICD-10-CM | POA: Diagnosis not present

## 2022-02-09 NOTE — Progress Notes (Signed)
Daily Session Note  Patient Details  Name: Tina Juarez MRN: [id number] Date of Birth: 01-Sep-1965 Referring Provider:   Flowsheet Row INTENSIVE CARDIAC REHAB ORIENT from 02/02/2022 in [facility]  Referring Provider Talbot Grumbling, MD       Encounter Date: 02/09/2022  Check In:  Session Check In - 02/09/22 1256       Check-In   Supervising physician immediately available to respond to emergencies [facility] MD immediately available    Physician(s) Diona Browner, NP    Location [facility]    Staff Present Lesly Rubenstein, MS, ACSM-CEP, CCRP, Exercise Physiologist;Jetta Gilford Rile BS, ACSM-CEP, Exercise Physiologist;Jiyah Torpey, RN, BSN;Johnny Porter, MS, Exercise Physiologist    Virtual Visit No    Medication changes reported     No    Fall or balance concerns reported    No    Tobacco Cessation No Change    Warm-up and Cool-down Performed as group-led instruction   CRP2 orientation   Resistance Training Performed No    VAD Patient? No    PAD/SET Patient? No      Pain Assessment   Currently in Pain? No/denies    Pain Score 0-No pain    Multiple Pain Sites No             Capillary Blood Glucose: No results found for this or any previous visit (from the past 24 hour(s)).   Exercise Prescription Changes - 02/09/22 1400       Response to Exercise   Blood Pressure (Admit) 100/64    Blood Pressure (Exercise) 126/80    Blood Pressure (Exit) 100/64    Heart Rate (Admit) 69 bpm    Heart Rate (Exercise) 89 bpm    Heart Rate (Exit) 68 bpm    Rating of Perceived Exertion (Exercise) 13    Symptoms None    Comments Pt's first day in the CRP2 program    Duration Continue with 30 min of aerobic exercise without signs/symptoms of physical distress.    Intensity THRR unchanged      Progression   Progression Continue to progress workloads to maintain intensity without signs/symptoms of physical  distress.    Average METs 3.4      Resistance Training   Training Prescription Yes    Weight No weights on Wednesday    Reps 10-15    Time 10 Minutes      Interval Training   Interval Training No      Arm Ergometer   Level 1.5    Watts 3    Minutes 15      Recumbant Elliptical   Level 1    RPM 60    Watts 73    Minutes 15    METs 3.4             Social History   Tobacco Use  Smoking Status Never  Smokeless Tobacco Never    Goals Met:  Exercise tolerated well No report of concerns or symptoms today Strength training completed today  Goals Unmet:  Not Applicable  Comments: Pt started cardiac rehab today.  Pt tolerated light exercise without difficulty. VSS, telemetry-Sinus Rhythm, asymptomatic.  Medication list reconciled. Pt denies barriers to medicaiton compliance.  PSYCHOSOCIAL ASSESSMENT:  PHQ-6. Pt exhibits positive coping skills, hopeful outlook with supportive family. No psychosocial needs identified at this time, no psychosocial interventions necessary.    Pt enjoys walking, pilate's, hiking, movies, family friends and cooking.  Pt oriented to exercise equipment and routine.    Understanding verbalized. Harrell Gave RN BSN    Dr. Fransico Him is Medical Director for Cardiac Rehab at [facility].

## 2022-02-11 ENCOUNTER — Encounter (HOSPITAL_COMMUNITY)
Admission: RE | Admit: 2022-02-11 | Discharge: 2022-02-11 | Disposition: A | Discharge status: Home / Self Care | Payer: BC Managed Care – PPO | Source: Ambulatory Visit | Attending: Cardiovascular Disease | Admitting: Cardiovascular Disease

## 2022-02-11 DIAGNOSIS — I214 Non-ST elevation (NSTEMI) myocardial infarction: Secondary | ICD-10-CM

## 2022-02-11 NOTE — Progress Notes (Signed)
Incomplete Session Note  Patient Details  Name: Tina Juarez MRN: [id number] Date of Birth: 22-Jul-1965 Referring Provider:   Flowsheet Row INTENSIVE CARDIAC REHAB ORIENT from 02/02/2022 in [facility] [place]  Referring Provider Talbot Grumbling, MD       [name] did not complete her rehab session.  [name] said that she did not sleep well last night and went to sleep after 3:30 in the morning. Patient reported feeling lightheaded during warm up and look fatigued. Exercise stopped. Sitting blood pressure 112/72 sitting. Standing blood pressure 106/80 resting heart rate 59. [name] said that she drank a 8 ounce glasses of water today. Patient drank water. Repeat blood pressure 122/80 sitting. Standing blood pressure 114/84. I advised [name] not to exercise today since she feels fatigued. Denies chest pain or other symptoms. Patient became tearful admits to experiencing some depression since her NSTEMI. Emotional support provided. Dr Thereasa Distance office called. Appointment made for Monday to discuss depression. [name] also says she is interested in receiving counseling. I talked with [name] about alternative methods to help with sleep chamomile tea, a sleep mask, relaxation music, sleeping in a dark room and making sure her electronic devices are turned off at least an hour before going to bed.Harrell Gave RN BSN

## 2022-02-14 ENCOUNTER — Encounter (HOSPITAL_COMMUNITY)
Admission: RE | Admit: 2022-02-14 | Discharge: 2022-02-14 | Disposition: A | Discharge status: Home / Self Care | Payer: BC Managed Care – PPO | Source: Ambulatory Visit | Attending: Cardiovascular Disease | Admitting: Cardiovascular Disease

## 2022-02-14 ENCOUNTER — Encounter: Payer: Self-pay | Admitting: Family Medicine

## 2022-02-14 ENCOUNTER — Ambulatory Visit: Payer: BC Managed Care – PPO | Admitting: Family Medicine

## 2022-02-14 VITALS — BP 124/80 | HR 77 | Temp 99.3°F | Wt 170.0 lb

## 2022-02-14 DIAGNOSIS — I2542 Coronary artery dissection: Secondary | ICD-10-CM

## 2022-02-14 DIAGNOSIS — I214 Non-ST elevation (NSTEMI) myocardial infarction: Secondary | ICD-10-CM

## 2022-02-14 DIAGNOSIS — E782 Mixed hyperlipidemia: Secondary | ICD-10-CM

## 2022-02-14 DIAGNOSIS — I252 Old myocardial infarction: Secondary | ICD-10-CM | POA: Diagnosis not present

## 2022-02-14 DIAGNOSIS — G4700 Insomnia, unspecified: Secondary | ICD-10-CM | POA: Diagnosis not present

## 2022-02-14 MED ORDER — TRAZODONE HCL 50 MG PO TABS: 50.0000 mg | ORAL_TABLET | Freq: Every evening | ORAL | 3 refills | Status: DC | PRN

## 2022-02-14 NOTE — Progress Notes (Signed)
   Subjective:    Patient ID: Tina Juarez, female    DOB: 1965/08/13, 57 y.o.   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 She has an episode of this in 2017 while she was living in [place]. She was feeling fine until the day of admission when she developed a pressure like chest pain. No SOB or sweats or nausea. At the ED an ECHO revealed a dissection of the apical LAD, and this was stabilized with IV heparin and NTG. The ECHO also revealed an EF of 50-55% with apical wall motion abnormality and Grade I diastolic dysfunction. A cardiac cath revealed normal coronary arteries elsewhere. Her creatinine was normal at 0.77 and the LDL was 111. She was sent home on Crestor 20 mg daily, ASA 81 mg daily, and Carvedilol 3.125 mg BID. Since then she has done well with no further chest pains. She is participating in intensive cardiac rehab. She saw Dr. Sherren Mocha on 12-24-21, and she is scheduled to see him again on 04-11-22. She is scheduled for another ECHO on 03-25-22. She was discussing with her rehab therapist last week how she was having trouble sleeping. This has been ongoing since the hospitalization. The therapist suggested she nay be dealing with some depression and anxiety related to her diagnosis, and Tina Juarez agrees this is the case. She says she does not require any medications for this, but she is considering meeting with a psychotherapist. She does ask [place] for some help with sleeping.    Review of Systems  Constitutional: Negative.   Respiratory: Negative.    Cardiovascular: Negative.   Gastrointestinal: Negative.   Psychiatric/Behavioral:  Positive for dysphoric mood and sleep disturbance. Negative for agitation, behavioral problems, confusion, decreased concentration, hallucinations, self-injury and suicidal ideas. The patient is nervous/anxious.        Objective:    Physical Exam Constitutional:      Appearance: Normal appearance.  Cardiovascular:     Rate and Rhythm: Normal rate and regular rhythm.     Pulses: Normal pulses.     Heart sounds: Normal heart sounds.  Pulmonary:     Effort: Pulmonary effort is normal.     Breath sounds: Normal breath sounds.  Neurological:     Mental Status: She is alert.  Psychiatric:        Mood and Affect: Mood normal.        Behavior: Behavior normal.        Thought Content: Thought content normal.           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 For the mild depression and anxiety, she will meet with a therapist sometime soon. For the insomnia, she will try Trazodone 50 mg at bedtime. We spent a total of ( 35  ) minutes reviewing records and discussing these issues.  Alysia Penna, MD

## 2022-02-16 ENCOUNTER — Encounter (HOSPITAL_COMMUNITY)
Admission: RE | Admit: 2022-02-16 | Discharge: 2022-02-16 | Disposition: A | Discharge status: Home / Self Care | Payer: BC Managed Care – PPO | Source: Ambulatory Visit | Attending: Cardiovascular Disease | Admitting: Cardiovascular Disease

## 2022-02-16 DIAGNOSIS — I252 Old myocardial infarction: Secondary | ICD-10-CM | POA: Diagnosis not present

## 2022-02-16 DIAGNOSIS — I214 Non-ST elevation (NSTEMI) myocardial infarction: Secondary | ICD-10-CM

## 2022-02-16 NOTE — Telephone Encounter (Signed)
-----   Message from Conception Chancy, PsyD sent at 02/16/2022  8:17 AM EST ----- Good morning Ms. Gladstone Rosas,  I hope this finds you well. Thank you for the referral and we will get the patient scheduled. Please keep the referrals coming. Have a wonderful day!!  Best, Matt  ----- Message ----- From: Magda Kiel, RN Sent: 02/14/2022   2:48 PM EST To: Conception Chancy, PsyD  Good afternoon Matt   Mrs Cranfield is experiencing some depression post NSTEMI, SCAD.  Would you be able to meet with her in the near future. Mrs Hashimi is interested in counseling and would like to meet with you if you are accepting new patients.  I appreciate your assistance in advance,  Sincerely, Barnet Pall RN Cardiac Rehab

## 2022-02-18 ENCOUNTER — Encounter (HOSPITAL_COMMUNITY)
Admission: RE | Admit: 2022-02-18 | Discharge: 2022-02-18 | Disposition: A | Discharge status: Home / Self Care | Payer: BC Managed Care – PPO | Source: Ambulatory Visit | Attending: Cardiovascular Disease | Admitting: Cardiovascular Disease

## 2022-02-18 DIAGNOSIS — I214 Non-ST elevation (NSTEMI) myocardial infarction: Secondary | ICD-10-CM

## 2022-02-18 DIAGNOSIS — I252 Old myocardial infarction: Secondary | ICD-10-CM | POA: Diagnosis not present

## 2022-02-21 ENCOUNTER — Encounter (HOSPITAL_COMMUNITY)
Admission: RE | Admit: 2022-02-21 | Discharge: 2022-02-21 | Disposition: A | Discharge status: Home / Self Care | Payer: BC Managed Care – PPO | Source: Ambulatory Visit | Attending: Cardiovascular Disease | Admitting: Cardiovascular Disease

## 2022-02-21 ENCOUNTER — Encounter (HOSPITAL_COMMUNITY): Payer: BC Managed Care – PPO

## 2022-02-21 DIAGNOSIS — I252 Old myocardial infarction: Secondary | ICD-10-CM | POA: Diagnosis not present

## 2022-02-21 DIAGNOSIS — I214 Non-ST elevation (NSTEMI) myocardial infarction: Secondary | ICD-10-CM

## 2022-02-23 ENCOUNTER — Encounter (HOSPITAL_COMMUNITY): Payer: BC Managed Care – PPO

## 2022-02-23 ENCOUNTER — Encounter (HOSPITAL_COMMUNITY)
Admission: RE | Admit: 2022-02-23 | Discharge: 2022-02-23 | Disposition: A | Discharge status: Home / Self Care | Payer: BC Managed Care – PPO | Source: Ambulatory Visit | Attending: Cardiovascular Disease | Admitting: Cardiovascular Disease

## 2022-02-23 DIAGNOSIS — I214 Non-ST elevation (NSTEMI) myocardial infarction: Secondary | ICD-10-CM

## 2022-02-23 DIAGNOSIS — I252 Old myocardial infarction: Secondary | ICD-10-CM | POA: Diagnosis not present

## 2022-02-25 ENCOUNTER — Encounter (HOSPITAL_COMMUNITY): Payer: BC Managed Care – PPO

## 2022-02-28 ENCOUNTER — Encounter (HOSPITAL_COMMUNITY): Payer: BC Managed Care – PPO

## 2022-02-28 ENCOUNTER — Encounter (HOSPITAL_COMMUNITY)
Admission: RE | Admit: 2022-02-28 | Discharge: 2022-02-28 | Disposition: A | Discharge status: Home / Self Care | Payer: BC Managed Care – PPO | Source: Ambulatory Visit | Attending: Cardiovascular Disease | Admitting: Cardiovascular Disease

## 2022-02-28 DIAGNOSIS — I252 Old myocardial infarction: Secondary | ICD-10-CM | POA: Diagnosis not present

## 2022-02-28 DIAGNOSIS — I214 Non-ST elevation (NSTEMI) myocardial infarction: Secondary | ICD-10-CM

## 2022-02-28 NOTE — Progress Notes (Signed)
Cardiac Individual Treatment Plan  Patient Details  Name: Tina Juarez MRN: [id number] Date of Birth: February 11, 1965 Referring Provider:   Flowsheet Row INTENSIVE CARDIAC REHAB ORIENT from 02/02/2022 in [facility] for Heart, Vascular, & [place]  Referring Provider Talbot Grumbling, MD       Initial Encounter Date:  [place] from 02/02/2022 in [facility] for Heart, Vascular, & [facility]  Date 02/02/22       Visit Diagnosis: NSTEMI (non-ST elevated myocardial infarction) [facility])  Patient's Home Medications on Admission:  Current Outpatient Medications:    acetaminophen (TYLENOL) 500 MG tablet, Take 1,000 mg by mouth every 6 (six) hours as needed for mild pain., Disp: , Rfl:    aspirin 81 MG tablet, Take 1 tablet (81 mg total) by mouth daily., Disp: 30 tablet, Rfl: 0   carvedilol (COREG) 3.125 MG tablet, Take 1 tablet (3.125 mg total) by mouth 2 (two) times daily with a meal., Disp: 180 tablet, Rfl: 3   melatonin 1 MG TABS tablet, Take 2 mg by mouth at bedtime as needed (sleep)., Disp: , Rfl:    nitroGLYCERIN (NITROSTAT) 0.4 MG SL tablet, Place 1 tablet (0.4 mg total) under the tongue every 5 (five) minutes x 3 doses as needed for chest pain., Disp: 25 tablet, Rfl: 1   rosuvastatin (CRESTOR) 20 MG tablet, Take 1 tablet (20 mg total) by mouth daily., Disp: 90 tablet, Rfl: 3   traZODone (DESYREL) 50 MG tablet, Take 1 tablet (50 mg total) by mouth at bedtime as needed for sleep., Disp: 30 tablet, Rfl: 3  Past Medical History: Past Medical History:  Diagnosis Date   Cataract    removed bilat 03-2017   Myocardial infarction ([place]) 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   was living in [place] at that time (she does not have fibromuscular dysplasia) // Echo 5/22: EF 55-60, no RWMA, GLS -25.3%, normal RVSF, RVSP 20.3, trivial MR     Tobacco Use: Social History   Tobacco Use  Smoking Status Never  Smokeless Tobacco Never    Labs: Review Flowsheet  More data may exist      Latest Ref Rng & Units 03/14/2017 05/13/2019 02/27/2020 10/27/2021 12/20/2021  Labs for ITP Cardiac and Pulmonary Rehab  Cholestrol 0 - 200 mg/dL 173  181  171  176  175   LDL (calc) 0 - 99 mg/dL 96  100  94  102  111   HDL-C >40 mg/dL 64  63  64.00  61.80  58   Trlycerides <150 mg/dL 64  99  63.0  61.0  32   Hemoglobin A1c 4.8 - 5.6 % - - - 5.8  5.7     Capillary Blood Glucose: No results found for: "GLUCAP"   Exercise Target Goals: Exercise Program Goal: Individual exercise prescription set using results from initial 6 min walk test and THRR while considering  patient's activity barriers and safety.   Exercise Prescription Goal: Initial exercise prescription builds to 30-45 minutes a day of aerobic activity, 2-3 days per week.  Home exercise guidelines will be given to patient during program as part of exercise prescription that the participant will acknowledge.  Activity Barriers & Risk Stratification:  Activity Barriers & Cardiac Risk Stratification - 02/02/22 1331       Activity Barriers & Cardiac Risk Stratification   Activity Barriers Joint Problems  Cardiac Risk Stratification High   <5 METS on 6MWT            6 Minute Walk:  6 Minute Walk     Row Name 02/02/22 1328         6 Minute Walk   Phase Initial     Distance 1440 feet     Walk Time 6 minutes     # of Rest Breaks 0     MPH 2.72     METS 3.68     RPE 10     Perceived Dyspnea  0     VO2 Peak 12.98     Symptoms Yes (comment)     Comments felt palpatations with PVCs, dizziness at end of test. no pain.     Resting HR 75 bpm     Resting BP 104/70     Resting Oxygen Saturation  98 %     Exercise Oxygen Saturation  during 6 min walk 99 %     Max Ex. HR 95 bpm     Max Ex. BP 108/76     2 Minute Post BP 92/68  BP recheck with orthostatics. sitting:  94/72. standing: 98/76              Oxygen Initial Assessment:   Oxygen Re-Evaluation:   Oxygen Discharge (Final Oxygen Re-Evaluation):   Initial Exercise Prescription:  Initial Exercise Prescription - 02/02/22 1300       Date of Initial Exercise RX and Referring Provider   Date 02/02/22    Referring Provider Talbot Grumbling, MD    Expected Discharge Date 04/01/22      Arm Ergometer   Level 1.5    RPM 50    Minutes 15    METs 3      Recumbant Elliptical   Level 2    RPM 60    Minutes 15    METs 3      Prescription Details   Frequency (times per week) 3    Duration Progress to 30 minutes of continuous aerobic without signs/symptoms of physical distress      Intensity   THRR 40-80% of Max Heartrate 65-131    Ratings of Perceived Exertion 11-13    Perceived Dyspnea 0-4      Progression   Progression Continue to progress workloads to maintain intensity without signs/symptoms of physical distress.      Resistance Training   Training Prescription Yes    Weight 3    Reps 10-15             Perform Capillary Blood Glucose checks as needed.  Exercise Prescription Changes:   Exercise Prescription Changes     Row Name 02/09/22 1400 02/23/22 1400           Response to Exercise   Blood Pressure (Admit) 100/64 98/62      Blood Pressure (Exercise) 126/80 122/68      Blood Pressure (Exit) 100/64 102/70      Heart Rate (Admit) 69 bpm 72 bpm      Heart Rate (Exercise) 89 bpm 113 bpm      Heart Rate (Exit) 68 bpm 73 bpm      Rating of Perceived Exertion (Exercise) 13 11      Symptoms None None      Comments Pt's first day in the CRP2 program Reviewed METs      Duration Continue with 30 min of aerobic exercise without signs/symptoms of physical distress. Continue with  30 min of aerobic exercise without signs/symptoms of physical distress.      Intensity THRR unchanged THRR unchanged        Progression   Progression Continue to progress workloads to  maintain intensity without signs/symptoms of physical distress. Continue to progress workloads to maintain intensity without signs/symptoms of physical distress.      Average METs 3.4 3.1        Resistance Training   Training Prescription Yes No      Weight No weights on Wednesday No weights on Wednesday      Reps 10-15 --      Time 10 Minutes --        Interval Training   Interval Training No No        Arm Ergometer   Level 1.5 2      Watts 3 3      Minutes 15 15      METs -- 2.5        Recumbant Elliptical   Level 1 2      RPM 60 51      Watts 73 77      Minutes 15 15      METs 3.4 3.7               Exercise Comments:   Exercise Comments     Row Name 02/09/22 1429 02/23/22 1426         Exercise Comments Pt's first day in the CRP2 program. Pt had no complaints and is off to a good start. Reviewed METs. Pt is progressing. Pt wants to advance workloads next session.               Exercise Goals and Review:   Exercise Goals     Row Name 02/02/22 1331             Exercise Goals   Increase Physical Activity Yes       Intervention Provide advice, education, support and counseling about physical activity/exercise needs.;Develop an individualized exercise prescription for aerobic and resistive training based on initial evaluation findings, risk stratification, comorbidities and participant's personal goals.       Expected Outcomes Short Term: Attend rehab on a regular basis to increase amount of physical activity.;Long Term: Exercising regularly at least 3-5 days a week.;Long Term: Add in home exercise to make exercise part of routine and to increase amount of physical activity.       Increase Strength and Stamina Yes       Intervention Provide advice, education, support and counseling about physical activity/exercise needs.;Develop an individualized exercise prescription for aerobic and resistive training based on initial evaluation findings, risk  stratification, comorbidities and participant's personal goals.       Expected Outcomes Short Term: Increase workloads from initial exercise prescription for resistance, speed, and METs.;Short Term: Perform resistance training exercises routinely during rehab and add in resistance training at home;Long Term: Improve cardiorespiratory fitness, muscular endurance and strength as measured by increased METs and functional capacity (6MWT)       Able to understand and use rate of perceived exertion (RPE) scale Yes       Intervention Provide education and explanation on how to use RPE scale       Expected Outcomes Short Term: Able to use RPE daily in rehab to express subjective intensity level;Long Term:  Able to use RPE to guide intensity level when exercising independently       Knowledge and understanding of  Target Heart Rate Range (THRR) Yes       Intervention Provide education and explanation of THRR including how the numbers were predicted and where they are located for reference       Expected Outcomes Short Term: Able to state/look up THRR;Long Term: Able to use THRR to govern intensity when exercising independently;Short Term: Able to use daily as guideline for intensity in rehab       Understanding of Exercise Prescription Yes       Intervention Provide education, explanation, and written materials on patient's individual exercise prescription       Expected Outcomes Short Term: Able to explain program exercise prescription;Long Term: Able to explain home exercise prescription to exercise independently                Exercise Goals Re-Evaluation :  Exercise Goals Re-Evaluation     Row Name 02/09/22 1428             Exercise Goal Re-Evaluation   Exercise Goals Review Increase Physical Activity;Able to understand and use rate of perceived exertion (RPE) scale;Increase Strength and Stamina;Knowledge and understanding of Target Heart Rate Range (THRR);Understanding of Exercise Prescription        Comments Pt's first day in the CRP2 program. Pt understands the exercise Rx, RPE scale and THRR       Expected Outcomes Will continue to monitor patient and progress exercise workloads as tolerated.                Discharge Exercise Prescription (Final Exercise Prescription Changes):  Exercise Prescription Changes - 02/23/22 1400       Response to Exercise   Blood Pressure (Admit) 98/62    Blood Pressure (Exercise) 122/68    Blood Pressure (Exit) 102/70    Heart Rate (Admit) 72 bpm    Heart Rate (Exercise) 113 bpm    Heart Rate (Exit) 73 bpm    Rating of Perceived Exertion (Exercise) 11    Symptoms None    Comments Reviewed METs    Duration Continue with 30 min of aerobic exercise without signs/symptoms of physical distress.    Intensity THRR unchanged      Progression   Progression Continue to progress workloads to maintain intensity without signs/symptoms of physical distress.    Average METs 3.1      Resistance Training   Training Prescription No    Weight No weights on Wednesday      Interval Training   Interval Training No      Arm Ergometer   Level 2    Watts 3    Minutes 15    METs 2.5      Recumbant Elliptical   Level 2    RPM 51    Watts 77    Minutes 15    METs 3.7             Nutrition:  Target Goals: Understanding of nutrition guidelines, daily intake of sodium '1500mg'$ , cholesterol '200mg'$ , calories 30% from fat and 7% or less from saturated fats, daily to have 5 or more servings of fruits and vegetables.  Biometrics:  Pre Biometrics - 02/02/22 1325       Pre Biometrics   Waist Circumference 35.25 inches    Hip Circumference 43 inches    Waist to Hip Ratio 0.82 %    Triceps Skinfold 38 mm    % Body Fat 39 %    Grip Strength 34 kg    Flexibility 18.5 in  Single Leg Stand 30 seconds              Nutrition Therapy Plan and Nutrition Goals:  Nutrition Therapy & Goals - 02/10/22 0907       Nutrition Therapy   Diet  Heart Healthy Diet    Drug/Food Interactions Statins/Certain Fruits      Personal Nutrition Goals   Nutrition Goal Patient to identify strategies for reducing cardiovascular risk by attending the weekly [employer] education and nutrition series    Personal Goal #2 Patient to improve diet quality by using the plate method as a daily guide for meal planning to include lean protein/plant protein, fruits, vegetables, whole grains, nonfat dairy as part of well balanced diet    Personal Goal #3 Patient to identify strategies for weight loss of 0.5-2.0# per week.    Comments Tina Juarez has medical history of spontaneou dissection of coronary artery (2017), NSTEMI 12/2021. She was taking phentermine for weight loss during this time. She is motivated to lose to 160#. Tina Juarez will benefit from participation in intensive cardiac rehab for nutrition, exercise,and lifestyle modifcation.      Intervention Plan   Intervention Prescribe, educate and counsel regarding individualized specific dietary modifications aiming towards targeted core components such as weight, hypertension, lipid management, diabetes, heart failure and other comorbidities.;Nutrition handout(s) given to patient.    Expected Outcomes Short Term Goal: Understand basic principles of dietary content, such as calories, fat, sodium, cholesterol and nutrients.;Long Term Goal: Adherence to prescribed nutrition plan.             Nutrition Assessments:  Nutrition Assessments - 02/10/22 0915       Rate Your Plate Scores   Pre Score 77            MEDIFICTS Score Key: ?70 Need to make dietary changes  40-70 Heart Healthy Diet ? 40 Therapeutic Level Cholesterol Diet   Flowsheet Row INTENSIVE CARDIAC REHAB from 02/09/2022 in [facility] for Heart, Vascular, & Lung Health  Picture Your Plate Total Score on Admission 77      Picture Your Plate Scores: [id number] Unhealthy dietary pattern with much room for improvement. 41-50  Dietary pattern unlikely to meet recommendations for good health and room for improvement. 51-60 More healthful dietary pattern, with some room for improvement.  >60 Healthy dietary pattern, although there may be some specific behaviors that could be improved.    Nutrition Goals Re-Evaluation:  Nutrition Goals Re-Evaluation     [place] Name 02/10/22 0907             Goals   Current Weight 173 lb 4.5 oz (78.6 kg)       Comment A1c 5.7, LDL 111       Expected Outcome Tina Juarez has medical history of spontaneou dissection of coronary artery (2017), NSTEMI 12/2021. She was taking phentermine for weight loss during this time. She has previously seen an RD for nutrition support 11/29/2021. She is motivated to lose to 160#. Tina Juarez will benefit from participation in intensive cardiac rehab for nutrition, exercise,and lifestyle modifcation.                Nutrition Goals Re-Evaluation:  Nutrition Goals Re-Evaluation     [place] Name 02/10/22 0907             Goals   Current Weight 173 lb 4.5 oz (78.6 kg)       Comment A1c 5.7, LDL 111       Expected Outcome Tina Juarez has  medical history of spontaneou dissection of coronary artery (2017), NSTEMI 12/2021. She was taking phentermine for weight loss during this time. She has previously seen an RD for nutrition support 11/29/2021. She is motivated to lose to 160#. Tina Juarez will benefit from participation in intensive cardiac rehab for nutrition, exercise,and lifestyle modifcation.                Nutrition Goals Discharge (Final Nutrition Goals Re-Evaluation):  Nutrition Goals Re-Evaluation - 02/10/22 0907       Goals   Current Weight 173 lb 4.5 oz (78.6 kg)    Comment A1c 5.7, LDL 111    Expected Outcome Tina Juarez has medical history of spontaneou dissection of coronary artery (2017), NSTEMI 12/2021. She was taking phentermine for weight loss during this time. She has previously seen an RD for nutrition support 11/29/2021. She is motivated to lose to 160#.  Tina Juarez will benefit from participation in intensive cardiac rehab for nutrition, exercise,and lifestyle modifcation.             Psychosocial: Target Goals: Acknowledge presence or absence of significant depression and/or stress, maximize coping skills, provide positive support system. Participant is able to verbalize types and ability to use techniques and skills needed for reducing stress and depression.  Initial Review & Psychosocial Screening:  Initial Psych Review & Screening - 02/02/22 1333       Initial Review   Current issues with Current Anxiety/Panic;Current Sleep Concerns;Current Stress Concerns    Source of Stress Concerns Chronic Illness      Family Dynamics   Good Support System? Yes   Tina Juarez has her husband and children for support   Comments Tina Juarez has had some recent trouble falling asleep and staying asleep which she attributes to medication side effects. Tina Juarez also voiced having some anxiety regarding her health and recurring SCAD. When offered resources to help with this anxiety Tina Juarez voiced she used to do counseling for 10 years, she has since stopped but is interested in starting again. Counseling with Kirke Shaggy, PhD was introduced to her and she stated she would be interested in speaking with him for additional help.      Barriers   Psychosocial barriers to participate in program The patient should benefit from training in stress management and relaxation.      Screening Interventions   Interventions Encouraged to exercise;To provide support and resources with identified psychosocial needs;Provide feedback about the scores to participant;Other (comment)    Comments referral to Kirke Shaggy, PhD.    Expected Outcomes Short Term goal: Utilizing psychosocial counselor, staff and physician to assist with identification of specific Stressors or current issues interfering with healing process. Setting desired goal for each stressor or current issue identified.;Long Term  Goal: Stressors or current issues are controlled or eliminated.;Short Term goal: Identification and review with participant of any Quality of Life or Depression concerns found by scoring the questionnaire.;Long Term goal: The participant improves quality of Life and PHQ9 Scores as seen by post scores and/or verbalization of changes             Quality of Life Scores:  Quality of Life - 02/02/22 1339       Quality of Life   Select Quality of Life      Quality of Life Scores   Health/Function Pre 21.1 %    Socioeconomic Pre 26.43 %    Psych/Spiritual Pre 25.75 %    Family Pre 27.3 %    GLOBAL Pre 24.02 %  Scores of 19 and below usually indicate a poorer quality of life in these areas.  A difference of  2-3 points is a clinically meaningful difference.  A difference of 2-3 points in the total score of the Quality of Life Index has been associated with significant improvement in overall quality of life, self-image, physical symptoms, and general health in studies assessing change in quality of life.  PHQ-9: Review Flowsheet  More data exists      02/14/2022 02/02/2022 11/29/2021 10/22/2021 07/30/2021  Depression screen PHQ 2/9  Decreased Interest 0 0 0 0 0  Down, Depressed, Hopeless 0 1 0 0 0  PHQ - 2 Score 0 1 0 0 0  Altered sleeping 2 2 - 2 0  Tired, decreased energy 1 1 - 1 0  Change in appetite 0 1 - 0 0  Feeling bad or failure about yourself  0 1 - 1 0  Trouble concentrating 1 0 - 0 0  Moving slowly or fidgety/restless 0 0 - 0 0  Suicidal thoughts 0 0 - 0 0  PHQ-9 Score 4 6 - 4 0  Difficult doing work/chores Somewhat difficult Somewhat difficult - Not difficult at all Not difficult at all   Interpretation of Total Score  Total Score Depression Severity:  1-4 = Minimal depression, 5-9 = Mild depression, 10-14 = Moderate depression, 15-19 = Moderately severe depression, 20-27 = Severe depression   Psychosocial Evaluation and Intervention:   Psychosocial  Re-Evaluation:  Psychosocial Re-Evaluation     Row Name 02/10/22 0850 02/28/22 1523           Psychosocial Re-Evaluation   Current issues with Current Sleep Concerns;Current Stress Concerns;Current Anxiety/Panic Current Sleep Concerns;Current Stress Concerns;Current Anxiety/Panic      Comments Will review PHQ2-9 in the upcoming week. Tina Juarez did not voice any increased concerns or stressors on her first day of exercise Tina Juarez saw her primary care provider Dr Sarajane Jews on 02/14/22. Tina Juarez is now taking Trazadone and is going to meet with Dessie Coma Psyh D in the near future. Tina Juarez reports that she is feeling better.      Expected Outcomes Tina Juarez will have controlled or decreased stress and anxiety upon completion of intensive cardiac rehab. Tina Juarez will have controlled or decreased stress and anxiety upon completion of intensive cardiac rehab.      Interventions Stress management education;Encouraged to attend Cardiac Rehabilitation for the exercise;Relaxation education Stress management education;Encouraged to attend Cardiac Rehabilitation for the exercise;Relaxation education      Continue Psychosocial Services  Follow up required by staff Follow up required by staff        Initial Review   Source of Stress Concerns Chronic Illness Chronic Illness      Comments Will continue to monitor and offer support as needed Will continue to monitor and offer support as needed               Psychosocial Discharge (Final Psychosocial Re-Evaluation):  Psychosocial Re-Evaluation - 02/28/22 1523       Psychosocial Re-Evaluation   Current issues with Current Sleep Concerns;Current Stress Concerns;Current Anxiety/Panic    Comments Tina Juarez saw her primary care provider Dr Sarajane Jews on 02/14/22. Tina Juarez is now taking Trazadone and is going to meet with Dessie Coma Psyh D in the near future. Tina Juarez reports that she is feeling better.    Expected Outcomes Tina Juarez will have controlled or decreased stress and anxiety upon completion of  intensive cardiac rehab.    Interventions Stress management education;Encouraged to attend Cardiac  Rehabilitation for the exercise;Relaxation education    Continue Psychosocial Services  Follow up required by staff      Initial Review   Source of Stress Concerns Chronic Illness    Comments Will continue to monitor and offer support as needed             Vocational Rehabilitation: Provide vocational rehab assistance to qualifying candidates.   Vocational Rehab Evaluation & Intervention:  Vocational Rehab - 02/02/22 1340       Initial Vocational Rehab Evaluation & Intervention   Assessment shows need for Vocational Rehabilitation No   Tina Juarez is currently working and does not need vocational rehab at this time            Education: Education Goals: Education classes will be provided on a weekly basis, covering required topics. Participant will state understanding/return demonstration of topics presented.    Education     Row Name 02/09/22 1600     Education   Cardiac Education Topics [employer]   Educator Dietitian   Weekly Topic One-Pot Wonders   Instruction Review Code 1- Verbalizes Understanding   Class Start Time 1400   Class Stop Time 1450   Class Time Calculation (min) 50 min    [place] Name 02/14/22 1500     Education   Cardiac Education Topics [employer]   Environmental consultant Psychosocial   Psychosocial Workshop Healthy Sleep for a Healthy Heart   Instruction Review Code 1- Verbalizes Understanding   Class Start Time 1400   Class Stop Time 1450   Class Time Calculation (min) 50 min    [place] Name 02/16/22 1500     Education   Cardiac Education Topics [employer]   Financial trader   Weekly Topic Comforting Weekend Breakfasts   Instruction Review Code 1- Verbalizes Understanding   Class Start Time 1400   Class Stop  Time 1445   Class Time Calculation (min) 45 min    [place] Name 02/18/22 1500     Education   Cardiac Education Topics [employer]   Lexicographer Nutrition   Nutrition Dining Out - Part 1   Instruction Review Code 1- Verbalizes Understanding   Class Start Time 1405   Class Stop Time 1440   Class Time Calculation (min) 35 min    [place] Name 02/21/22 0900     Education   Cardiac Education Topics [employer]in   Select Core Videos     Core Videos   Educator Exercise Physiologist   Select Exercise Education   Exercise Education Biomechanial Limitations   Instruction Review Code 1- Verbalizes Understanding   Class Start Time [phone]   Class Stop Time 0845   Class Time Calculation (min) 35 min    [place] Name 02/28/22 1000     Education   Cardiac Education Topics [employer]in   Financial trader   Weekly Topic International Cuisine- Spotlight on [facility] Zones   Instruction Review Code 1- Verbalizes Understanding   Class Start Time [phone]   Class Stop Time 0850   Class Time Calculation (min) 38 min            Core Videos: Exercise    Move  It!  Clinical staff conducted group or individual video education with verbal and written material and guidebook.  Patient learns the recommended Pritikin exercise program. Exercise with the goal of living a long, healthy life. Some of the health benefits of exercise include controlled diabetes, healthier blood pressure levels, improved cholesterol levels, improved heart and lung capacity, improved sleep, and better body composition. Everyone should speak with their doctor before starting or changing an exercise routine.  Biomechanical Limitations Clinical staff conducted group or individual video education with verbal and written material and guidebook.  Patient learns how biomechanical limitations can impact exercise and how we can mitigate and possibly  overcome limitations to have an impactful and balanced exercise routine.  Body Composition Clinical staff conducted group or individual video education with verbal and written material and guidebook.  Patient learns that body composition (ratio of muscle mass to fat mass) is a key component to assessing overall fitness, rather than body weight alone. Increased fat mass, especially visceral belly fat, can put [place] at increased risk for metabolic syndrome, type 2 diabetes, heart disease, and even death. It is recommended to combine diet and exercise (cardiovascular and resistance training) to improve your body composition. Seek guidance from your physician and exercise physiologist before implementing an exercise routine.  Exercise Action Plan Clinical staff conducted group or individual video education with verbal and written material and guidebook.  Patient learns the recommended strategies to achieve and enjoy long-term exercise adherence, including variety, self-motivation, self-efficacy, and positive decision making. Benefits of exercise include fitness, good health, weight management, more energy, better sleep, less stress, and overall well-being.  Medical   Heart Disease Risk Reduction Clinical staff conducted group or individual video education with verbal and written material and guidebook.  Patient learns our heart is our most vital organ as it circulates oxygen, nutrients, white blood cells, and hormones throughout the entire body, and carries waste away. Data supports a plant-based eating plan like the Pritikin Program for its effectiveness in slowing progression of and reversing heart disease. The video provides a number of recommendations to address heart disease.   Metabolic Syndrome and Belly Fat  Clinical staff conducted group or individual video education with verbal and written material and guidebook.  Patient learns what metabolic syndrome is, how it leads to heart disease, and how  one can reverse it and keep it from coming back. You have metabolic syndrome if you have 3 of the following 5 criteria: abdominal obesity, high blood pressure, high triglycerides, low HDL cholesterol, and high blood sugar.  Hypertension and Heart Disease Clinical staff conducted group or individual video education with verbal and written material and guidebook.  Patient learns that high blood pressure, or hypertension, is very common in the [place]. Hypertension is largely due to excessive salt intake, but other important risk factors include being overweight, physical inactivity, drinking too much alcohol, smoking, and not eating enough potassium from fruits and vegetables. High blood pressure is a leading risk factor for heart attack, stroke, congestive heart failure, dementia, kidney failure, and premature death. Long-term effects of excessive salt intake include stiffening of the arteries and thickening of heart muscle and organ damage. Recommendations include ways to reduce hypertension and the risk of heart disease.  Diseases of Our Time - Focusing on Diabetes Clinical staff conducted group or individual video education with verbal and written material and guidebook.  Patient learns why the best way to stop diseases of our time is prevention, through food and other lifestyle  changes. Medicine (such as prescription pills and surgeries) is often only a Band-Aid on the problem, not a long-term solution. Most common diseases of our time include obesity, type 2 diabetes, hypertension, heart disease, and cancer. The Pritikin Program is recommended and has been proven to help reduce, reverse, and/or prevent the damaging effects of metabolic syndrome.  Nutrition   Overview of the Pritikin Eating Plan  Clinical staff conducted group or individual video education with verbal and written material and guidebook.  Patient learns about the [place] for disease risk reduction. The [place] emphasizes a wide variety of unrefined, minimally-processed carbohydrates, like fruits, vegetables, whole grains, and legumes. Go, Caution, and Stop food choices are explained. Plant-based and lean animal proteins are emphasized. Rationale provided for low sodium intake for blood pressure control, low added sugars for blood sugar stabilization, and low added fats and oils for coronary artery disease risk reduction and weight management.  Calorie Density  Clinical staff conducted group or individual video education with verbal and written material and guidebook.  Patient learns about calorie density and how it impacts the Pritikin Eating Plan. Knowing the characteristics of the food you choose will help you decide whether those foods will lead to weight gain or weight loss, and whether you want to consume more or less of them. Weight loss is usually a side effect of the Pritikin Eating Plan because of its focus on low calorie-dense foods.  Label Reading  Clinical staff conducted group or individual video education with verbal and written material and guidebook.  Patient learns about the Pritikin recommended label reading guidelines and corresponding recommendations regarding calorie density, added sugars, sodium content, and whole grains.  Dining Out - Part 1  Clinical staff conducted group or individual video education with verbal and written material and guidebook.  Patient learns that restaurant meals can be sabotaging because they can be so high in calories, fat, sodium, and/or sugar. Patient learns recommended strategies on how to positively address this and avoid unhealthy pitfalls.  Facts on Fats  Clinical staff conducted group or individual video education with verbal and written material and guidebook.  Patient learns that lifestyle modifications can be just as effective, if not more so, as many medications for lowering your risk of heart disease. A Pritikin lifestyle can help to  reduce your risk of inflammation and atherosclerosis (cholesterol build-up, or plaque, in the artery walls). Lifestyle interventions such as dietary choices and physical activity address the cause of atherosclerosis. A review of the types of fats and their impact on blood cholesterol levels, along with dietary recommendations to reduce fat intake is also included.  Nutrition Action Plan  Clinical staff conducted group or individual video education with verbal and written material and guidebook.  Patient learns how to incorporate Pritikin recommendations into their lifestyle. Recommendations include planning and keeping personal health goals in mind as an important part of their success.  Healthy Mind-Set    Healthy Minds, Bodies, Hearts  Clinical staff conducted group or individual video education with verbal and written material and guidebook.  Patient learns how to identify when they are stressed. Video will discuss the impact of that stress, as well as the many benefits of stress management. Patient will also be introduced to stress management techniques. The way we think, act, and feel has an impact on our hearts.  How Our Thoughts Can Heal Our Hearts  Clinical staff conducted group or individual video education with verbal and written material and  guidebook.  Patient learns that negative thoughts can cause depression and anxiety. This can result in negative lifestyle behavior and serious health problems. Cognitive behavioral therapy is an effective method to help control our thoughts in order to change and improve our emotional outlook.  Additional Videos:  Exercise    Improving Performance  Clinical staff conducted group or individual video education with verbal and written material and guidebook.  Patient learns to use a non-linear approach by alternating intensity levels and lengths of time spent exercising to help burn more calories and lose more body fat. Cardiovascular exercise helps  improve heart health, metabolism, hormonal balance, blood sugar control, and recovery from fatigue. Resistance training improves strength, endurance, balance, coordination, reaction time, metabolism, and muscle mass. Flexibility exercise improves circulation, posture, and balance. Seek guidance from your physician and exercise physiologist before implementing an exercise routine and learn your capabilities and proper form for all exercise.  Introduction to Yoga  Clinical staff conducted group or individual video education with verbal and written material and guidebook.  Patient learns about yoga, a discipline of the coming together of mind, breath, and body. The benefits of yoga include improved flexibility, improved range of motion, better posture and core strength, increased lung function, weight loss, and positive self-image. Yoga's heart health benefits include lowered blood pressure, healthier heart rate, decreased cholesterol and triglyceride levels, improved immune function, and reduced stress. Seek guidance from your physician and exercise physiologist before implementing an exercise routine and learn your capabilities and proper form for all exercise.  Medical   Aging: Enhancing Your Quality of Life  Clinical staff conducted group or individual video education with verbal and written material and guidebook.  Patient learns key strategies and recommendations to stay in good physical health and enhance quality of life, such as prevention strategies, having an advocate, securing a [place], and keeping a list of medications and system for tracking them. It also discusses how to avoid risk for bone loss.  Biology of Weight Control  Clinical staff conducted group or individual video education with verbal and written material and guidebook.  Patient learns that weight gain occurs because we consume more calories than we burn (eating more, moving less). Even if your body  weight is normal, you may have higher ratios of fat compared to muscle mass. Too much body fat puts you at increased risk for cardiovascular disease, heart attack, stroke, type 2 diabetes, and obesity-related cancers. In addition to exercise, following the [place] can help reduce your risk.  Decoding Lab Results  Clinical staff conducted group or individual video education with verbal and written material and guidebook.  Patient learns that lab test reflects one measurement whose values change over time and are influenced by many factors, including medication, stress, sleep, exercise, food, hydration, pre-existing medical conditions, and more. It is recommended to use the knowledge from this video to become more involved with your lab results and evaluate your numbers to speak with your doctor.   Diseases of Our Time - Overview  Clinical staff conducted group or individual video education with verbal and written material and guidebook.  Patient learns that according to the CDC, 50% to 70% of chronic diseases (such as obesity, type 2 diabetes, elevated lipids, hypertension, and heart disease) are avoidable through lifestyle improvements including healthier food choices, listening to satiety cues, and increased physical activity.  Sleep Disorders Clinical staff conducted group or individual video education with verbal and written material  and guidebook.  Patient learns how good quality and duration of sleep are important to overall health and well-being. Patient also learns about sleep disorders and how they impact health along with recommendations to address them, including discussing with a physician.  Nutrition  Dining Out - Part 2 Clinical staff conducted group or individual video education with verbal and written material and guidebook.  Patient learns how to plan ahead and communicate in order to maximize their dining experience in a healthy and nutritious manner. Included are  recommended food choices based on the type of restaurant the patient is visiting.   Fueling a Best boy conducted group or individual video education with verbal and written material and guidebook.  There is a strong connection between our food choices and our health. Diseases like obesity and type 2 diabetes are very prevalent and are in large-part due to lifestyle choices. The Pritikin Eating Plan provides plenty of food and hunger-curbing satisfaction. It is easy to follow, affordable, and helps reduce health risks.  Menu Workshop  Clinical staff conducted group or individual video education with verbal and written material and guidebook.  Patient learns that restaurant meals can sabotage health goals because they are often packed with calories, fat, sodium, and sugar. Recommendations include strategies to plan ahead and to communicate with the manager, chef, or server to help order a healthier meal.  Planning Your Eating Strategy  Clinical staff conducted group or individual video education with verbal and written material and guidebook.  Patient learns about the [place] and its benefit of reducing the risk of disease. The [place] does not focus on calories. Instead, it emphasizes high-quality, nutrient-rich foods. By knowing the characteristics of the foods, we choose, we can determine their calorie density and make informed decisions.  Targeting Your Nutrition Priorities  Clinical staff conducted group or individual video education with verbal and written material and guidebook.  Patient learns that lifestyle habits have a tremendous impact on disease risk and progression. This video provides eating and physical activity recommendations based on your personal health goals, such as reducing LDL cholesterol, losing weight, preventing or controlling type 2 diabetes, and reducing high blood pressure.  Vitamins and Minerals  Clinical staff conducted  group or individual video education with verbal and written material and guidebook.  Patient learns different ways to obtain key vitamins and minerals, including through a recommended healthy diet. It is important to discuss all supplements you take with your doctor.   Healthy Mind-Set    Smoking Cessation  Clinical staff conducted group or individual video education with verbal and written material and guidebook.  Patient learns that cigarette smoking and tobacco addiction pose a serious health risk which affects millions of people. Stopping smoking will significantly reduce the risk of heart disease, lung disease, and many forms of cancer. Recommended strategies for quitting are covered, including working with your doctor to develop a successful plan.  Culinary   Becoming a Financial trader conducted group or individual video education with verbal and written material and guidebook.  Patient learns that cooking at home can be healthy, cost-effective, quick, and puts them in control. Keys to cooking healthy recipes will include looking at your recipe, assessing your equipment needs, planning ahead, making it simple, choosing cost-effective seasonal ingredients, and limiting the use of added fats, salts, and sugars.  Cooking - Breakfast and Snacks  Clinical staff conducted group or individual video education with verbal and written material  and guidebook.  Patient learns how important breakfast is to satiety and nutrition through the entire day. Recommendations include key foods to eat during breakfast to help stabilize blood sugar levels and to prevent overeating at meals later in the day. Planning ahead is also a key component.  Cooking - Human resources officer conducted group or individual video education with verbal and written material and guidebook.  Patient learns eating strategies to improve overall health, including an approach to cook more at home. Recommendations  include thinking of animal protein as a side on your plate rather than center stage and focusing instead on lower calorie dense options like vegetables, fruits, whole grains, and plant-based proteins, such as beans. Making sauces in large quantities to freeze for later and leaving the skin on your vegetables are also recommended to maximize your experience.  Cooking - Healthy Salads and Dressing Clinical staff conducted group or individual video education with verbal and written material and guidebook.  Patient learns that vegetables, fruits, whole grains, and legumes are the foundations of the [place]. Recommendations include how to incorporate each of these in flavorful and healthy salads, and how to create homemade salad dressings. Proper handling of ingredients is also covered. Cooking - Soups and [employer] - Soups and Desserts Clinical staff conducted group or individual video education with verbal and written material and guidebook.  Patient learns that Pritikin soups and desserts make for easy, nutritious, and delicious snacks and meal components that are low in sodium, fat, sugar, and calorie density, while high in vitamins, minerals, and filling fiber. Recommendations include simple and healthy ideas for soups and desserts.   Overview     The Pritikin Solution Program Overview Clinical staff conducted group or individual video education with verbal and written material and guidebook.  Patient learns that the results of the [place] Program have been documented in more than 100 articles published in peer-reviewed journals, and the benefits include reducing risk factors for (and, in some cases, even reversing) high cholesterol, high blood pressure, type 2 diabetes, obesity, and more! An overview of the three key pillars of the Pritikin Program will be covered: eating well, doing regular exercise, and having a healthy mind-set.  WORKSHOPS  Exercise: Exercise Basics:  Building Your Action Plan Clinical staff led group instruction and group discussion with PowerPoint presentation and patient guidebook. To enhance the learning environment the use of posters, models and videos may be added. At the conclusion of this workshop, patients will comprehend the difference between physical activity and exercise, as well as the benefits of incorporating both, into their routine. Patients will understand the FITT (Frequency, Intensity, Time, and Type) principle and how to use it to build an exercise action plan. In addition, safety concerns and other considerations for exercise and cardiac rehab will be addressed by the presenter. The purpose of this lesson is to promote a comprehensive and effective weekly exercise routine in order to improve patients' overall level of fitness.   Managing Heart Disease: Your Path to a Healthier Heart Clinical staff led group instruction and group discussion with PowerPoint presentation and patient guidebook. To enhance the learning environment the use of posters, models and videos may be added.At the conclusion of this workshop, patients will understand the anatomy and physiology of the heart. Additionally, they will understand how Pritikin's three pillars impact the risk factors, the progression, and the management of heart disease.  The purpose of this lesson is to provide a high-level  overview of the heart, heart disease, and how the Pritikin lifestyle positively impacts risk factors.  Exercise Biomechanics Clinical staff led group instruction and group discussion with PowerPoint presentation and patient guidebook. To enhance the learning environment the use of posters, models and videos may be added. Patients will learn how the structural parts of their bodies function and how these functions impact their daily activities, movement, and exercise. Patients will learn how to promote a neutral spine, learn how to manage pain, and identify  ways to improve their physical movement in order to promote healthy living. The purpose of this lesson is to expose patients to common physical limitations that impact physical activity. Participants will learn practical ways to adapt and manage aches and pains, and to minimize their effect on regular exercise. Patients will learn how to maintain good posture while sitting, walking, and lifting.  Balance Training and Fall Prevention  Clinical staff led group instruction and group discussion with PowerPoint presentation and patient guidebook. To enhance the learning environment the use of posters, models and videos may be added. At the conclusion of this workshop, patients will understand the importance of their sensorimotor skills (vision, proprioception, and the vestibular system) in maintaining their ability to balance as they age. Patients will apply a variety of balancing exercises that are appropriate for their current level of function. Patients will understand the common causes for poor balance, possible solutions to these problems, and ways to modify their physical environment in order to minimize their fall risk. The purpose of this lesson is to teach patients about the importance of maintaining balance as they age and ways to minimize their risk of falling.  WORKSHOPS   Nutrition:  Fueling a Scientist, research (physical sciences) led group instruction and group discussion with PowerPoint presentation and patient guidebook. To enhance the learning environment the use of posters, models and videos may be added. Patients will review the foundational principles of the [place] and understand what constitutes a serving size in each of the food groups. Patients will also learn Pritikin-friendly foods that are better choices when away from home and review make-ahead meal and snack options. Calorie density will be reviewed and applied to three nutrition priorities: weight maintenance, weight  loss, and weight gain. The purpose of this lesson is to reinforce (in a group setting) the key concepts around what patients are recommended to eat and how to apply these guidelines when away from home by planning and selecting Pritikin-friendly options. Patients will understand how calorie density may be adjusted for different weight management goals.  Mindful Eating  Clinical staff led group instruction and group discussion with PowerPoint presentation and patient guidebook. To enhance the learning environment the use of posters, models and videos may be added. Patients will briefly review the concepts of the [place] and the importance of low-calorie dense foods. The concept of mindful eating will be introduced as well as the importance of paying attention to internal hunger signals. Triggers for non-hunger eating and techniques for dealing with triggers will be explored. The purpose of this lesson is to provide patients with the opportunity to review the basic principles of the [place], discuss the value of eating mindfully and how to measure internal cues of hunger and fullness using the Hunger Scale. Patients will also discuss reasons for non-hunger eating and learn strategies to use for controlling emotional eating.  Targeting Your Nutrition Priorities Clinical staff led group instruction and group discussion with PowerPoint presentation and  patient guidebook. To enhance the learning environment the use of posters, models and videos may be added. Patients will learn how to determine their genetic susceptibility to disease by reviewing their family history. Patients will gain insight into the importance of diet as part of an overall healthy lifestyle in mitigating the impact of genetics and other environmental insults. The purpose of this lesson is to provide patients with the opportunity to assess their personal nutrition priorities by looking at their family history, their own  health history and current risk factors. Patients will also be able to discuss ways of prioritizing and modifying the [place] for their highest risk areas  Menu  Clinical staff led group instruction and group discussion with PowerPoint presentation and patient guidebook. To enhance the learning environment the use of posters, models and videos may be added. Using menus brought in from [employer], or printed from [employer], patients will apply the [place] dining out guidelines that were presented in the [employer] video. Patients will also be able to practice these guidelines in a variety of provided scenarios. The purpose of this lesson is to provide patients with the opportunity to practice hands-on learning of the [place] with actual menus and practice scenarios.  Label Reading Clinical staff led group instruction and group discussion with PowerPoint presentation and patient guidebook. To enhance the learning environment the use of posters, models and videos may be added. Patients will review and discuss the [employer] label reading guidelines presented in [employer]'s Label Reading Educational series video. Using fool labels brought in from local grocery stores and markets, patients will apply the label reading guidelines and determine if the packaged food meet the [employer] guidelines. The purpose of this lesson is to provide patients with the opportunity to review, discuss, and practice hands-on learning of the [employer] Label Reading guidelines with actual packaged food labels. [employer] Workshops are designed to teach patients ways to prepare quick, simple, and affordable recipes at home. The importance of nutrition's role in chronic disease risk reduction is reflected in its emphasis in the overall [employer] program. By learning how to prepare essential core [employer] Eating Plan recipes, patients will  increase control over what they eat; be able to customize the flavor of foods without the use of added salt, sugar, or fat; and improve the quality of the food they consume. By learning a set of core recipes which are easily assembled, quickly prepared, and affordable, patients are more likely to prepare more healthy foods at home. These workshops focus on convenient breakfasts, simple entres, side dishes, and desserts which can be prepared with minimal effort and are consistent with nutrition recommendations for cardiovascular risk reduction. Cooking [employer] are taught by a Engineer, materials (RD) who has been trained by the [employer]. The chef or RD has a clear understanding of the importance of minimizing - if not completely eliminating - added fat, sugar, and sodium in recipes. Throughout the series of [place] Workshop sessions, patients will learn about healthy ingredients and efficient methods of cooking to build confidence in their capability to prepare    Cooking School weekly topics:  Adding Flavor- Sodium-Free  Fast and Healthy Breakfasts  Powerhouse Plant-Based Proteins  Satisfying Salads and Dressings  Simple Sides and Sauces  International Cuisine-Spotlight on the [place] Zones  Delicious Desserts  Savory Soups  [employer] - Meals in a Snap  Tasty Appetizers and Snacks  Comforting Weekend Breakfasts  One-Pot Wonders   Fast [employer] Your [employer] Plate  WORKSHOPS   Healthy Mindset (Psychosocial): New Thoughts, New Behaviors Clinical staff led group instruction and group discussion with PowerPoint presentation and patient guidebook. To enhance the learning environment the use of posters, models and videos may be added. Patients will learn and practice techniques for developing effective health and lifestyle goals. Patients will be able to effectively apply the goal setting process learned to  develop at least one new personal goal.  The purpose of this lesson is to expose patients to a new skill set of behavior modification techniques such as techniques setting SMART goals, overcoming barriers, and achieving new thoughts and new behaviors.  Managing Moods and Relationships Clinical staff led group instruction and group discussion with PowerPoint presentation and patient guidebook. To enhance the learning environment the use of posters, models and videos may be added. Patients will learn how emotional and chronic stress factors can impact their health and relationships. They will learn healthy ways to manage their moods and utilize positive coping mechanisms. In addition, ICR patients will learn ways to improve communication skills. The purpose of this lesson is to expose patients to ways of understanding how one's mood and health are intimately connected. Developing a healthy outlook can help build positive relationships and connections with others. Patients will understand the importance of utilizing effective communication skills that include actively listening and being heard. They will learn and understand the importance of the "4 Cs" and especially Connections in fostering of a Healthy Mind-Set.  Healthy Sleep for a Healthy Heart Clinical staff led group instruction and group discussion with PowerPoint presentation and patient guidebook. To enhance the learning environment the use of posters, models and videos may be added. At the conclusion of this workshop, patients will be able to demonstrate knowledge of the importance of sleep to overall health, well-being, and quality of life. They will understand the symptoms of, and treatments for, common sleep disorders. Patients will also be able to identify daytime and nighttime behaviors which impact sleep, and they will be able to apply these tools to help manage sleep-related challenges. The purpose of this lesson is to provide patients with a  general overview of sleep and outline the importance of quality sleep. Patients will learn about a few of the most common sleep disorders. Patients will also be introduced to the concept of "sleep hygiene," and discover ways to self-manage certain sleeping problems through simple daily behavior changes. Finally, the workshop will motivate patients by clarifying the links between quality sleep and their goals of heart-healthy living.   Recognizing and Reducing Stress Clinical staff led group instruction and group discussion with PowerPoint presentation and patient guidebook. To enhance the learning environment the use of posters, models and videos may be added. At the conclusion of this workshop, patients will be able to understand the types of stress reactions, differentiate between acute and chronic stress, and recognize the impact that chronic stress has on their health. They will also be able to apply different coping mechanisms, such as reframing negative self-talk. Patients will have the opportunity to practice a variety of stress management techniques, such as deep abdominal breathing, progressive muscle relaxation, and/or guided imagery.  The purpose of this lesson is to educate patients on the role of stress in their lives and to provide healthy techniques for coping with it.  Learning Barriers/Preferences:  Learning Barriers/Preferences - 02/02/22 1339  Learning Barriers/Preferences   Learning Barriers None    Learning Preferences Audio;Group Instruction;Computer/Internet;Individual Instruction;Pictoral;Skilled Demonstration;Video;Written Material;Verbal Instruction             Education Topics:  Knowledge Questionnaire Score:  Knowledge Questionnaire Score - 02/02/22 1340       Knowledge Questionnaire Score   Pre Score 23/24             Core Components/Risk Factors/Patient Goals at Admission:  Personal Goals and Risk Factors at Admission - 02/02/22 1340       Core  Components/Risk Factors/Patient Goals on Admission    Weight Management Yes;Weight Loss    Intervention Weight Management: Develop a combined nutrition and exercise program designed to reach desired caloric intake, while maintaining appropriate intake of nutrient and fiber, sodium and fats, and appropriate energy expenditure required for the weight goal.;Weight Management: Provide education and appropriate resources to help participant work on and attain dietary goals.    Goal Weight: Short Term 160 lb (72.6 kg)    Expected Outcomes Short Term: Continue to assess and modify interventions until short term weight is achieved;Long Term: Adherence to nutrition and physical activity/exercise program aimed toward attainment of established weight goal;Weight Loss: Understanding of general recommendations for a balanced deficit meal plan, which promotes 1-2 lb weight loss per week and includes a negative energy balance of [phone] kcal/d;Understanding recommendations for meals to include 15-35% energy as protein, 25-35% energy from fat, 35-60% energy from carbohydrates, less than '200mg'$  of dietary cholesterol, 20-35 gm of total fiber daily;Understanding of distribution of calorie intake throughout the day with the consumption of 4-5 meals/snacks    Hypertension Yes    Intervention Monitor prescription use compliance.;Provide education on lifestyle modifcations including regular physical activity/exercise, weight management, moderate sodium restriction and increased consumption of fresh fruit, vegetables, and low fat dairy, alcohol moderation, and smoking cessation.    Expected Outcomes Long Term: Maintenance of blood pressure at goal levels.;Short Term: Continued assessment and intervention until BP is < 140/89m HG in hypertensive participants. < 130/820mHG in hypertensive participants with diabetes, heart failure or chronic kidney disease.    Lipids Yes    Intervention Provide education and support for  participant on nutrition & aerobic/resistive exercise along with prescribed medications to achieve LDL '70mg'$ , HDL >'40mg'$ .    Expected Outcomes Long Term: Cholesterol controlled with medications as prescribed, with individualized exercise RX and with personalized nutrition plan. Value goals: LDL < '70mg'$ , HDL > 40 mg.;Short Term: Participant states understanding of desired cholesterol values and is compliant with medications prescribed. Participant is following exercise prescription and nutrition guidelines.    Stress Yes    Intervention Offer individual and/or small group education and counseling on adjustment to heart disease, stress management and health-related lifestyle change. Teach and support self-help strategies.;Refer participants experiencing significant psychosocial distress to appropriate mental health specialists for further evaluation and treatment. When possible, include family members and significant others in education/counseling sessions.    Expected Outcomes Short Term: Participant demonstrates changes in health-related behavior, relaxation and other stress management skills, ability to obtain effective social support, and compliance with psychotropic medications if prescribed.;Long Term: Emotional wellbeing is indicated by absence of clinically significant psychosocial distress or social isolation.             Core Components/Risk Factors/Patient Goals Review:   Goals and Risk Factor Review     Row Name 02/10/22 0946 02/28/22 1526           Core Components/Risk Factors/Patient Goals Review  Personal Goals Review Weight Management/Obesity;Hypertension;Lipids Weight Management/Obesity;Hypertension;Lipids      Review Tina Juarez started intensive cardiac rehab on 02/09/22 and did well with exercise, vital signs were stable Tina Juarez started intensive cardiac rehab on 02/09/22 and did well with exercise, vital signs were stable. Tina Juarez did report feeling lightheaded today after exercise.  orthostatic BP's checked, WNL. Encouraged to hydrate adequately prior to coming to exercise at cardiac rehab      Expected Outcomes Tina Juarez will continue to participate in intensive cardiac rehab for exercise, nutrition and lifestyle modifications Tina Juarez will continue to participate in intensive cardiac rehab for exercise, nutrition and lifestyle modifications               Core Components/Risk Factors/Patient Goals at Discharge (Final Review):   Goals and Risk Factor Review - 02/28/22 1526       Core Components/Risk Factors/Patient Goals Review   Personal Goals Review Weight Management/Obesity;Hypertension;Lipids    Review Tina Juarez started intensive cardiac rehab on 02/09/22 and did well with exercise, vital signs were stable. Tina Juarez did report feeling lightheaded today after exercise. orthostatic BP's checked, WNL. Encouraged to hydrate adequately prior to coming to exercise at cardiac rehab    Expected Outcomes Tina Juarez will continue to participate in intensive cardiac rehab for exercise, nutrition and lifestyle modifications             ITP Comments:  ITP Comments     Row Name 02/02/22 0845 02/10/22 0843 02/28/22 1522       ITP Comments Dr. Fransico Him medical director. Intorduction to [place] education program/ intensive cardiac rehab. Initial orientation packet reviewed with patient. 30 Day ITP Review. Tina Juarez started intensive cardiac rehab on 02/09/22 and did well with exercise. 30 Day ITP Review. Tina Juarez has good attendance and participation in  intensive cardiac rehab. Tina Juarez has swtiched to the 0800 AM class to better accomodate her schedule              Comments: See ITP comments.Harrell Gave RN BSN

## 2022-03-02 ENCOUNTER — Encounter (HOSPITAL_COMMUNITY): Payer: BC Managed Care – PPO

## 2022-03-02 ENCOUNTER — Encounter (HOSPITAL_COMMUNITY)
Admission: RE | Admit: 2022-03-02 | Discharge: 2022-03-02 | Disposition: A | Discharge status: Home / Self Care | Payer: BC Managed Care – PPO | Source: Ambulatory Visit | Attending: Cardiovascular Disease | Admitting: Cardiovascular Disease

## 2022-03-02 DIAGNOSIS — I252 Old myocardial infarction: Secondary | ICD-10-CM | POA: Diagnosis not present

## 2022-03-02 DIAGNOSIS — I214 Non-ST elevation (NSTEMI) myocardial infarction: Secondary | ICD-10-CM

## 2022-03-03 ENCOUNTER — Ambulatory Visit (HOSPITAL_COMMUNITY): Payer: BC Managed Care – PPO

## 2022-03-04 ENCOUNTER — Encounter (HOSPITAL_COMMUNITY): Payer: BC Managed Care – PPO

## 2022-03-07 ENCOUNTER — Encounter (HOSPITAL_COMMUNITY): Payer: BC Managed Care – PPO

## 2022-03-07 ENCOUNTER — Encounter (HOSPITAL_COMMUNITY)
Admission: RE | Admit: 2022-03-07 | Discharge: 2022-03-07 | Disposition: A | Discharge status: Home / Self Care | Payer: BC Managed Care – PPO | Source: Ambulatory Visit | Attending: Cardiovascular Disease | Admitting: Cardiovascular Disease

## 2022-03-07 DIAGNOSIS — I214 Non-ST elevation (NSTEMI) myocardial infarction: Secondary | ICD-10-CM | POA: Diagnosis present

## 2022-03-07 DIAGNOSIS — I252 Old myocardial infarction: Secondary | ICD-10-CM | POA: Diagnosis not present

## 2022-03-07 DIAGNOSIS — Z48812 Encounter for surgical aftercare following surgery on the circulatory system: Secondary | ICD-10-CM | POA: Insufficient documentation

## 2022-03-09 ENCOUNTER — Encounter (HOSPITAL_COMMUNITY): Payer: BC Managed Care – PPO

## 2022-03-11 ENCOUNTER — Encounter (HOSPITAL_COMMUNITY): Payer: BC Managed Care – PPO

## 2022-03-14 ENCOUNTER — Encounter (HOSPITAL_COMMUNITY)
Admission: RE | Admit: 2022-03-14 | Discharge: 2022-03-14 | Disposition: A | Discharge status: Home / Self Care | Payer: BC Managed Care – PPO | Source: Ambulatory Visit | Attending: Cardiovascular Disease | Admitting: Cardiovascular Disease

## 2022-03-14 ENCOUNTER — Encounter (HOSPITAL_COMMUNITY): Payer: BC Managed Care – PPO

## 2022-03-14 DIAGNOSIS — I214 Non-ST elevation (NSTEMI) myocardial infarction: Secondary | ICD-10-CM

## 2022-03-14 DIAGNOSIS — I252 Old myocardial infarction: Secondary | ICD-10-CM | POA: Diagnosis not present

## 2022-03-16 ENCOUNTER — Encounter (HOSPITAL_COMMUNITY): Payer: BC Managed Care – PPO

## 2022-03-16 ENCOUNTER — Encounter (HOSPITAL_COMMUNITY)
Admission: RE | Admit: 2022-03-16 | Discharge: 2022-03-16 | Disposition: A | Discharge status: Home / Self Care | Payer: BC Managed Care – PPO | Source: Ambulatory Visit | Attending: Cardiovascular Disease | Admitting: Cardiovascular Disease

## 2022-03-16 DIAGNOSIS — I214 Non-ST elevation (NSTEMI) myocardial infarction: Secondary | ICD-10-CM

## 2022-03-16 DIAGNOSIS — I252 Old myocardial infarction: Secondary | ICD-10-CM | POA: Diagnosis not present

## 2022-03-16 NOTE — Progress Notes (Signed)
Reviewed home exercise Rx with patient today.  Encouraged warm-up, cool-down, and stretching. Reviewed THRR of  65 - 131 and keeping RPE between 11-13. Encouraged to hydrate with activity.  Reviewed weather parameters for temperature and humidity for safe exercise outdoors. Reviewed S/S to terminate exercise and when to call 911 vs MD. Reviewed the use of NTG and pt was encouraged to carry at all times. Pt encouraged to always carry a cell phone for safety when exercising outdoors. Pt verbalized understanding of the home exercise Rx and was provided a copy.   Lesly Rubenstein MS, ACSM-CEP, CCRP

## 2022-03-17 ENCOUNTER — Ambulatory Visit (INDEPENDENT_AMBULATORY_CARE_PROVIDER_SITE_OTHER): Payer: BC Managed Care – PPO | Admitting: Behavioral Health

## 2022-03-17 DIAGNOSIS — F418 Other specified anxiety disorders: Secondary | ICD-10-CM | POA: Diagnosis not present

## 2022-03-17 DIAGNOSIS — I214 Non-ST elevation (NSTEMI) myocardial infarction: Secondary | ICD-10-CM

## 2022-03-17 NOTE — Progress Notes (Signed)
[facility] Counselor Initial Adult Exam  Name: Tina Juarez Date: 03/17/2022 MRN: [id number] DOB: 12-12-1965 PCP: Tina Morale, MD  Time spent: 60 min Caregility video; Pt is home in private & Provider working remotely from [place]:  Tina Juarez requested: No   Reason for Visit /Presenting Problem: Elevated anx/dep & stress due to health status changes & family dynamics  Mental Status Exam: Appearance:   Casual     Behavior:  Appropriate and Sharing  Motor:  Normal  Speech/Language:   Clear and Coherent and Normal Rate  Affect:  Appropriate  Mood:  normal  Thought process:  normal  Thought content:    WNL  Sensory/Perceptual disturbances:    WNL  Orientation:  oriented to person, place, and time/date  Attention:  Good  Concentration:  Good  Memory:  WNL  Fund of knowledge:   Good  Insight:    Good  Judgment:   Good  Impulse Control:  Good    Risk Assessment: Danger to Self:  No Self-injurious Behavior: No Danger to Others: No Duty to Warn:no Physical Aggression / Violence:No  Access to Firearms a concern: No  Gang Involvement:No  Patient / guardian was educated about steps to take if suicide or homicide risk level increases between visits: yes While future psychiatric events cannot be accurately predicted, the patient does not currently require acute inpatient psychiatric care and does not currently meet [facility] involuntary commitment criteria.  Substance Abuse History: Current substance abuse: No     Past Psychiatric History:   No previous psychological problems have been observed Outpatient Providers: Dr. Alysia Penna, MD History of Psych Hospitalization: No  Psychological Testing:  NA    Abuse History:  Victim of: No.,  NA    Report needed: No. Victim of Neglect:No. Perpetrator of  NA   Witness / Exposure to Domestic Violence: No   Protective Services Involvement: No  Witness to [employer]  Violence:  No   Family History:  Family History  Problem Relation Age of Onset   Mental retardation Mother    Cancer Father    Hyperlipidemia Father    Prostate cancer Father    Breast cancer Paternal Grandmother        57s   Colon polyps Neg Hx    Colon cancer Neg Hx    Esophageal cancer Neg Hx    Rectal cancer Neg Hx    Stomach cancer Neg Hx     Living situation: the patient lives with their family  Sexual Orientation: Straight  Relationship Status: married  Name of spouse: Tina Juarez; married 13 yrs, known  If a parent, number of children / ages: 17yo Dtr Tina Juarez: spouse friends parents Geophysicist/field seismologist Stress:  No   Income/Employment/Disability: Employment 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[employer], broadcasting/film/video in [place], [place] Service: No   Educational History: Education: post Forensic psychologist work or degree from [employer] in [place]  Religion/Sprituality/World View: [employer] [place] in [place]  Any cultural differences that may affect / interfere with treatment:  None noted today  Recreation/Hobbies: likes walking the dog daily, hiking, 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 Carlean Purl, benign polyps, repeat in 10 yrs    LEFT HEART CATH AND CORONARY ANGIOGRAPHY N/A 12/20/2021   Procedure: LEFT HEART CATH AND CORONARY ANGIOGRAPHY;  Surgeon: Nelva Bush, MD;  Location: [facility];  Service: Cardiovascular;  Laterality: N/A;    Medications: Current Outpatient Medications  Medication Sig Dispense Refill   acetaminophen (TYLENOL) 500 MG tablet Take 1,000 mg by mouth every 6 (six) hours as needed for mild pain.     aspirin 81 MG tablet Take 1 tablet (81 mg total) by mouth daily. 30 tablet 0   carvedilol (COREG) 3.125 MG tablet Take 1 tablet (3.125 mg total) by mouth 2 (two) times daily with a meal. 180 tablet 3   melatonin 1 MG TABS tablet Take 2 mg by mouth at bedtime as needed (sleep).     nitroGLYCERIN (NITROSTAT) 0.4 MG SL tablet Place 1 tablet (0.4 mg total) under the tongue every 5 (five) minutes x 3 doses as needed for chest pain. 25 tablet 1   rosuvastatin (CRESTOR) 20 MG tablet Take 1 tablet (20 mg total) by mouth daily. 90 tablet 3   traZODone (DESYREL) 50 MG tablet Take 1 tablet (50 mg total) by mouth at bedtime as needed for sleep. 30 tablet 3   No current facility-administered medications for this visit.    No Known Allergies  Diagnoses:  NSTEMI (non-ST elevated myocardial infarction) ([place])  Anxiety with depression  Plan of Care: [name] is adjusting to her current health situation w/a second occurrence of a SCAD. She is concerned for her children & her Mother's situation. She will report on her visit to [facility] to see her Parents that is coming up when we have our next visit.  [place] Date: 05/03/2022  Progress: 5  Frequency: Twice monthly  Modality: Boykin Reaper, LMFT

## 2022-03-17 NOTE — Progress Notes (Signed)
                Autie Vasudevan L Daizha Anand, LMFT 

## 2022-03-18 ENCOUNTER — Encounter (HOSPITAL_COMMUNITY): Payer: BC Managed Care – PPO

## 2022-03-18 ENCOUNTER — Encounter (HOSPITAL_COMMUNITY)
Admission: RE | Admit: 2022-03-18 | Discharge: 2022-03-18 | Disposition: A | Discharge status: Home / Self Care | Payer: BC Managed Care – PPO | Source: Ambulatory Visit | Attending: Cardiovascular Disease | Admitting: Cardiovascular Disease

## 2022-03-18 DIAGNOSIS — I252 Old myocardial infarction: Secondary | ICD-10-CM | POA: Diagnosis not present

## 2022-03-18 DIAGNOSIS — I214 Non-ST elevation (NSTEMI) myocardial infarction: Secondary | ICD-10-CM

## 2022-03-19 IMAGING — MG MM DIGITAL SCREENING BILAT W/ TOMO AND CAD
8 series · 8 of 24 positions shown · non-contrast
Comparison: Previous exam(s).

CLINICAL DATA: Screening.

EXAM:
DIGITAL SCREENING BILATERAL MAMMOGRAM WITH TOMOSYNTHESIS AND CAD
TECHNIQUE: Bilateral screening digital craniocaudal and mediolateral oblique
mammograms were obtained. Bilateral screening digital breast
tomosynthesis was performed. The images were evaluated with
computer-aided detection.

[R CC synth-2D]
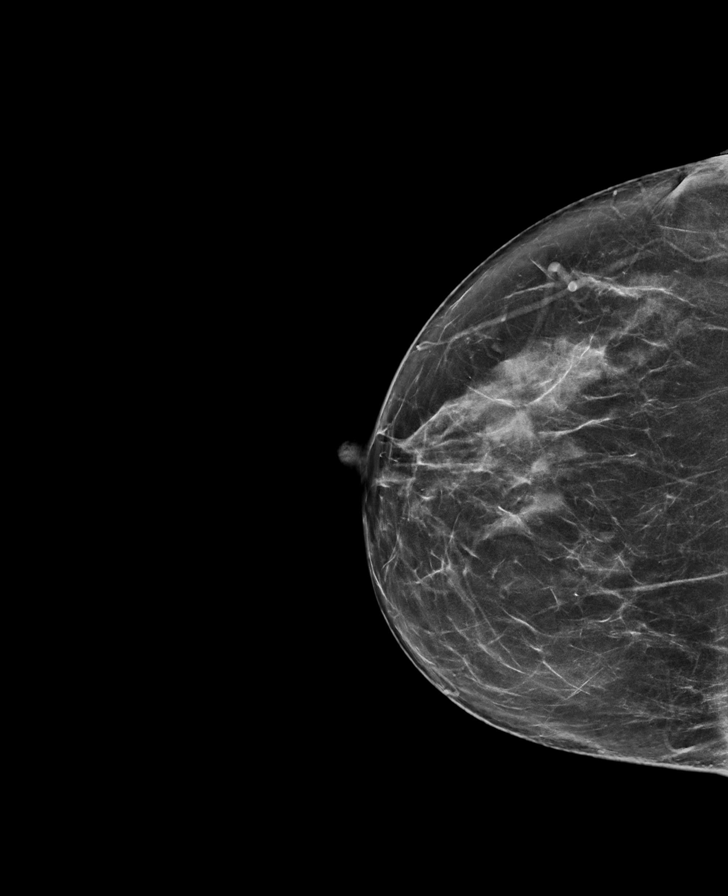

[R MLO synth-2D]
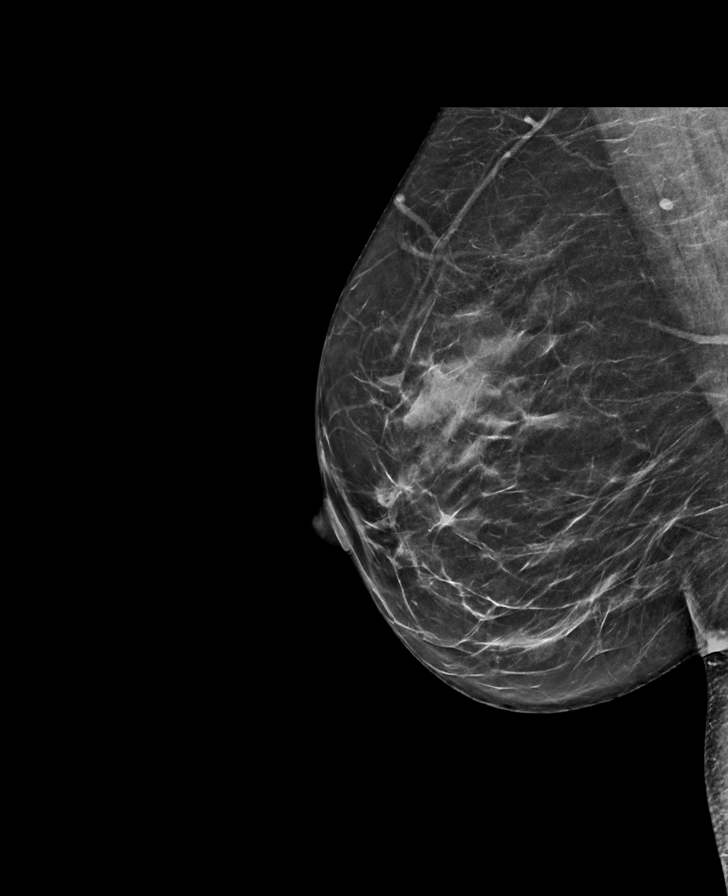

[L CC synth-2D]
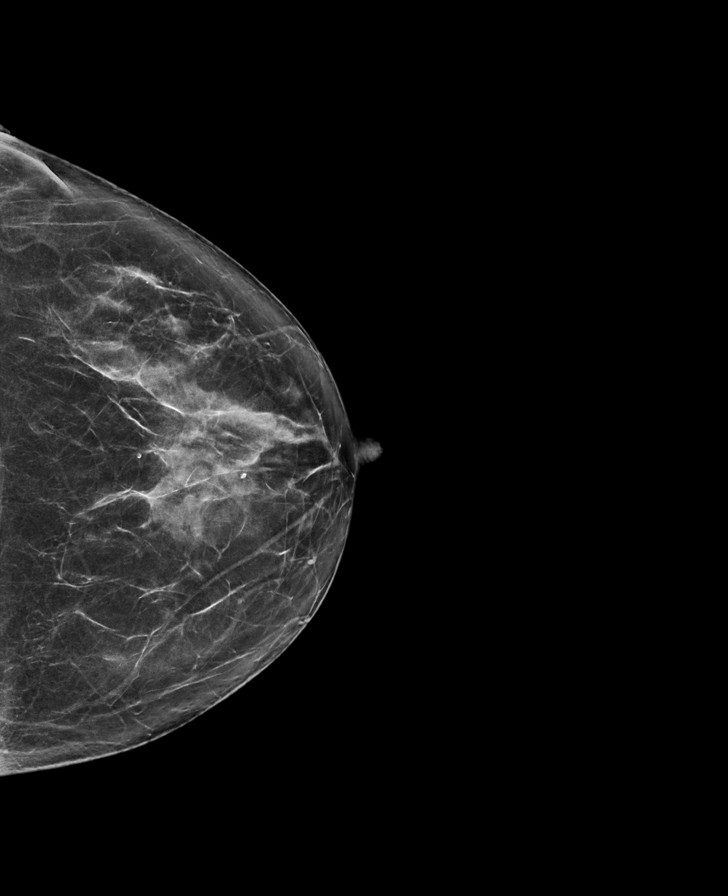

[L MLO synth-2D]
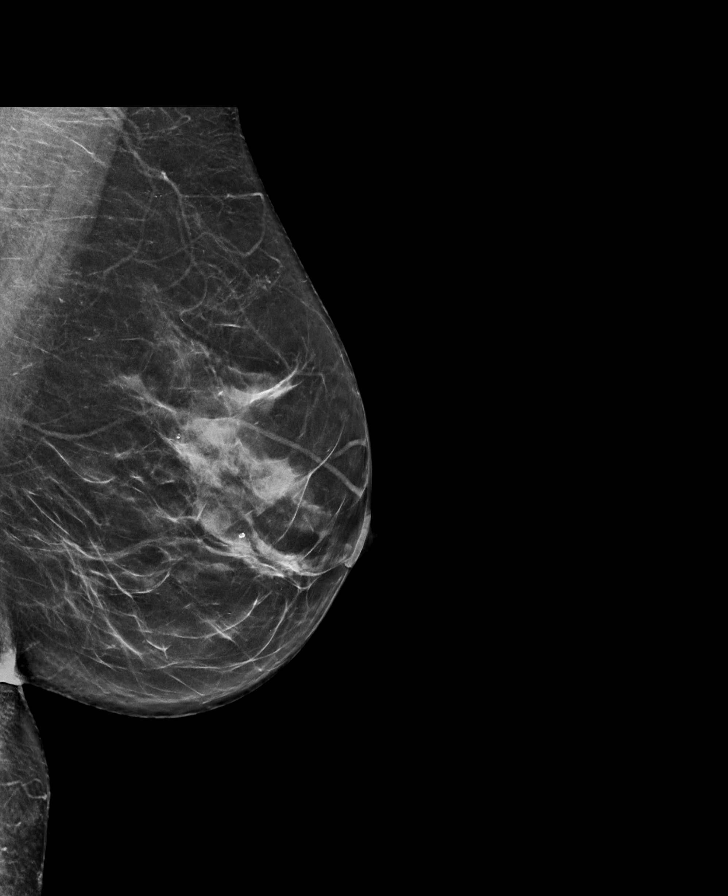

[L MLO tomo · tomo slice 35/68.0]
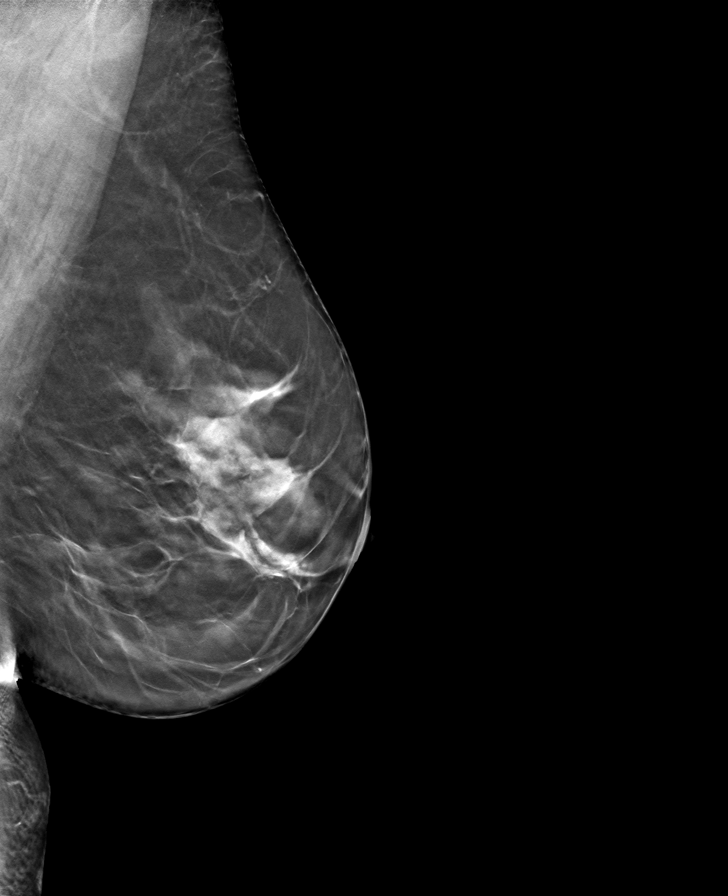

[L CC tomo · tomo slice 37/72.0]
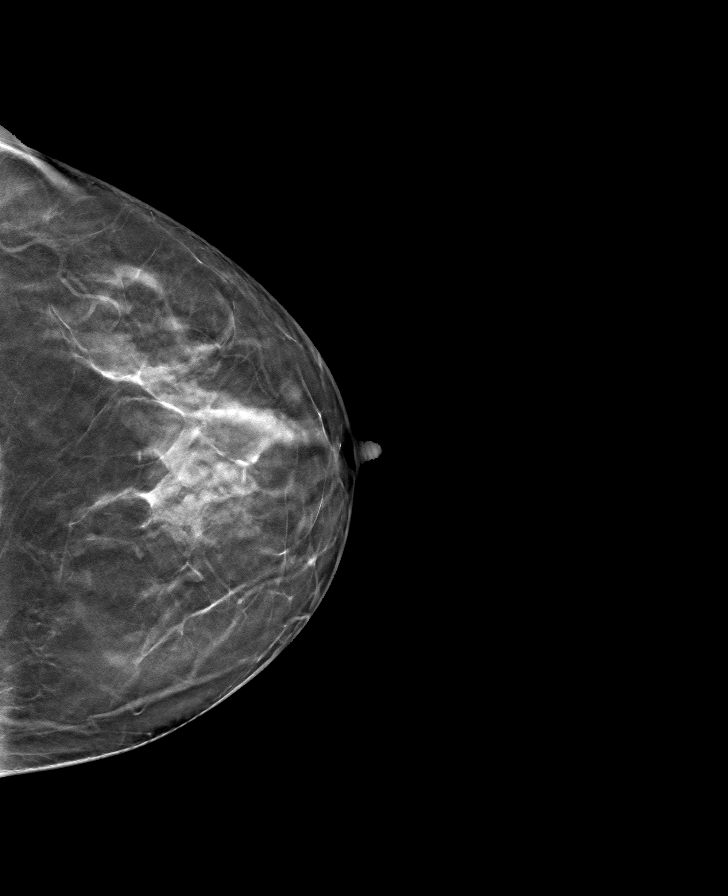

[R CC tomo · tomo slice 39/76.0]
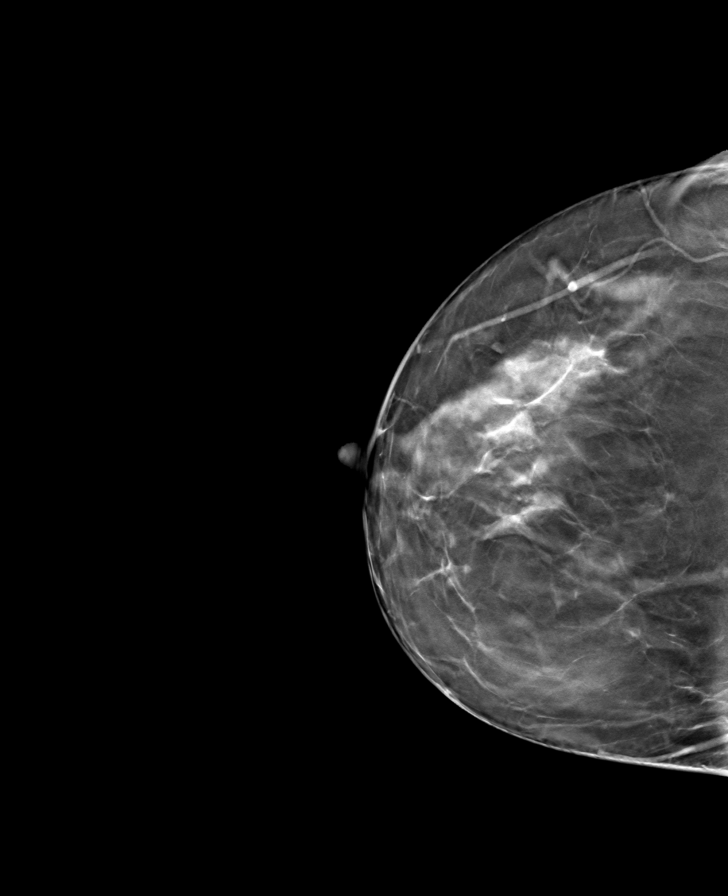

[R MLO tomo · tomo slice 35/68.0]
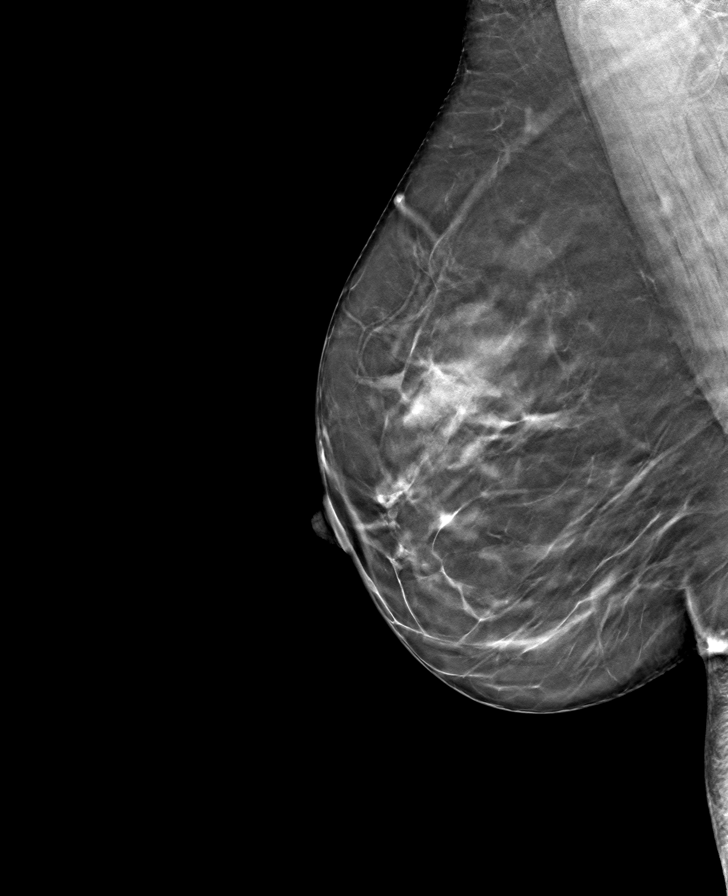

[8 of 24 positions shown; findings below may reference images not displayed]

ACR Breast Density Category c: The breast tissue is heterogeneously
dense, which may obscure small masses.
FINDINGS: There are no findings suspicious for malignancy. The images were
evaluated with computer-aided detection.
IMPRESSION: No mammographic evidence of malignancy. A result letter of this
screening mammogram will be mailed directly to the patient.

RECOMMENDATION:
Screening mammogram in one year. (Code:T4-5-GWO)

BI-RADS CATEGORY  1: Negative.

## 2022-03-21 ENCOUNTER — Encounter (HOSPITAL_COMMUNITY)
Admission: RE | Admit: 2022-03-21 | Discharge: 2022-03-21 | Disposition: A | Discharge status: Home / Self Care | Payer: BC Managed Care – PPO | Source: Ambulatory Visit | Attending: Cardiovascular Disease | Admitting: Cardiovascular Disease

## 2022-03-21 ENCOUNTER — Encounter (HOSPITAL_COMMUNITY): Payer: BC Managed Care – PPO

## 2022-03-21 DIAGNOSIS — I252 Old myocardial infarction: Secondary | ICD-10-CM | POA: Diagnosis not present

## 2022-03-21 DIAGNOSIS — I214 Non-ST elevation (NSTEMI) myocardial infarction: Secondary | ICD-10-CM

## 2022-03-23 ENCOUNTER — Encounter (HOSPITAL_COMMUNITY)
Admission: RE | Admit: 2022-03-23 | Discharge: 2022-03-23 | Disposition: A | Discharge status: Home / Self Care | Payer: BC Managed Care – PPO | Source: Ambulatory Visit | Attending: Cardiovascular Disease | Admitting: Cardiovascular Disease

## 2022-03-23 ENCOUNTER — Encounter (HOSPITAL_COMMUNITY): Payer: BC Managed Care – PPO

## 2022-03-23 DIAGNOSIS — I252 Old myocardial infarction: Secondary | ICD-10-CM | POA: Diagnosis not present

## 2022-03-23 DIAGNOSIS — I214 Non-ST elevation (NSTEMI) myocardial infarction: Secondary | ICD-10-CM

## 2022-03-23 NOTE — Progress Notes (Signed)
Cardiac Individual Treatment Plan  Patient Details  Name: Tina Juarez MRN: [id number] Date of Birth: February 11, 1965 Referring Provider:   Flowsheet Row INTENSIVE CARDIAC REHAB ORIENT from 02/02/2022 in [facility] for Heart, V[facility], & [place]  Referring Provider Talbot Grumbling, MD       Initial Encounter Date:  [place] from 02/02/2022 in [facility] for Heart, V[facility], & [facility]  Date 02/02/22       Visit Diagnosis: NSTEMI (non-ST elevated myocardial infarction) [facility])  Patient's Home Medications on Admission:  Current Outpatient Medications:    acetaminophen (TYLENOL) 500 MG tablet, Take 1,000 mg by mouth every 6 (six) hours as needed for mild pain., Disp: , Rfl:    aspirin 81 MG tablet, Take 1 tablet (81 mg total) by mouth daily., Disp: 30 tablet, Rfl: 0   carvedilol (COREG) 3.125 MG tablet, Take 1 tablet (3.125 mg total) by mouth 2 (two) times daily with a meal., Disp: 180 tablet, Rfl: 3   melatonin 1 MG TABS tablet, Take 2 mg by mouth at bedtime as needed (sleep)., Disp: , Rfl:    nitroGLYCERIN (NITROSTAT) 0.4 MG SL tablet, Place 1 tablet (0.4 mg total) under the tongue every 5 (five) minutes x 3 doses as needed for chest pain., Disp: 25 tablet, Rfl: 1   rosuvastatin (CRESTOR) 20 MG tablet, Take 1 tablet (20 mg total) by mouth daily., Disp: 90 tablet, Rfl: 3   traZODone (DESYREL) 50 MG tablet, Take 1 tablet (50 mg total) by mouth at bedtime as needed for sleep., Disp: 30 tablet, Rfl: 3  Past Medical History: Past Medical History:  Diagnosis Date   Cataract    removed bilat 03-2017   Myocardial infarction ([place]) 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   was living in [place] at that time (she does not have fibromuscular dysplasia) // Echo 5/22: EF 55-60, no RWMA, GLS -25.3%, normal RVSF, RVSP 20.3, trivial MR     Tobacco Use: Social History   Tobacco Use  Smoking Status Never  Smokeless Tobacco Never    Labs: Review Flowsheet  More data may exist      Latest Ref Rng & Units 03/14/2017 05/13/2019 02/27/2020 10/27/2021 12/20/2021  Labs for ITP Cardiac and Pulmonary Rehab  Cholestrol 0 - 200 mg/dL 173  181  171  176  175   LDL (calc) 0 - 99 mg/dL 96  100  94  102  111   HDL-C >40 mg/dL 64  63  64.00  61.80  58   Trlycerides <150 mg/dL 64  99  63.0  61.0  32   Hemoglobin A1c 4.8 - 5.6 % - - - 5.8  5.7     Capillary Blood Glucose: No results found for: "GLUCAP"   Exercise Target Goals: Exercise Program Goal: Individual exercise prescription set using results from initial 6 min walk test and THRR while considering  patient's activity barriers and safety.   Exercise Prescription Goal: Initial exercise prescription builds to 30-45 minutes a day of aerobic activity, 2-3 days per week.  Home exercise guidelines will be given to patient during program as part of exercise prescription that the participant will acknowledge.  Activity Barriers & Risk Stratification:  Activity Barriers & Cardiac Risk Stratification - 02/02/22 1331       Activity Barriers & Cardiac Risk Stratification   Activity Barriers Joint Problems  Cardiac Risk Stratification High   <5 METS on 6MWT            6 Minute Walk:  6 Minute Walk     Row Name 02/02/22 1328         6 Minute Walk   Phase Initial     Distance 1440 feet     Walk Time 6 minutes     # of Rest Breaks 0     MPH 2.72     METS 3.68     RPE 10     Perceived Dyspnea  0     VO2 Peak 12.98     Symptoms Yes (comment)     Comments felt palpatations with PVCs, dizziness at end of test. no pain.     Resting HR 75 bpm     Resting BP 104/70     Resting Oxygen Saturation  98 %     Exercise Oxygen Saturation  during 6 min walk 99 %     Max Ex. HR 95 bpm     Max Ex. BP 108/76     2 Minute Post BP 92/68  BP recheck with orthostatics. sitting:  94/72. standing: 98/76              Oxygen Initial Assessment:   Oxygen Re-Evaluation:   Oxygen Discharge (Final Oxygen Re-Evaluation):   Initial Exercise Prescription:  Initial Exercise Prescription - 02/02/22 1300       Date of Initial Exercise RX and Referring Provider   Date 02/02/22    Referring Provider Talbot Grumbling, MD    Expected Discharge Date 04/01/22      Arm Ergometer   Level 1.5    RPM 50    Minutes 15    METs 3      Recumbant Elliptical   Level 2    RPM 60    Minutes 15    METs 3      Prescription Details   Frequency (times per week) 3    Duration Progress to 30 minutes of continuous aerobic without signs/symptoms of physical distress      Intensity   THRR 40-80% of Max Heartrate 65-131    Ratings of Perceived Exertion 11-13    Perceived Dyspnea 0-4      Progression   Progression Continue to progress workloads to maintain intensity without signs/symptoms of physical distress.      Resistance Training   Training Prescription Yes    Weight 3    Reps 10-15             Perform Capillary Blood Glucose checks as needed.  Exercise Prescription Changes:   Exercise Prescription Changes     Row Name 02/09/22 1400 02/23/22 1400 03/14/22 1200 03/16/22 1300       Response to Exercise   Blood Pressure (Admit) 100/64 98/62 110/60 108/64    Blood Pressure (Exercise) 126/80 122/68 128/84 130/86    Blood Pressure (Exit) 100/64 102/70 104/70 96/68    Heart Rate (Admit) 69 bpm 72 bpm 73 bpm 68 bpm    Heart Rate (Exercise) 89 bpm 113 bpm 105 bpm 102 bpm    Heart Rate (Exit) 68 bpm 73 bpm 82 bpm 77 bpm    Rating of Perceived Exertion (Exercise) 13 11 11 12     Symptoms None None None None    Comments Pt's first day in the CRP2 program Reviewed METs Reviewed METs and goals Reviewed Home exercise    Duration Continue with 30  min of aerobic exercise without signs/symptoms of physical distress. Continue with 30 min of aerobic exercise without  signs/symptoms of physical distress. Continue with 30 min of aerobic exercise without signs/symptoms of physical distress. Continue with 30 min of aerobic exercise without signs/symptoms of physical distress.    Intensity THRR unchanged THRR unchanged THRR unchanged THRR unchanged      Progression   Progression Continue to progress workloads to maintain intensity without signs/symptoms of physical distress. Continue to progress workloads to maintain intensity without signs/symptoms of physical distress. Continue to progress workloads to maintain intensity without signs/symptoms of physical distress. Continue to progress workloads to maintain intensity without signs/symptoms of physical distress.    Average METs 3.4 3.1 3.45 3.15      Resistance Training   Training Prescription Yes No Yes No    Weight No weights on Wednesday No weights on Wednesday 3 lbs No weights on Wednesdays    Reps 10-15 -- 10-15 --    Time 10 Minutes -- 10 Minutes --      Interval Training   Interval Training No No No No      Arm Ergometer   Level 1.5 2 2 2     Watts 3 3 19 15     RPM -- -- 60 50    Minutes 15 15 15 15     METs -- 2.5 2.8 2.4      Recumbant Elliptical   Level 1 2 2 3     RPM 60 51 57 57    Watts 73 77 87 83    Minutes 15 15 15 15     METs 3.4 3.7 4.1 3.9      Home Exercise Plan   Plans to continue exercise at -- -- -- Home (comment)    Frequency -- -- -- Add 4 additional days to program exercise sessions.    Initial Home Exercises Provided -- -- -- 03/16/22             Exercise Comments:   Exercise Comments     Row Name 02/09/22 1429 02/23/22 1426 03/14/22 1216 03/16/22 1404     Exercise Comments Pt's first day in the CRP2 program. Pt had no complaints and is off to a good start. Reviewed METs. Pt is progressing. Pt wants to advance workloads next session. Reviewed METs and goals. Pt will ibcrease workload on [place] next session. Reviewed Home exercise Rx. Pt is already walking at home.  Pt will continue to walk 4x/week(off days from the [place] program). Pt verbalized understnading of the home exercise Rx and was provided a copy.             Exercise Goals and Review:   Exercise Goals     Row Name 02/02/22 1331             Exercise Goals   Increase Physical Activity Yes       Intervention Provide advice, education, support and counseling about physical activity/exercise needs.;Develop an individualized exercise prescription for aerobic and resistive training based on initial evaluation findings, risk stratification, comorbidities and participant's personal goals.       Expected Outcomes Short Term: Attend rehab on a regular basis to increase amount of physical activity.;Long Term: Exercising regularly at least 3-5 days a week.;Long Term: Add in home exercise to make exercise part of routine and to increase amount of physical activity.       Increase Strength and Stamina Yes       Intervention Provide advice, education, support  and counseling about physical activity/exercise needs.;Develop an individualized exercise prescription for aerobic and resistive training based on initial evaluation findings, risk stratification, comorbidities and participant's personal goals.       Expected Outcomes Short Term: Increase workloads from initial exercise prescription for resistance, speed, and METs.;Short Term: Perform resistance training exercises routinely during rehab and add in resistance training at home;Long Term: Improve cardiorespiratory fitness, muscular endurance and strength as measured by increased METs and functional capacity (6MWT)       Able to understand and use rate of perceived exertion (RPE) scale Yes       Intervention Provide education and explanation on how to use RPE scale       Expected Outcomes Short Term: Able to use RPE daily in rehab to express subjective intensity level;Long Term:  Able to use RPE to guide intensity level when exercising independently        Knowledge and understanding of Target Heart Rate Range (THRR) Yes       Intervention Provide education and explanation of THRR including how the numbers were predicted and where they are located for reference       Expected Outcomes Short Term: Able to state/look up THRR;Long Term: Able to use THRR to govern intensity when exercising independently;Short Term: Able to use daily as guideline for intensity in rehab       Understanding of Exercise Prescription Yes       Intervention Provide education, explanation, and written materials on patient's individual exercise prescription       Expected Outcomes Short Term: Able to explain program exercise prescription;Long Term: Able to explain home exercise prescription to exercise independently                Exercise Goals Re-Evaluation :  Exercise Goals Re-Evaluation     Row Name 02/09/22 1428 03/14/22 1216           Exercise Goal Re-Evaluation   Exercise Goals Review Increase Physical Activity;Able to understand and use rate of perceived exertion (RPE) scale;Increase Strength and Stamina;Knowledge and understanding of Target Heart Rate Range (THRR);Understanding of Exercise Prescription Increase Physical Activity;Able to understand and use rate of perceived exertion (RPE) scale;Increase Strength and Stamina;Knowledge and understanding of Target Heart Rate Range (THRR);Understanding of Exercise Prescription      Comments Pt's first day in the CRP2 program. Pt understands the exercise Rx, RPE scale and THRR Reviewed METs and goals. Pt is making progress. Feels stonger. Pt has much less anxiety with exercise.      Expected Outcomes Will continue to monitor patient and progress exercise workloads as tolerated. Will continue to monitor patient and progress exercise workloads as tolerated.               Discharge Exercise Prescription (Final Exercise Prescription Changes):  Exercise Prescription Changes - 03/16/22 1300       Response to  Exercise   Blood Pressure (Admit) 108/64    Blood Pressure (Exercise) 130/86    Blood Pressure (Exit) 96/68    Heart Rate (Admit) 68 bpm    Heart Rate (Exercise) 102 bpm    Heart Rate (Exit) 77 bpm    Rating of Perceived Exertion (Exercise) 12    Symptoms None    Comments Reviewed Home exercise    Duration Continue with 30 min of aerobic exercise without signs/symptoms of physical distress.    Intensity THRR unchanged      Progression   Progression Continue to progress workloads to maintain intensity without  signs/symptoms of physical distress.    Average METs 3.15      Resistance Training   Training Prescription No    Weight No weights on Wednesdays      Interval Training   Interval Training No      Arm Ergometer   Level 2    Watts 15    RPM 50    Minutes 15    METs 2.4      Recumbant Elliptical   Level 3    RPM 57    Watts 83    Minutes 15    METs 3.9      Home Exercise Plan   Plans to continue exercise at Home (comment)    Frequency Add 4 additional days to program exercise sessions.    Initial Home Exercises Provided 03/16/22             Nutrition:  Target Goals: Understanding of nutrition guidelines, daily intake of sodium 1500mg , cholesterol 200mg , calories 30% from fat and 7% or less from saturated fats, daily to have 5 or more servings of fruits and vegetables.  Biometrics:  Pre Biometrics - 02/02/22 1325       Pre Biometrics   Waist Circumference 35.25 inches    Hip Circumference 43 inches    Waist to Hip Ratio 0.82 %    Triceps Skinfold 38 mm    % Body Fat 39 %    Grip Strength 34 kg    Flexibility 18.5 in    Single Leg Stand 30 seconds              Nutrition Therapy Plan and Nutrition Goals:  Nutrition Therapy & Goals - 03/07/22 1009       Nutrition Therapy   Diet Heart Healthy Diet    Drug/Food Interactions Statins/Certain Fruits      Personal Nutrition Goals   Nutrition Goal Patient to identify strategies for reducing  cardiovascular risk by attending the weekly [employer] education and nutrition series    Personal Goal #2 Patient to improve diet quality by using the plate method as a daily guide for meal planning to include lean protein/plant protein, fruits, vegetables, whole grains, nonfat dairy as part of well balanced diet    Personal Goal #3 Patient to identify strategies for weight loss of 0.5-2.0# per week.    Comments Goals in action. Tina Juarez continues to attend the [employer] and nutrition session. She has implemented many plant based protein sources and high fiber foods. We have discussed strategies for weight loss. Tina Juarez will continue to benefit from particiation in intensive cardiac rehab for nutrition, exercise, and lifestyle modification.      Intervention Plan   Intervention Prescribe, educate and counsel regarding individualized specific dietary modifications aiming towards targeted core components such as weight, hypertension, lipid management, diabetes, heart failure and other comorbidities.;Nutrition handout(s) given to patient.    Expected Outcomes Short Term Goal: Understand basic principles of dietary content, such as calories, fat, sodium, cholesterol and nutrients.;Long Term Goal: Adherence to prescribed nutrition plan.             Nutrition Assessments:  Nutrition Assessments - 02/10/22 0915       Rate Your Plate Scores   Pre Score 77            MEDIFICTS Score Key: ?70 Need to make dietary changes  40-70 Heart Healthy Diet ? 40 Therapeutic Level Cholesterol Diet   Flowsheet Row INTENSIVE CARDIAC REHAB from 02/09/2022 in [facility]  [place] for Heart, Vascular, & Lung Health  Picture Your Plate Total Score on Admission 77      Picture Your Plate Scores: [id number] Unhealthy dietary pattern with much room for improvement. 41-50 Dietary pattern unlikely to meet recommendations for good health and room for improvement. 51-60 More healthful dietary pattern,  with some room for improvement.  >60 Healthy dietary pattern, although there may be some specific behaviors that could be improved.    Nutrition Goals Re-Evaluation:  Nutrition Goals Re-Evaluation     [place] Name 02/10/22 0907 03/07/22 1009           Goals   Current Weight 173 lb 4.5 oz (78.6 kg) 170 lb 13.7 oz (77.5 kg)      Comment A1c 5.7, LDL 111 No new labs; most recent labs  A1c 5.7, LDL 111. She has maintained her weight since starting with our program.      Expected Outcome Tina Juarez has medical history of spontaneou dissection of coronary artery (2017), NSTEMI 12/2021. She was taking phentermine for weight loss during this time. She has previously seen an RD for nutrition support 11/29/2021. She is motivated to lose to 160#. Tina Juarez will benefit from participation in intensive cardiac rehab for nutrition, exercise,and lifestyle modifcation. Goals in action. Tina Juarez continues to attend the [employer] and nutrition session. She has implemented many plant based protein sources and high fiber foods. We have discussed strategies for weight loss. Tina Juarez will continue to benefit from particiation in intensive cardiac rehab for nutrition, exercise, and lifestyle modification.               Nutrition Goals Re-Evaluation:  Nutrition Goals Re-Evaluation     [place] Name 02/10/22 0907 03/07/22 1009           Goals   Current Weight 173 lb 4.5 oz (78.6 kg) 170 lb 13.7 oz (77.5 kg)      Comment A1c 5.7, LDL 111 No new labs; most recent labs  A1c 5.7, LDL 111. She has maintained her weight since starting with our program.      Expected Outcome Tina Juarez has medical history of spontaneou dissection of coronary artery (2017), NSTEMI 12/2021. She was taking phentermine for weight loss during this time. She has previously seen an RD for nutrition support 11/29/2021. She is motivated to lose to 160#. Tina Juarez will benefit from participation in intensive cardiac rehab for nutrition, exercise,and lifestyle  modifcation. Goals in action. Tina Juarez continues to attend the [employer] and nutrition session. She has implemented many plant based protein sources and high fiber foods. We have discussed strategies for weight loss. Tina Juarez will continue to benefit from particiation in intensive cardiac rehab for nutrition, exercise, and lifestyle modification.               Nutrition Goals Discharge (Final Nutrition Goals Re-Evaluation):  Nutrition Goals Re-Evaluation - 03/07/22 1009       Goals   Current Weight 170 lb 13.7 oz (77.5 kg)    Comment No new labs; most recent labs  A1c 5.7, LDL 111. She has maintained her weight since starting with our program.    Expected Outcome Goals in action. Tina Juarez continues to attend the [employer] and nutrition session. She has implemented many plant based protein sources and high fiber foods. We have discussed strategies for weight loss. Tina Juarez will continue to benefit from particiation in intensive cardiac rehab for nutrition, exercise, and lifestyle modification.  Psychosocial: Target Goals: Acknowledge presence or absence of significant depression and/or stress, maximize coping skills, provide positive support system. Participant is able to verbalize types and ability to use techniques and skills needed for reducing stress and depression.  Initial Review & Psychosocial Screening:  Initial Psych Review & Screening - 02/02/22 1333       Initial Review   Current issues with Current Anxiety/Panic;Current Sleep Concerns;Current Stress Concerns    Source of Stress Concerns Chronic Illness      Family Dynamics   Good Support System? Yes   Tina Juarez has her husband and children for support   Comments Tina Juarez has had some recent trouble falling asleep and staying asleep which she attributes to medication side effects. Tina Juarez also voiced having some anxiety regarding her health and recurring SCAD. When offered resources to help with this anxiety Tina Juarez voiced  she used to do counseling for 10 years, she has since stopped but is interested in starting again. Counseling with Kirke Shaggy, PhD was introduced to her and she stated she would be interested in speaking with him for additional help.      Barriers   Psychosocial barriers to participate in program The patient should benefit from training in stress management and relaxation.      Screening Interventions   Interventions Encouraged to exercise;To provide support and resources with identified psychosocial needs;Provide feedback about the scores to participant;Other (comment)    Comments referral to Kirke Shaggy, PhD.    Expected Outcomes Short Term goal: Utilizing psychosocial counselor, staff and physician to assist with identification of specific Stressors or current issues interfering with healing process. Setting desired goal for each stressor or current issue identified.;Long Term Goal: Stressors or current issues are controlled or eliminated.;Short Term goal: Identification and review with participant of any Quality of Life or Depression concerns found by scoring the questionnaire.;Long Term goal: The participant improves quality of Life and PHQ9 Scores as seen by post scores and/or verbalization of changes             Quality of Life Scores:  Quality of Life - 02/02/22 1339       Quality of Life   Select Quality of Life      Quality of Life Scores   Health/Function Pre 21.1 %    Socioeconomic Pre 26.43 %    Psych/Spiritual Pre 25.75 %    Family Pre 27.3 %    GLOBAL Pre 24.02 %            Scores of 19 and below usually indicate a poorer quality of life in these areas.  A difference of  2-3 points is a clinically meaningful difference.  A difference of 2-3 points in the total score of the Quality of Life Index has been associated with significant improvement in overall quality of life, self-image, physical symptoms, and general health in studies assessing change in quality  of life.  PHQ-9: Review Flowsheet  More data exists      02/14/2022 02/02/2022 11/29/2021 10/22/2021 07/30/2021  Depression screen PHQ 2/9  Decreased Interest 0 0 0 0 0  Down, Depressed, Hopeless 0 1 0 0 0  PHQ - 2 Score 0 1 0 0 0  Altered sleeping 2 2 - 2 0  Tired, decreased energy 1 1 - 1 0  Change in appetite 0 1 - 0 0  Feeling bad or failure about yourself  0 1 - 1 0  Trouble concentrating 1 0 - 0 0  Moving slowly  or fidgety/restless 0 0 - 0 0  Suicidal thoughts 0 0 - 0 0  PHQ-9 Score 4 6 - 4 0  Difficult doing work/chores Somewhat difficult Somewhat difficult - Not difficult at all Not difficult at all   Interpretation of Total Score  Total Score Depression Severity:  1-4 = Minimal depression, 5-9 = Mild depression, 10-14 = Moderate depression, 15-19 = Moderately severe depression, 20-27 = Severe depression   Psychosocial Evaluation and Intervention:   Psychosocial Re-Evaluation:  Psychosocial Re-Evaluation     [place] Name 02/10/22 0850 02/28/22 1523 03/23/22 0828         Psychosocial Re-Evaluation   Current issues with Current Sleep Concerns;Current Stress Concerns;Current Anxiety/Panic Current Sleep Concerns;Current Stress Concerns;Current Anxiety/Panic Current Sleep Concerns;Current Stress Concerns;Current Anxiety/Panic     Comments Will review PHQ2-9 in the upcoming week. Tina Juarez did not voice any increased concerns or stressors on her first day of exercise Tina Juarez saw her primary care provider Dr Sarajane Jews on 02/14/22. Tina Juarez is now taking Trazadone and is going to meet with Dessie Coma Psyh D in the near future. Tina Juarez reports that she is feeling better. Tina Juarez reports feeling better she had an initial meeting with a therapist. Still has some family stressors     Expected Outcomes Tina Juarez will have controlled or decreased stress and anxiety upon completion of intensive cardiac rehab. Tina Juarez will have controlled or decreased stress and anxiety upon completion of intensive cardiac rehab. Tina Juarez  will have controlled or decreased stress and anxiety upon completion of intensive cardiac rehab.     Interventions Stress management education;Encouraged to attend Cardiac Rehabilitation for the exercise;Relaxation education Stress management education;Encouraged to attend Cardiac Rehabilitation for the exercise;Relaxation education Stress management education;Encouraged to attend Cardiac Rehabilitation for the exercise;Relaxation education     Continue Psychosocial Services  Follow up required by staff Follow up required by staff Follow up required by staff       Initial Review   Source of Stress Concerns Chronic Illness Chronic Illness Chronic Illness     Comments Will continue to monitor and offer support as needed Will continue to monitor and offer support as needed Will continue to monitor and offer support as needed              Psychosocial Discharge (Final Psychosocial Re-Evaluation):  Psychosocial Re-Evaluation - 03/23/22 0828       Psychosocial Re-Evaluation   Current issues with Current Sleep Concerns;Current Stress Concerns;Current Anxiety/Panic    Comments Tina Juarez reports feeling better she had an initial meeting with a therapist. Still has some family stressors    Expected Outcomes Tina Juarez will have controlled or decreased stress and anxiety upon completion of intensive cardiac rehab.    Interventions Stress management education;Encouraged to attend Cardiac Rehabilitation for the exercise;Relaxation education    Continue Psychosocial Services  Follow up required by staff      Initial Review   Source of Stress Concerns Chronic Illness    Comments Will continue to monitor and offer support as needed             Vocational Rehabilitation: Provide vocational rehab assistance to qualifying candidates.   Vocational Rehab Evaluation & Intervention:  Vocational Rehab - 02/02/22 1340       Initial Vocational Rehab Evaluation & Intervention   Assessment shows need for  Vocational Rehabilitation No   Tina Juarez is currently working and does not need vocational rehab at this time            Education: Education Goals:  Education classes will be provided on a weekly basis, covering required topics. Participant will state understanding/return demonstration of topics presented.    Education     Row Name 02/09/22 1600     Education   Cardiac Education Topics [employer]   Educator Dietitian   Weekly Topic One-Pot Wonders   Instruction Review Code 1- Verbalizes Understanding   Class Start Time 1400   Class Stop Time 1450   Class Time Calculation (min) 50 min    [place] Name 02/14/22 1500     Education   Cardiac Education Topics Pritikin   Environmental consultant Psychosocial   Psychosocial Workshop Healthy Sleep for a Healthy Heart   Instruction Review Code 1- Verbalizes Understanding   Class Start Time 1400   Class Stop Time 1450   Class Time Calculation (min) 50 min    [place] Name 02/16/22 1500     Education   Cardiac Education Topics Pritikin   Financial trader   Weekly Topic Comforting Weekend Breakfasts   Instruction Review Code 1- Verbalizes Understanding   Class Start Time 1400   Class Stop Time 1445   Class Time Calculation (min) 45 min    [place] Name 02/18/22 1500     Education   Cardiac Education Topics Pritikin   Lexicographer Nutrition   Nutrition Dining Out - Part 1   Instruction Review Code 1- Verbalizes Understanding   Class Start Time 1405   Class Stop Time 1440   Class Time Calculation (min) 35 min    [place] Name 02/21/22 0900     Education   Cardiac Education Topics Pritikin   Select Core Videos     Core Videos   Educator Exercise Physiologist   Select Exercise Education   Exercise Education Biomechanial Limitations    Instruction Review Code 1- Verbalizes Understanding   Class Start Time [phone]   Class Stop Time 0845   Class Time Calculation (min) 35 min    [place] Name 02/28/22 1000     Education   Cardiac Education Topics Pritikin   Financial trader   Weekly Topic [employer]- Spotlight on the Minimally Invasive Surgery [place] Zones   Instruction Review Code 1- Verbalizes Understanding   Class Start Time [phone]   Class Stop Time 0850   Class Time Calculation (min) 38 min    [place] Name 03/02/22 0900     Education   Cardiac Education Topics Pritikin   Select Core Videos     Core Videos   Educator Dietitian   Select Nutrition   Nutrition Facts on Fat   Instruction Review Code 1- Verbalizes Understanding   Class Start Time 0815   Class Stop Time 0900   Class Time Calculation (min) 45 min    [place] Name 03/07/22 0900     Education   Cardiac Education Topics Pritikin   Select Workshops     Workshops   Educator Exercise Physiologist   Select Psychosocial   Psychosocial Workshop Other  From [employer] of a Healthy Outlook   Instruction Review Code 1- Verbalizes Understanding   Class Start Time [phone]   Class Stop Time 0856   Class Time Calculation (  min) 44 min    [place] Name 03/14/22 0900     Education   Cardiac Education Topics --   Select --     Workshops   Educator --   Select --   Exercise Workshop --   Instruction Review Code --   Class Start Time --   Class Stop Time --   Class Time Calculation (min) --    [place] Name 03/16/22 1000     Education   Cardiac Education Topics [employer]   Educator Dietitian   Weekly Topic Fast and Healthy Breakfasts   Instruction Review Code 1- Verbalizes Understanding   Class Start Time 0813   Class Stop Time 0856   Class Time Calculation (min) 43 min    [place] Name 03/18/22 0900     Education   Cardiac Education Topics Pritikin   Select Core Videos     Core Videos    Educator Dietitian   Select Nutrition   Nutrition Overview of the [place]   Instruction Review Code 1- Verbalizes Understanding   Class Start Time 0815   Class Stop Time 0905   Class Time Calculation (min) 50 min    [place] Name 03/21/22 0900     Education   Cardiac Education Topics Pritikin   Select Workshops     Workshops   Educator Exercise Physiologist   Select Psychosocial   Psychosocial Workshop Recognizing and Reducing Stress   Instruction Review Code 1- Verbalizes Understanding   Class Start Time [phone]   Class Stop Time [phone]   Class Time Calculation (min) 41 min            Core Videos: Exercise    Move It!  Clinical staff conducted group or individual video education with verbal and written material and guidebook.  Patient learns the recommended Pritikin exercise program. Exercise with the goal of living a long, healthy life. Some of the health benefits of exercise include controlled diabetes, healthier blood pressure levels, improved cholesterol levels, improved heart and lung capacity, improved sleep, and better body composition. Everyone should speak with their doctor before starting or changing an exercise routine.  Biomechanical Limitations Clinical staff conducted group or individual video education with verbal and written material and guidebook.  Patient learns how biomechanical limitations can impact exercise and how we can mitigate and possibly overcome limitations to have an impactful and balanced exercise routine.  Body Composition Clinical staff conducted group or individual video education with verbal and written material and guidebook.  Patient learns that body composition (ratio of muscle mass to fat mass) is a key component to assessing overall fitness, rather than body weight alone. Increased fat mass, especially visceral belly fat, can put [place] at increased risk for metabolic syndrome, type 2 diabetes, heart disease, and even death. It is  recommended to combine diet and exercise (cardiovascular and resistance training) to improve your body composition. Seek guidance from your physician and exercise physiologist before implementing an exercise routine.  Exercise Action Plan Clinical staff conducted group or individual video education with verbal and written material and guidebook.  Patient learns the recommended strategies to achieve and enjoy long-term exercise adherence, including variety, self-motivation, self-efficacy, and positive decision making. Benefits of exercise include fitness, good health, weight management, more energy, better sleep, less stress, and overall well-being.  Medical   Heart Disease Risk Reduction Clinical staff conducted group or individual video education with verbal and written material  and guidebook.  Patient learns our heart is our most vital organ as it circulates oxygen, nutrients, white blood cells, and hormones throughout the entire body, and carries waste away. Data supports a plant-based eating plan like the Pritikin Program for its effectiveness in slowing progression of and reversing heart disease. The video provides a number of recommendations to address heart disease.   Metabolic Syndrome and Belly Fat  Clinical staff conducted group or individual video education with verbal and written material and guidebook.  Patient learns what metabolic syndrome is, how it leads to heart disease, and how one can reverse it and keep it from coming back. You have metabolic syndrome if you have 3 of the following 5 criteria: abdominal obesity, high blood pressure, high triglycerides, low HDL cholesterol, and high blood sugar.  Hypertension and Heart Disease Clinical staff conducted group or individual video education with verbal and written material and guidebook.  Patient learns that high blood pressure, or hypertension, is very common in the [place]. Hypertension is largely due to excessive salt  intake, but other important risk factors include being overweight, physical inactivity, drinking too much alcohol, smoking, and not eating enough potassium from fruits and vegetables. High blood pressure is a leading risk factor for heart attack, stroke, congestive heart failure, dementia, kidney failure, and premature death. Long-term effects of excessive salt intake include stiffening of the arteries and thickening of heart muscle and organ damage. Recommendations include ways to reduce hypertension and the risk of heart disease.  Diseases of Our Time - Focusing on Diabetes Clinical staff conducted group or individual video education with verbal and written material and guidebook.  Patient learns why the best way to stop diseases of our time is prevention, through food and other lifestyle changes. Medicine (such as prescription pills and surgeries) is often only a Band-Aid on the problem, not a long-term solution. Most common diseases of our time include obesity, type 2 diabetes, hypertension, heart disease, and cancer. The Pritikin Program is recommended and has been proven to help reduce, reverse, and/or prevent the damaging effects of metabolic syndrome.  Nutrition   Overview of the Pritikin Eating Plan  Clinical staff conducted group or individual video education with verbal and written material and guidebook.  Patient learns about the [place] for disease risk reduction. The [place] emphasizes a wide variety of unrefined, minimally-processed carbohydrates, like fruits, vegetables, whole grains, and legumes. Go, Caution, and Stop food choices are explained. Plant-based and lean animal proteins are emphasized. Rationale provided for low sodium intake for blood pressure control, low added sugars for blood sugar stabilization, and low added fats and oils for coronary artery disease risk reduction and weight management.  Calorie Density  Clinical staff conducted group or  individual video education with verbal and written material and guidebook.  Patient learns about calorie density and how it impacts the Pritikin Eating Plan. Knowing the characteristics of the food you choose will help you decide whether those foods will lead to weight gain or weight loss, and whether you want to consume more or less of them. Weight loss is usually a side effect of the Pritikin Eating Plan because of its focus on low calorie-dense foods.  Label Reading  Clinical staff conducted group or individual video education with verbal and written material and guidebook.  Patient learns about the Pritikin recommended label reading guidelines and corresponding recommendations regarding calorie density, added sugars, sodium content, and whole grains.  Dining Out - Part 1  Clinical staff conducted group or individual video education with verbal and written material and guidebook.  Patient learns that restaurant meals can be sabotaging because they can be so high in calories, fat, sodium, and/or sugar. Patient learns recommended strategies on how to positively address this and avoid unhealthy pitfalls.  Facts on Fats  Clinical staff conducted group or individual video education with verbal and written material and guidebook.  Patient learns that lifestyle modifications can be just as effective, if not more so, as many medications for lowering your risk of heart disease. A Pritikin lifestyle can help to reduce your risk of inflammation and atherosclerosis (cholesterol build-up, or plaque, in the artery walls). Lifestyle interventions such as dietary choices and physical activity address the cause of atherosclerosis. A review of the types of fats and their impact on blood cholesterol levels, along with dietary recommendations to reduce fat intake is also included.  Nutrition Action Plan  Clinical staff conducted group or individual video education with verbal and written material and guidebook.   Patient learns how to incorporate Pritikin recommendations into their lifestyle. Recommendations include planning and keeping personal health goals in mind as an important part of their success.  Healthy Mind-Set    Healthy Minds, Bodies, Hearts  Clinical staff conducted group or individual video education with verbal and written material and guidebook.  Patient learns how to identify when they are stressed. Video will discuss the impact of that stress, as well as the many benefits of stress management. Patient will also be introduced to stress management techniques. The way we think, act, and feel has an impact on our hearts.  How Our Thoughts Can Heal Our Hearts  Clinical staff conducted group or individual video education with verbal and written material and guidebook.  Patient learns that negative thoughts can cause depression and anxiety. This can result in negative lifestyle behavior and serious health problems. Cognitive behavioral therapy is an effective method to help control our thoughts in order to change and improve our emotional outlook.  Additional Videos:  Exercise    Improving Performance  Clinical staff conducted group or individual video education with verbal and written material and guidebook.  Patient learns to use a non-linear approach by alternating intensity levels and lengths of time spent exercising to help burn more calories and lose more body fat. Cardiovascular exercise helps improve heart health, metabolism, hormonal balance, blood sugar control, and recovery from fatigue. Resistance training improves strength, endurance, balance, coordination, reaction time, metabolism, and muscle mass. Flexibility exercise improves circulation, posture, and balance. Seek guidance from your physician and exercise physiologist before implementing an exercise routine and learn your capabilities and proper form for all exercise.  Introduction to Yoga  Clinical staff conducted group or  individual video education with verbal and written material and guidebook.  Patient learns about yoga, a discipline of the coming together of mind, breath, and body. The benefits of yoga include improved flexibility, improved range of motion, better posture and core strength, increased lung function, weight loss, and positive self-image. Yoga's heart health benefits include lowered blood pressure, healthier heart rate, decreased cholesterol and triglyceride levels, improved immune function, and reduced stress. Seek guidance from your physician and exercise physiologist before implementing an exercise routine and learn your capabilities and proper form for all exercise.  Medical   Aging: Enhancing Your Quality of Life  Clinical staff conducted group or individual video education with verbal and written material and guidebook.  Patient learns key strategies and recommendations to stay  in good physical health and enhance quality of life, such as prevention strategies, having an advocate, securing a Health Care Proxy and Power of Attorney, and keeping a list of medications and system for tracking them. It also discusses how to avoid risk for bone loss.  Biology of Weight Control  Clinical staff conducted group or individual video education with verbal and written material and guidebook.  Patient learns that weight gain occurs because we consume more calories than we burn (eating more, moving less). Even if your body weight is normal, you may have higher ratios of fat compared to muscle mass. Too much body fat puts you at increased risk for cardiovascular disease, heart attack, stroke, type 2 diabetes, and obesity-related cancers. In addition to exercise, following the [place] can help reduce your risk.  Decoding Lab Results  Clinical staff conducted group or individual video education with verbal and written material and guidebook.  Patient learns that lab test reflects one measurement whose  values change over time and are influenced by many factors, including medication, stress, sleep, exercise, food, hydration, pre-existing medical conditions, and more. It is recommended to use the knowledge from this video to become more involved with your lab results and evaluate your numbers to speak with your doctor.   Diseases of Our Time - Overview  Clinical staff conducted group or individual video education with verbal and written material and guidebook.  Patient learns that according to the CDC, 50% to 70% of chronic diseases (such as obesity, type 2 diabetes, elevated lipids, hypertension, and heart disease) are avoidable through lifestyle improvements including healthier food choices, listening to satiety cues, and increased physical activity.  Sleep Disorders Clinical staff conducted group or individual video education with verbal and written material and guidebook.  Patient learns how good quality and duration of sleep are important to overall health and well-being. Patient also learns about sleep disorders and how they impact health along with recommendations to address them, including discussing with a physician.  Nutrition  Dining Out - Part 2 Clinical staff conducted group or individual video education with verbal and written material and guidebook.  Patient learns how to plan ahead and communicate in order to maximize their dining experience in a healthy and nutritious manner. Included are recommended food choices based on the type of restaurant the patient is visiting.   Fueling a Best boy conducted group or individual video education with verbal and written material and guidebook.  There is a strong connection between our food choices and our health. Diseases like obesity and type 2 diabetes are very prevalent and are in large-part due to lifestyle choices. The Pritikin Eating Plan provides plenty of food and hunger-curbing satisfaction. It is easy to follow,  affordable, and helps reduce health risks.  Menu Workshop  Clinical staff conducted group or individual video education with verbal and written material and guidebook.  Patient learns that restaurant meals can sabotage health goals because they are often packed with calories, fat, sodium, and sugar. Recommendations include strategies to plan ahead and to communicate with the manager, chef, or server to help order a healthier meal.  Planning Your Eating Strategy  Clinical staff conducted group or individual video education with verbal and written material and guidebook.  Patient learns about the [place] and its benefit of reducing the risk of disease. The [place] does not focus on calories. Instead, it emphasizes high-quality, nutrient-rich foods. By knowing the characteristics of the foods,  we choose, we can determine their calorie density and make informed decisions.  Targeting Your Nutrition Priorities  Clinical staff conducted group or individual video education with verbal and written material and guidebook.  Patient learns that lifestyle habits have a tremendous impact on disease risk and progression. This video provides eating and physical activity recommendations based on your personal health goals, such as reducing LDL cholesterol, losing weight, preventing or controlling type 2 diabetes, and reducing high blood pressure.  Vitamins and Minerals  Clinical staff conducted group or individual video education with verbal and written material and guidebook.  Patient learns different ways to obtain key vitamins and minerals, including through a recommended healthy diet. It is important to discuss all supplements you take with your doctor.   Healthy Mind-Set    Smoking Cessation  Clinical staff conducted group or individual video education with verbal and written material and guidebook.  Patient learns that cigarette smoking and tobacco addiction pose a serious health  risk which affects millions of people. Stopping smoking will significantly reduce the risk of heart disease, lung disease, and many forms of cancer. Recommended strategies for quitting are covered, including working with your doctor to develop a successful plan.  Culinary   Becoming a Financial trader conducted group or individual video education with verbal and written material and guidebook.  Patient learns that cooking at home can be healthy, cost-effective, quick, and puts them in control. Keys to cooking healthy recipes will include looking at your recipe, assessing your equipment needs, planning ahead, making it simple, choosing cost-effective seasonal ingredients, and limiting the use of added fats, salts, and sugars.  Cooking - Breakfast and Snacks  Clinical staff conducted group or individual video education with verbal and written material and guidebook.  Patient learns how important breakfast is to satiety and nutrition through the entire day. Recommendations include key foods to eat during breakfast to help stabilize blood sugar levels and to prevent overeating at meals later in the day. Planning ahead is also a key component.  Cooking - Human resources officer conducted group or individual video education with verbal and written material and guidebook.  Patient learns eating strategies to improve overall health, including an approach to cook more at home. Recommendations include thinking of animal protein as a side on your plate rather than center stage and focusing instead on lower calorie dense options like vegetables, fruits, whole grains, and plant-based proteins, such as beans. Making sauces in large quantities to freeze for later and leaving the skin on your vegetables are also recommended to maximize your experience.  Cooking - Healthy Salads and Dressing Clinical staff conducted group or individual video education with verbal and written material and  guidebook.  Patient learns that vegetables, fruits, whole grains, and legumes are the foundations of the [place]. Recommendations include how to incorporate each of these in flavorful and healthy salads, and how to create homemade salad dressings. Proper handling of ingredients is also covered. Cooking - Soups and [employer] - Soups and Desserts Clinical staff conducted group or individual video education with verbal and written material and guidebook.  Patient learns that Pritikin soups and desserts make for easy, nutritious, and delicious snacks and meal components that are low in sodium, fat, sugar, and calorie density, while high in vitamins, minerals, and filling fiber. Recommendations include simple and healthy ideas for soups and desserts.   Overview     The Pritikin Solution Program Overview Clinical  staff conducted group or individual video education with verbal and written material and guidebook.  Patient learns that the results of the [place] Program have been documented in more than 100 articles published in peer-reviewed journals, and the benefits include reducing risk factors for (and, in some cases, even reversing) high cholesterol, high blood pressure, type 2 diabetes, obesity, and more! An overview of the three key pillars of the Pritikin Program will be covered: eating well, doing regular exercise, and having a healthy mind-set.  WORKSHOPS  Exercise: Exercise Basics: Building Your Action Plan Clinical staff led group instruction and group discussion with PowerPoint presentation and patient guidebook. To enhance the learning environment the use of posters, models and videos may be added. At the conclusion of this workshop, patients will comprehend the difference between physical activity and exercise, as well as the benefits of incorporating both, into their routine. Patients will understand the FITT (Frequency, Intensity, Time, and Type) principle and how to  use it to build an exercise action plan. In addition, safety concerns and other considerations for exercise and cardiac rehab will be addressed by the presenter. The purpose of this lesson is to promote a comprehensive and effective weekly exercise routine in order to improve patients' overall level of fitness.   Managing Heart Disease: Your Path to a Healthier Heart Clinical staff led group instruction and group discussion with PowerPoint presentation and patient guidebook. To enhance the learning environment the use of posters, models and videos may be added.At the conclusion of this workshop, patients will understand the anatomy and physiology of the heart. Additionally, they will understand how Pritikin's three pillars impact the risk factors, the progression, and the management of heart disease.  The purpose of this lesson is to provide a high-level overview of the heart, heart disease, and how the Pritikin lifestyle positively impacts risk factors.  Exercise Biomechanics Clinical staff led group instruction and group discussion with PowerPoint presentation and patient guidebook. To enhance the learning environment the use of posters, models and videos may be added. Patients will learn how the structural parts of their bodies function and how these functions impact their daily activities, movement, and exercise. Patients will learn how to promote a neutral spine, learn how to manage pain, and identify ways to improve their physical movement in order to promote healthy living. The purpose of this lesson is to expose patients to common physical limitations that impact physical activity. Participants will learn practical ways to adapt and manage aches and pains, and to minimize their effect on regular exercise. Patients will learn how to maintain good posture while sitting, walking, and lifting.  Balance Training and Fall Prevention  Clinical staff led group instruction and group discussion  with PowerPoint presentation and patient guidebook. To enhance the learning environment the use of posters, models and videos may be added. At the conclusion of this workshop, patients will understand the importance of their sensorimotor skills (vision, proprioception, and the vestibular system) in maintaining their ability to balance as they age. Patients will apply a variety of balancing exercises that are appropriate for their current level of function. Patients will understand the common causes for poor balance, possible solutions to these problems, and ways to modify their physical environment in order to minimize their fall risk. The purpose of this lesson is to teach patients about the importance of maintaining balance as they age and ways to minimize their risk of falling.  WORKSHOPS   Nutrition:  Fueling a Scientist, research (physical sciences) led  group instruction and group discussion with PowerPoint presentation and patient guidebook. To enhance the learning environment the use of posters, models and videos may be added. Patients will review the foundational principles of the [place] and understand what constitutes a serving size in each of the food groups. Patients will also learn Pritikin-friendly foods that are better choices when away from home and review make-ahead meal and snack options. Calorie density will be reviewed and applied to three nutrition priorities: weight maintenance, weight loss, and weight gain. The purpose of this lesson is to reinforce (in a group setting) the key concepts around what patients are recommended to eat and how to apply these guidelines when away from home by planning and selecting Pritikin-friendly options. Patients will understand how calorie density may be adjusted for different weight management goals.  Mindful Eating  Clinical staff led group instruction and group discussion with PowerPoint presentation and patient guidebook. To enhance the  learning environment the use of posters, models and videos may be added. Patients will briefly review the concepts of the [place] and the importance of low-calorie dense foods. The concept of mindful eating will be introduced as well as the importance of paying attention to internal hunger signals. Triggers for non-hunger eating and techniques for dealing with triggers will be explored. The purpose of this lesson is to provide patients with the opportunity to review the basic principles of the [place], discuss the value of eating mindfully and how to measure internal cues of hunger and fullness using the Hunger Scale. Patients will also discuss reasons for non-hunger eating and learn strategies to use for controlling emotional eating.  Targeting Your Nutrition Priorities Clinical staff led group instruction and group discussion with PowerPoint presentation and patient guidebook. To enhance the learning environment the use of posters, models and videos may be added. Patients will learn how to determine their genetic susceptibility to disease by reviewing their family history. Patients will gain insight into the importance of diet as part of an overall healthy lifestyle in mitigating the impact of genetics and other environmental insults. The purpose of this lesson is to provide patients with the opportunity to assess their personal nutrition priorities by looking at their family history, their own health history and current risk factors. Patients will also be able to discuss ways of prioritizing and modifying the [place] for their highest risk areas  Menu  Clinical staff led group instruction and group discussion with PowerPoint presentation and patient guidebook. To enhance the learning environment the use of posters, models and videos may be added. Using menus brought in from [employer], or printed from [employer], patients will apply the [place] dining out  guidelines that were presented in the [employer] video. Patients will also be able to practice these guidelines in a variety of provided scenarios. The purpose of this lesson is to provide patients with the opportunity to practice hands-on learning of the [place] with actual menus and practice scenarios.  Label Reading Clinical staff led group instruction and group discussion with PowerPoint presentation and patient guidebook. To enhance the learning environment the use of posters, models and videos may be added. Patients will review and discuss the Pritikin label reading guidelines presented in Pritikin's Label Reading Educational series video. Using fool labels brought in from local grocery stores and markets, patients will apply the label reading guidelines and determine if the packaged food meet the Pritikin guidelines. The purpose  of this lesson is to provide patients with the opportunity to review, discuss, and practice hands-on learning of the Pritikin Label Reading guidelines with actual packaged food labels. [facility] Workshops are designed to teach patients ways to prepare quick, simple, and affordable recipes at home. The importance of nutrition's role in chronic disease risk reduction is reflected in its emphasis in the overall Pritikin program. By learning how to prepare essential core Pritikin Eating Plan recipes, patients will increase control over what they eat; be able to customize the flavor of foods without the use of added salt, sugar, or fat; and improve the quality of the food they consume. By learning a set of core recipes which are easily assembled, quickly prepared, and affordable, patients are more likely to prepare more healthy foods at home. These workshops focus on convenient breakfasts, simple entres, side dishes, and desserts which can be prepared with minimal effort and are consistent with nutrition  recommendations for cardiovascular risk reduction. Cooking [employer] are taught by a Engineer, materials (RD) who has been trained by the [employer]. The chef or RD has a clear understanding of the importance of minimizing - if not completely eliminating - added fat, sugar, and sodium in recipes. Throughout the series of [place] Workshop sessions, patients will learn about healthy ingredients and efficient methods of cooking to build confidence in their capability to prepare    Cooking School weekly topics:  Adding Flavor- Sodium-Free  Fast and Healthy Breakfasts  Powerhouse Plant-Based Proteins  Satisfying Salads and Dressings  Simple Sides and Sauces  International Cuisine-Spotlight on the [place] Zones  Delicious Desserts  Savory Soups  [employer] - Meals in a Agricultural consultant Appetizers and Snacks  Comforting Weekend Breakfasts  One-Pot Wonders   Fast Evening Meals  Contractor Your Pritikin Plate  WORKSHOPS   Healthy Mindset (Psychosocial):  Focused Goals, Sustainable Changes Clinical staff led group instruction and group discussion with PowerPoint presentation and patient guidebook. To enhance the learning environment the use of posters, models and videos may be added. Patients will be able to apply effective goal setting strategies to establish at least one personal goal, and then take consistent, meaningful action toward that goal. They will learn to identify common barriers to achieving personal goals and develop strategies to overcome them. Patients will also gain an understanding of how our mind-set can impact our ability to achieve goals and the importance of cultivating a positive and growth-oriented mind-set. The purpose of this lesson is to provide patients with a deeper understanding of how to set and achieve personal goals, as well as the tools and strategies needed to overcome common obstacles which may arise along  the way.  From Head to Heart: The Power of a Healthy Outlook  Clinical staff led group instruction and group discussion with PowerPoint presentation and patient guidebook. To enhance the learning environment the use of posters, models and videos may be added. Patients will be able to recognize and describe the impact of emotions and mood on physical health. They will discover the importance of self-care and explore self-care practices which may work for them. Patients will also learn how to utilize the 4 C's to cultivate a healthier outlook and better manage stress and challenges. The purpose of this lesson is to demonstrate to patients how a healthy outlook is an essential part of maintaining good health, especially as they continue their cardiac rehab journey.  Healthy  Sleep for a Healthy Heart Clinical staff led group instruction and group discussion with PowerPoint presentation and patient guidebook. To enhance the learning environment the use of posters, models and videos may be added. At the conclusion of this workshop, patients will be able to demonstrate knowledge of the importance of sleep to overall health, well-being, and quality of life. They will understand the symptoms of, and treatments for, common sleep disorders. Patients will also be able to identify daytime and nighttime behaviors which impact sleep, and they will be able to apply these tools to help manage sleep-related challenges. The purpose of this lesson is to provide patients with a general overview of sleep and outline the importance of quality sleep. Patients will learn about a few of the most common sleep disorders. Patients will also be introduced to the concept of "sleep hygiene," and discover ways to self-manage certain sleeping problems through simple daily behavior changes. Finally, the workshop will motivate patients by clarifying the links between quality sleep and their goals of heart-healthy living.   Recognizing and  Reducing Stress Clinical staff led group instruction and group discussion with PowerPoint presentation and patient guidebook. To enhance the learning environment the use of posters, models and videos may be added. At the conclusion of this workshop, patients will be able to understand the types of stress reactions, differentiate between acute and chronic stress, and recognize the impact that chronic stress has on their health. They will also be able to apply different coping mechanisms, such as reframing negative self-talk. Patients will have the opportunity to practice a variety of stress management techniques, such as deep abdominal breathing, progressive muscle relaxation, and/or guided imagery.  The purpose of this lesson is to educate patients on the role of stress in their lives and to provide healthy techniques for coping with it.  Learning Barriers/Preferences:  Learning Barriers/Preferences - 02/02/22 1339       Learning Barriers/Preferences   Learning Barriers None    Learning Preferences Audio;Group Instruction;Computer/Internet;Individual Instruction;Pictoral;Skilled Demonstration;Video;Written Material;Verbal Instruction             Education Topics:  Knowledge Questionnaire Score:  Knowledge Questionnaire Score - 02/02/22 1340       Knowledge Questionnaire Score   Pre Score 23/24             Core Components/Risk Factors/Patient Goals at Admission:  Personal Goals and Risk Factors at Admission - 02/02/22 1340       Core Components/Risk Factors/Patient Goals on Admission    Weight Management Yes;Weight Loss    Intervention Weight Management: Develop a combined nutrition and exercise program designed to reach desired caloric intake, while maintaining appropriate intake of nutrient and fiber, sodium and fats, and appropriate energy expenditure required for the weight goal.;Weight Management: Provide education and appropriate resources to help participant work on and  attain dietary goals.    Goal Weight: Short Term 160 lb (72.6 kg)    Expected Outcomes Short Term: Continue to assess and modify interventions until short term weight is achieved;Long Term: Adherence to nutrition and physical activity/exercise program aimed toward attainment of established weight goal;Weight Loss: Understanding of general recommendations for a balanced deficit meal plan, which promotes 1-2 lb weight loss per week and includes a negative energy balance of [phone] kcal/d;Understanding recommendations for meals to include 15-35% energy as protein, 25-35% energy from fat, 35-60% energy from carbohydrates, less than 200mg  of dietary cholesterol, 20-35 gm of total fiber daily;Understanding of distribution of calorie intake throughout the day with the  consumption of 4-5 meals/snacks    Hypertension Yes    Intervention Monitor prescription use compliance.;Provide education on lifestyle modifcations including regular physical activity/exercise, weight management, moderate sodium restriction and increased consumption of fresh fruit, vegetables, and low fat dairy, alcohol moderation, and smoking cessation.    Expected Outcomes Long Term: Maintenance of blood pressure at goal levels.;Short Term: Continued assessment and intervention until BP is < 140/71mm HG in hypertensive participants. < 130/14mm HG in hypertensive participants with diabetes, heart failure or chronic kidney disease.    Lipids Yes    Intervention Provide education and support for participant on nutrition & aerobic/resistive exercise along with prescribed medications to achieve LDL 70mg , HDL >40mg .    Expected Outcomes Long Term: Cholesterol controlled with medications as prescribed, with individualized exercise RX and with personalized nutrition plan. Value goals: LDL < 70mg , HDL > 40 mg.;Short Term: Participant states understanding of desired cholesterol values and is compliant with medications prescribed. Participant is following  exercise prescription and nutrition guidelines.    Stress Yes    Intervention Offer individual and/or small group education and counseling on adjustment to heart disease, stress management and health-related lifestyle change. Teach and support self-help strategies.;Refer participants experiencing significant psychosocial distress to appropriate mental health specialists for further evaluation and treatment. When possible, include family members and significant others in education/counseling sessions.    Expected Outcomes Short Term: Participant demonstrates changes in health-related behavior, relaxation and other stress management skills, ability to obtain effective social support, and compliance with psychotropic medications if prescribed.;Long Term: Emotional wellbeing is indicated by absence of clinically significant psychosocial distress or social isolation.             Core Components/Risk Factors/Patient Goals Review:   Goals and Risk Factor Review     Row Name 02/10/22 0946 02/28/22 1526 03/23/22 0831         Core Components/Risk Factors/Patient Goals Review   Personal Goals Review Weight Management/Obesity;Hypertension;Lipids Weight Management/Obesity;Hypertension;Lipids Weight Management/Obesity;Hypertension;Lipids     Review Tina Juarez started intensive cardiac rehab on 02/09/22 and did well with exercise, vital signs were stable Tina Juarez started intensive cardiac rehab on 02/09/22 and did well with exercise, vital signs were stable. Tina Juarez did report feeling lightheaded today after exercise. orthostatic BP's checked, WNL. Encouraged to hydrate adequately prior to coming to exercise at cardiac rehab Tina Juarez has good attendance and participation in intensive cardiac rehab. Tina Juarez reports feeling stronger. Vital signs have been stable     Expected Outcomes Tina Juarez will continue to participate in intensive cardiac rehab for exercise, nutrition and lifestyle modifications Tina Juarez will continue to participate in  intensive cardiac rehab for exercise, nutrition and lifestyle modifications Tina Juarez will continue to participate in intensive cardiac rehab for exercise, nutrition and lifestyle modifications              Core Components/Risk Factors/Patient Goals at Discharge (Final Review):   Goals and Risk Factor Review - 03/23/22 0831       Core Components/Risk Factors/Patient Goals Review   Personal Goals Review Weight Management/Obesity;Hypertension;Lipids    Review Tina Juarez has good attendance and participation in intensive cardiac rehab. Tina Juarez reports feeling stronger. 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 Intorduction to [place] education program/ intensive cardiac rehab. Initial orientation packet reviewed with patient. 30 Day ITP Review. Tina Juarez started intensive cardiac rehab on 02/09/22 and did well with exercise. 30 Day ITP Review. Tina Juarez has good attendance and participation in  intensive cardiac rehab. Tina Juarez has swtiched to the 0800 AM class to better accomodate her schedule 30 Day ITP Review. Tina Juarez has good attendance and participation in  intensive cardiac rehab. Tina Juarez will complete cardiac rehab at the end of the month             Comments: See ITP comments.Harrell Gave RN BSN

## 2022-03-25 ENCOUNTER — Encounter (HOSPITAL_COMMUNITY)
Admission: RE | Admit: 2022-03-25 | Discharge: 2022-03-25 | Disposition: A | Discharge status: Home / Self Care | Payer: BC Managed Care – PPO | Source: Ambulatory Visit | Attending: Cardiovascular Disease | Admitting: Cardiovascular Disease

## 2022-03-25 ENCOUNTER — Encounter: Payer: Self-pay | Admitting: Cardiovascular Disease

## 2022-03-25 ENCOUNTER — Other Ambulatory Visit (HOSPITAL_COMMUNITY): Payer: BC Managed Care – PPO

## 2022-03-25 ENCOUNTER — Encounter (HOSPITAL_COMMUNITY): Payer: BC Managed Care – PPO

## 2022-03-25 ENCOUNTER — Ambulatory Visit: Payer: BC Managed Care – PPO | Attending: Cardiovascular Disease

## 2022-03-25 DIAGNOSIS — I2542 Coronary artery dissection: Secondary | ICD-10-CM

## 2022-03-25 DIAGNOSIS — I214 Non-ST elevation (NSTEMI) myocardial infarction: Secondary | ICD-10-CM

## 2022-03-25 DIAGNOSIS — I252 Old myocardial infarction: Secondary | ICD-10-CM | POA: Diagnosis not present

## 2022-03-26 LAB — HEPATIC FUNCTION PANEL
ALT: 16 IU/L (ref 0–32)
AST: 18 IU/L (ref 0–40)
Albumin: 4.3 g/dL (ref 3.8–4.9)
Alkaline Phosphatase: 94 IU/L (ref 44–121)
Bilirubin Total: 0.4 mg/dL (ref 0.0–1.2)
Bilirubin, Direct: 0.11 mg/dL (ref 0.00–0.40)
Total Protein: 7.1 g/dL (ref 6.0–8.5)

## 2022-03-26 LAB — LIPID PANEL
Chol/HDL Ratio: 2.2 ratio (ref 0.0–4.4)
Cholesterol, Total: 127 mg/dL (ref 100–199)
HDL: 57 mg/dL (ref >39)
LDL Chol Calc (NIH): 57 mg/dL (ref 0–99)
Triglycerides: 61 mg/dL (ref 0–149)
VLDL Cholesterol Cal: 13 mg/dL (ref 5–40)

## 2022-03-28 ENCOUNTER — Encounter (HOSPITAL_COMMUNITY)
Admission: RE | Admit: 2022-03-28 | Discharge: 2022-03-28 | Disposition: A | Discharge status: Home / Self Care | Payer: BC Managed Care – PPO | Source: Ambulatory Visit | Attending: Cardiovascular Disease | Admitting: Cardiovascular Disease

## 2022-03-28 ENCOUNTER — Encounter (HOSPITAL_COMMUNITY): Payer: BC Managed Care – PPO

## 2022-03-28 DIAGNOSIS — I252 Old myocardial infarction: Secondary | ICD-10-CM | POA: Diagnosis not present

## 2022-03-28 DIAGNOSIS — I214 Non-ST elevation (NSTEMI) myocardial infarction: Secondary | ICD-10-CM

## 2022-03-30 ENCOUNTER — Encounter (HOSPITAL_COMMUNITY): Payer: BC Managed Care – PPO

## 2022-03-30 ENCOUNTER — Encounter (HOSPITAL_COMMUNITY)
Admission: RE | Admit: 2022-03-30 | Discharge: 2022-03-30 | Disposition: A | Discharge status: Home / Self Care | Payer: BC Managed Care – PPO | Source: Ambulatory Visit | Attending: Cardiovascular Disease | Admitting: Cardiovascular Disease

## 2022-03-30 DIAGNOSIS — I252 Old myocardial infarction: Secondary | ICD-10-CM | POA: Diagnosis not present

## 2022-03-30 DIAGNOSIS — I214 Non-ST elevation (NSTEMI) myocardial infarction: Secondary | ICD-10-CM

## 2022-03-31 ENCOUNTER — Ambulatory Visit (HOSPITAL_COMMUNITY): Payer: BC Managed Care – PPO | Attending: Cardiovascular Disease

## 2022-03-31 DIAGNOSIS — I2542 Coronary artery dissection: Secondary | ICD-10-CM | POA: Diagnosis not present

## 2022-03-31 DIAGNOSIS — I214 Non-ST elevation (NSTEMI) myocardial infarction: Secondary | ICD-10-CM

## 2022-03-31 LAB — ECHOCARDIOGRAM COMPLETE
Area-P 1/2: 3.7 cm2
S' Lateral: 3.1 cm

## 2022-03-31 MED ORDER — PERFLUTREN LIPID MICROSPHERE
1.0000 mL | INTRAVENOUS | Status: AC | PRN
  Administered 2022-03-31: 1 mL via INTRAVENOUS

## 2022-04-01 ENCOUNTER — Encounter (HOSPITAL_COMMUNITY): Payer: BC Managed Care – PPO

## 2022-04-04 ENCOUNTER — Encounter (HOSPITAL_COMMUNITY)
Admission: RE | Admit: 2022-04-04 | Discharge: 2022-04-04 | Disposition: A | Discharge status: Home / Self Care | Payer: BC Managed Care – PPO | Source: Ambulatory Visit | Attending: Cardiovascular Disease | Admitting: Cardiovascular Disease

## 2022-04-04 DIAGNOSIS — I214 Non-ST elevation (NSTEMI) myocardial infarction: Secondary | ICD-10-CM | POA: Insufficient documentation

## 2022-04-06 ENCOUNTER — Encounter (HOSPITAL_COMMUNITY)
Admission: RE | Admit: 2022-04-06 | Discharge: 2022-04-06 | Disposition: A | Discharge status: Home / Self Care | Payer: BC Managed Care – PPO | Source: Ambulatory Visit | Attending: Cardiovascular Disease | Admitting: Cardiovascular Disease

## 2022-04-06 VITALS — Ht 67.0 in | Wt 168.9 lb

## 2022-04-06 DIAGNOSIS — I214 Non-ST elevation (NSTEMI) myocardial infarction: Secondary | ICD-10-CM | POA: Diagnosis not present

## 2022-04-08 ENCOUNTER — Encounter (HOSPITAL_COMMUNITY)
Admission: RE | Admit: 2022-04-08 | Discharge: 2022-04-08 | Disposition: A | Discharge status: Home / Self Care | Payer: BC Managed Care – PPO | Source: Ambulatory Visit | Attending: Cardiovascular Disease | Admitting: Cardiovascular Disease

## 2022-04-08 DIAGNOSIS — I214 Non-ST elevation (NSTEMI) myocardial infarction: Secondary | ICD-10-CM

## 2022-04-08 NOTE — Progress Notes (Signed)
Discharge Progress Report  Patient Details  Name: Tina Juarez MRN: [id number] Date of Birth: February 29, 1965 Referring Provider:   Flowsheet Row INTENSIVE CARDIAC REHAB ORIENT [facility]20[facility]  Referring Provider Murrell ConverseMicheal Cooper, MD        Number of Visits: 39  Reason for Discharge:  Patient reached a stable level of exercise. Patient independent in their exercise. Patient has met program and personal goals.  Smoking History:  Social History   Tobacco Use  Smoking Status Never  Smokeless Tobacco Never    Diagnosis:  NSTEMI (non-ST elevated myocardial infarction)  ADL [facility]SD:   Initial Exercise Prescription:  Initial Exercise Prescription - 02/02/22 1300       Date of Initial Exercise RX and Referring Provider   Date 02/02/22    Referring Provider Murrell ConverseMicheal Cooper, MD    Expected Discharge Date 04/01/22      Arm Ergometer   Level 1.5    RPM 50    Minutes 15    METs 3      Recumbant Elliptical   Level 2    RPM 60    Minutes 15    METs 3      Prescription Details   Frequency (times per week) 3    Duration Progress to 30 minutes of continuous aerobic without signs/symptoms of physical distress      Intensity   THRR 40-80% of Max Heartrate 65-131    Ratings of Perceived Exertion 11-13    Perceived Dyspnea 0-4      Progression   Progression Continue to progress workloads to maintain intensity without signs/symptoms of physical distress.      Resistance Training   Training Prescription Yes    Weight 3    Reps 10-15             Discharge Exercise Prescription (Final Exercise Prescription Changes):  Exercise Prescription Changes - 04/08/22 1400       Response to Exercise   Blood Pressure (Admit) 104/68    Blood Pressure (Exercise) 112/70    Blood Pressure (Exit) 124/70    Heart Rate (Admit) 61 bpm    Heart Rate (Exercise) 107 bpm    Heart Rate (Exit) 79 bpm     Rating of Perceived Exertion (Exercise) 11    Symptoms None    Comments Pt graduated from the cRP2 program today    Duration Continue with 30 min of aerobic exercise without signs/symptoms of physical distress.    Intensity THRR unchanged      Progression   Progression Continue to progress workloads to maintain intensity without signs/symptoms of physical distress.    Average METs 3.4      Resistance Training   Training Prescription Yes    Weight 3 lbs    Reps 10-15    Time 10 Minutes      Interval Training   Interval Training No      Arm Ergometer   Level 2    Watts 18    RPM 59    Minutes 15    METs 2.7      Recumbant Elliptical   Level 4    Watts 82    Minutes 15    METs 4.1      Home Exercise Plan   Plans to continue exercise at Home (comment)    Frequency Add 4 additional days to program exercise sessions.    Initial Home Exercises Provided 03/16/22  Functional Capacity:  6 Minute Walk     Row Name 02/02/22 1328 04/04/22 0923       6 Minute Walk   Phase Initial Discharge    Distance 1440 feet 1736 feet    Distance % Change -- 20.56 %    Distance Feet Change -- 296 ft    Walk Time 6 minutes 6 minutes    # of Rest Breaks 0 0    MPH 2.72 3.3    METS 3.68 4.35    RPE 10 12    Perceived Dyspnea  0 0    VO2 Peak 12.98 15.15    Symptoms Yes (comment) No    Comments felt palpatations with PVCs, dizziness at end of test. no pain. --    Resting HR 75 bpm 67 bpm    Resting BP 104/70 104/64    Resting Oxygen Saturation  98 % --    Exercise Oxygen Saturation  during 6 min walk 99 % --    Max Ex. HR 95 bpm 102 bpm    Max Ex. BP 108/76 116/76    2 Minute Post BP 92/68  BP recheck with orthostatics. sitting: 94/72. standing: 98/76 98/64             Psychological, QOL, Others - Outcomes: PHQ 2/9:    04/08/2022    9:52 AM 02/14/2022   11:18 AM 02/02/2022    1:27 PM 11/29/2021    3:19 PM 10/22/2021    1:57 PM  Depression screen PHQ 2/9   Decreased Interest 0 0 0 0 0  Down, Depressed, Hopeless 0 0 1 0 0  PHQ - 2 Score 0 0 1 0 0  Altered sleeping 1 2 2  2   Tired, decreased energy 1 1 1  1   Change in appetite 0 0 1  0  Feeling bad or failure about yourself  0 0 1  1  Trouble concentrating 0 1 0  0  Moving slowly or fidgety/restless 0 0 0  0  Suicidal thoughts 0 0 0  0  PHQ-9 Score 2 4 6  4   Difficult doing work/chores  Somewhat difficult Somewhat difficult  Not difficult at all    Quality of Life:  Quality of Life - 04/04/22 0838       Quality of Life   Select Quality of Life      Quality of Life Scores   Health/Function Pre 21.1 %    Health/Function Post 27.47 %    Health/Function % Change 30.19 %    Socioeconomic Pre 26.43 %    Socioeconomic Post 28.43 %    Socioeconomic % Change  7.57 %    Psych/Spiritual Pre 25.75 %    Psych/Spiritual Post 26.5 %    Psych/Spiritual % Change 2.91 %    Family Pre 27.3 %    Family Post 30 %    Family % Change 9.89 %    GLOBAL Pre 24.02 %    GLOBAL Post 27.84 %    GLOBAL % Change 15.9 %             Personal Goals: Goals established at orientation with interventions provided to work toward goal.  Personal Goals and Risk Factors at Admission - 02/02/22 1340       Core Components/Risk Factors/Patient Goals on Admission    Weight Management Yes;Weight Loss    Intervention Weight Management: Develop a combined nutrition and exercise program designed to reach desired caloric intake, while maintaining  appropriate intake of nutrient and fiber, sodium and fats, and appropriate energy expenditure required for the weight goal.;Weight Management: Provide education and appropriate resources to help participant work on and attain dietary goals.    Goal Weight: Short Term 160 lb (72.6 kg)    Expected Outcomes Short Term: Continue to assess and modify interventions until short term weight is achieved;Long Term: Adherence to nutrition and physical activity/exercise program aimed  toward attainment of established weight goal;Weight Loss: Understanding of general recommendations for a balanced deficit meal plan, which promotes 1-2 lb weight loss per week and includes a negative energy balance of [phone] kcal/d;Understanding recommendations for meals to include 15-35% energy as protein, 25-35% energy from fat, 35-60% energy from carbohydrates, less than  of dietary cholesterol, 20-35 gm of total fiber daily;Understanding of distribution of calorie intake throughout the day with the consumption of 4-5 meals/snacks    Hypertension Yes    Intervention Monitor prescription use compliance.;Provide education on lifestyle modifcations including regular physical activity/exercise, weight management, moderate sodium restriction and increased consumption of fresh fruit, vegetables, and low fat dairy, alcohol moderation, and smoking cessation.    Expected Outcomes Long Term: Maintenance of blood pressure at goal levels.;Short Term: Continued assessment and intervention until BP is < 140/26mm HG in hypertensive participants. < 130/35mm HG in hypertensive participants with diabetes, heart failure or chronic kidney disease.    Lipids Yes    Intervention Provide education and support for participant on nutrition & aerobic/resistive exercise along with prescribed medications to achieve LDL 70mg , HDL >40mg .    Expected Outcomes Long Term: Cholesterol controlled with medications as prescribed, with individualized exercise RX and with personalized nutrition plan. Value goals: LDL < , HDL > 40 mg.;Short Term: Participant states understanding of desired cholesterol values and is compliant with medications prescribed. Participant is following exercise prescription and nutrition guidelines.    Stress Yes    Intervention Offer individual and/or small group education and counseling on adjustment to heart disease, stress management and health-related lifestyle change. Teach and support self-help  strategies.;Refer participants experiencing significant psychosocial distress to appropriate mental health specialists for further evaluation and treatment. When possible, include family members and significant others in education/counseling sessions.    Expected Outcomes Short Term: Participant demonstrates changes in health-related behavior, relaxation and other stress management skills, ability to obtain effective social support, and compliance with psychotropic medications if prescribed.;Long Term: Emotional wellbeing is indicated by absence of clinically significant psychosocial distress or social isolation.              Personal Goals Discharge:  Goals and Risk Factor Review     Row Name 02/10/22 0946 02/28/22 1526 03/23/22 0831         Core Components/Risk Factors/Patient Goals Review   Personal Goals Review Weight Management/Obesity;Hypertension;Lipids Weight Management/Obesity;Hypertension;Lipids Weight Management/Obesity;Hypertension;Lipids     Review Tina Juarez started intensive cardiac rehab on 02/09/22 and did well with exercise, vital signs were stable Tina Juarez started intensive cardiac rehab on 02/09/22 and did well with exercise, vital signs were stable. Tina Juarez did report feeling lightheaded today after exercise. orthostatic BP's checked, WNL. Encouraged to hydrate adequately prior to coming to exercise at cardiac rehab Tina Juarez has good attendance and participation in intensive cardiac rehab. Tina Juarez reports feeling stronger. Vital signs have been stable     Expected Outcomes Tina Juarez will continue to participate in intensive cardiac rehab for exercise, nutrition and lifestyle modifications Tina Juarez will continue to participate in intensive cardiac rehab for exercise, nutrition and lifestyle modifications Tina Juarez will  continue to participate in intensive cardiac rehab for exercise, nutrition and lifestyle modifications              Exercise Goals and Review:  Exercise Goals     Row Name 02/02/22  1331             Exercise Goals   Increase Physical Activity Yes       Intervention Provide advice, education, support and counseling about physical activity/exercise needs.;Develop an individualized exercise prescription for aerobic and resistive training based on initial evaluation findings, risk stratification, comorbidities and participant's personal goals.       Expected Outcomes Short Term: Attend rehab on a regular basis to increase amount of physical activity.;Long Term: Exercising regularly at least 3-5 days a week.;Long Term: Add in home exercise to make exercise part of routine and to increase amount of physical activity.       Increase Strength and Stamina Yes       Intervention Provide advice, education, support and counseling about physical activity/exercise needs.;Develop an individualized exercise prescription for aerobic and resistive training based on initial evaluation findings, risk stratification, comorbidities and participant's personal goals.       Expected Outcomes Short Term: Increase workloads from initial exercise prescription for resistance, speed, and METs.;Short Term: Perform resistance training exercises routinely during rehab and add in resistance training at home;Long Term: Improve cardiorespiratory fitness, muscular endurance and strength as measured by increased METs and functional capacity ( )       Able to understand and use rate of perceived exertion (RPE) scale Yes       Intervention Provide education and explanation on how to use RPE scale       Expected Outcomes Short Term: Able to use RPE daily in rehab to express subjective intensity level;Long Term:  Able to use RPE to guide intensity level when exercising independently       Knowledge and understanding of Target Heart Rate Range (THRR) Yes       Intervention Provide education and explanation of THRR including how the numbers were predicted and where they are located for reference       Expected  Outcomes Short Term: Able to state/look up THRR;Long Term: Able to use THRR to govern intensity when exercising independently;Short Term: Able to use daily as guideline for intensity in rehab       Understanding of Exercise Prescription Yes       Intervention Provide education, explanation, and written materials on patient's individual exercise prescription       Expected Outcomes Short Term: Able to explain program exercise prescription;Long Term: Able to explain home exercise prescription to exercise independently                Exercise Goals Re-Evaluation:  Exercise Goals Re-Evaluation     Row Name 02/09/22 1428 03/14/22 1216 04/08/22 1437         Exercise Goal Re-Evaluation   Exercise Goals Review Increase Physical Activity;Able to understand and use rate of perceived exertion (RPE) scale;Increase Strength and Stamina;Knowledge and understanding of Target Heart Rate Range (THRR);Understanding of Exercise Prescription Increase Physical Activity;Able to understand and use rate of perceived exertion (RPE) scale;Increase Strength and Stamina;Knowledge and understanding of Target Heart Rate Range (THRR);Understanding of Exercise Prescription Increase Physical Activity;Able to understand and use rate of perceived exertion (RPE) scale;Increase Strength and Stamina;Knowledge and understanding of Target Heart Rate Range (THRR);Understanding of Exercise Prescription     Comments Pt's first day in the CRP2 program. Pt  understands the exercise Rx, RPE scale and THRR Reviewed METs and goals. Pt is making progress. Feels stonger. Pt has much less anxiety with exercise. Pt graduated from the CRP2 program today. Pt made good progress and had peak METs of 6.0. Pt plans to continue her exercise at home by walking and doing pilates. Pt has been able to reduce her anxiety level with exercise. She has increased her strength and stamina.     Expected Outcomes Will continue to monitor patient and progress  exercise workloads as tolerated. Will continue to monitor patient and progress exercise workloads as tolerated. Pt will continue to exercise at home.              Nutrition & Weight - Outcomes:  Pre Biometrics - 02/02/22 1325       Pre Biometrics   Waist Circumference 35.25 inches    Hip Circumference 43 inches    Waist to Hip Ratio 0.82 %    Triceps Skinfold 38 mm    % Body Fat 39 %    Grip Strength 34 kg    Flexibility 18.5 in    Single Leg Stand 30 seconds             Post Biometrics - 04/06/22 0826        Post  Biometrics   Height 5\' 7"  (1.702 m)    Weight 76.6 kg    Waist Circumference 35.25 inches    Hip Circumference 43 inches    Waist to Hip Ratio 0.82 %    BMI (Calculated) 26.44    Triceps Skinfold 36 mm    % Body Fat 38.6 %    Grip Strength 38 kg    Flexibility 20 in    Single Leg Stand 30 seconds             Nutrition:  Nutrition Therapy & Goals - 04/04/22 0943       Nutrition Therapy   Diet Heart Healthy Diet    Drug/Food Interactions Statins/Certain Fruits      Personal Nutrition Goals   Nutrition Goal Patient to identify strategies for reducing cardiovascular risk by attending the weekly [employer] education and nutrition series    Personal Goal #2 Patient to improve diet quality by using the plate method as a daily guide for meal planning to include lean protein/plant protein, fruits, vegetables, whole grains, nonfat dairy as part of well balanced diet    Personal Goal #3 Patient to identify strategies for weight loss of 0.5-2.0# per week.    Comments Goals in action. Tina Juarez continues to attend the [employer] and nutrition session. She has implemented many plant based protein sources and high fiber foods. We have discussed strategies for weight loss including calorie density, benefits of high fiber/high protein intake, carbohydrate reducation, etc. She has not met weight loss goals.  Tina Juarez will continue to benefit from particiation in  intensive cardiac rehab for nutrition, exercise, and lifestyle modification.      Intervention Plan   Intervention Prescribe, educate and counsel regarding individualized specific dietary modifications aiming towards targeted core components such as weight, hypertension, lipid management, diabetes, heart failure and other comorbidities.;Nutrition handout(s) given to patient.    Expected Outcomes Short Term Goal: Understand basic principles of dietary content, such as calories, fat, sodium, cholesterol and nutrients.;Long Term Goal: Adherence to prescribed nutrition plan.             Nutrition Discharge:  Nutrition Assessments - 04/12/22 1500  Rate Your Plate Scores   Post Score 83             Education Questionnaire Score:  Knowledge Questionnaire Score - 04/04/22 0842       Knowledge Questionnaire Score   Post Score 23/24             Goals reviewed with patient; copy given to patient.Pt graduates from  Intensive cardiac rehab program on 04/08/22  with completion of  21 exercise and  15 education sessions. Pt maintained good attendance and progressed nicely during their participation in rehab as evidenced by increased MET level. Tina Juarez increased her distance on her post exercise wlk test by 296 feet. Tina Juarez lost 1.3 kg.  Medication list reconciled. Repeat  PHQ score- 2 .  Pt has made significant lifestyle changes and should be commended for their success. Tina Juarez  achieved their goals during cardiac rehab.   Pt plans to continue exercise at by walking and doing pilate's. Tina Juarez says participating in cardiac rehab has given her more confidence to exercise independently. Tina Juarez says she is feeling better all around. We are proud of Lisa's progress!Thayer Headings RN BSN

## 2022-04-11 ENCOUNTER — Encounter: Payer: Self-pay | Admitting: Cardiovascular Disease

## 2022-04-11 ENCOUNTER — Ambulatory Visit: Payer: BC Managed Care – PPO | Attending: Cardiovascular Disease | Admitting: Cardiovascular Disease

## 2022-04-11 VITALS — BP 120/80 | HR 65 | Ht 67.0 in | Wt 165.2 lb

## 2022-04-11 DIAGNOSIS — E782 Mixed hyperlipidemia: Secondary | ICD-10-CM | POA: Diagnosis not present

## 2022-04-11 DIAGNOSIS — I2542 Coronary artery dissection: Secondary | ICD-10-CM

## 2022-04-11 NOTE — Patient Instructions (Signed)
Medication Instructions:  Your physician recommends that you continue on your current medications as directed. Please refer to the Current Medication list given to you today.  *If you need a refill on your cardiac medications before your next appointment, please call your pharmacy*   Lab Work: NONE If you have labs (blood work) drawn today and your tests are completely normal, you will receive your results only by: MyChart Message (if you have MyChart) OR A paper copy in the mail If you have any lab test that is abnormal or we need to change your treatment, we will call you to review the results.   Testing/Procedures: NONE   Follow-Up: At [facility], you and your health needs are our priority.  As part of our continuing mission to provide you with exceptional heart care, we have created designated Provider Care Teams.  These Care Teams include your primary Cardiologist (physician) and Advanced Practice Providers (APPs -  Physician Assistants and Nurse Practitioners) who all work together to provide you with the care you need, when you need it.  We recommend signing up for the patient portal called "MyChart".  Sign up information is provided on this After Visit Summary.  MyChart is used to connect with patients for Virtual Visits (Telemedicine).  Patients are able to view lab/test results, encounter notes, upcoming appointments, etc.  Non-urgent messages can be sent to your provider as well.   To learn more about what you can do with MyChart, go to [email or web address].    Your next appointment:   6 month(s)  Provider:   Michael Cooper, MD   

## 2022-04-11 NOTE — Progress Notes (Signed)
Cardiology Office Note:    Date:  04/11/2022   ID:  Tina Juarez, DOB [birth year], MRN [id number]  PCP:  Nelwyn SalisburyFry, Stephen A, MD   [facility] Providers Cardiologist:  Tonny BollmanMichael Cinda Hara, MD Cardiology APP:  Kennon RoundsWeaver, Scott T, PA-C     Referring MD: Nelwyn SalisburyFry, Stephen A, MD   Chief Complaint  Patient presents with   Follow-up    SCAD    History of Present Illness:    Tina Juarez is a 57 y.o. female presenting for follow-up of SCAD.  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  She has completed cardiac rehab.  She had a follow-up echocardiogram recently and this demonstrated complete normalization of her LV function.  To summarize her most recent cardiac event, she presented with non-STEMI with peak high-sensitivity troponin of 1760.  Cardiac catheterization demonstrated distal vessel tapering of the LAD and posterolateral branches of the RCA suggestive of spontaneous coronary dissection with intramural hematoma.  The proximal coronary arteries were angiographically normal.  She is doing well and starting to get back in the low-level exercise.  She does complain of some generalized fatigue but has no other specific complaints.  She denies chest pain, chest pressure, or shortness of breath.  She has occasional heart palpitations.  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 Leone PayorGessner, benign polyps, repeat in 10 yrs    LEFT HEART CATH AND CORONARY ANGIOGRAPHY N/A 12/20/2021   Procedure: LEFT HEART CATH AND CORONARY ANGIOGRAPHY;  Surgeon: Yvonne KendallEnd, Christopher, MD;  [facility]: [facility];  Service: Cardiovascular;  Laterality: N/A;    Current Medications: Current Meds  Medication Sig   acetaminophen (TYLENOL) 500 MG tablet Take 1,000 mg by mouth every 6 (six) hours as needed for mild pain.   aspirin 81 MG tablet Take 1 tablet (81 mg total) by mouth daily.   carvedilol (COREG) 3.125 MG tablet Take 1 tablet (3.125 mg total) by mouth 2 (two) times daily with a meal.   melatonin 1 MG TABS tablet Take 2 mg by mouth at bedtime as needed (sleep).   nitroGLYCERIN (NITROSTAT) 0.4 MG SL tablet Place 1 tablet (0.4 mg total) under the tongue every 5 (five) minutes x 3 doses as needed for chest pain.   rosuvastatin (CRESTOR) 20 MG tablet Take 1 tablet (20 mg total) by mouth daily.   traZODone (DESYREL) 50 MG tablet Take 1 tablet (50 mg total) by mouth at bedtime as needed for sleep.     Allergies:   Patient has no known allergies.   Social History   Socioeconomic History   Marital status: Married    Spouse name: Not on file   Number of  children: Not on file   Years of education: Not on file   Highest education level: Not on file  Occupational History   Not on file  Tobacco Use   Smoking status: Never   Smokeless tobacco: Never  Vaping Use   Vaping Use: Never used  Substance and Sexual Activity   Alcohol use: Yes    Comment: weekly - 5 a week    Drug use: Never   Sexual activity: Yes    Birth control/protection: I.U.D.  Other Topics Concern   Not on file  Social History Narrative   Not on file   Social Determinants of Health   Financial Resource Strain: Not on file  Food Insecurity: No  Food Insecurity (12/20/2021)   Hunger Vital Sign    Worried About Running Out of Food in the Last Year: Never true    Ran Out of Food in the Last Year: Never true  Transportation Needs: No Transportation Needs (12/20/2021)   PRAPARE - Administrator, Civil Service (Medical): No    Lack of Transportation (Non-Medical): No  Physical Activity: Not on file  Stress: Not on file  Social Connections: Not on file     Family History: The patient's family history includes Breast cancer in her paternal grandmother; Cancer in her father; Hyperlipidemia in her father; Mental retardation in her mother; Prostate cancer in her father. There is no history of Colon polyps, Colon cancer, Esophageal cancer, Rectal cancer, or Stomach cancer.  ROS:   Please see the history of present illness.    All other systems reviewed and are negative.  EKGs/Labs/Other Studies Reviewed:    The following studies were reviewed today: Cardiac Studies & Procedures   CARDIAC CATHETERIZATION  CARDIAC CATHETERIZATION 12/20/2021  Narrative Conclusions: Irregular tapering of apical LAD and distal segment of 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  The large/proximal coronary arteries are without significant disease. Low normal left ventricular systolic function with apical wall motion abnormality (LVEF 50-55%). Normal left ventricular filling pressure.  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  Continue indefinite aspirin 81 mg daily; defer P2Y12 inhibitor and IV heparin in the setting of SCAD. Continue metoprolol, with dose escalation as heart rate and blood pressure allow.  Yvonne Kendall, MD [facility]  Findings Coronary Findings Diagnostic  Dominance: Right  Left Main Vessel is moderate in size. Vessel is angiographically normal.  Left Anterior Descending Dist LAD lesion is 75%  stenosed.  First Diagonal Branch Vessel is moderate in size.  Second Diagonal Branch Vessel is small in size.  Ramus Intermedius Vessel is small. Vessel is angiographically normal.  Left Circumflex Vessel is moderate in size.  First Obtuse Marginal Branch Vessel is small in size.  Second Obtuse Marginal Branch Vessel is small in size.  Left Posterior Atrioventricular Artery Vessel is small in size.  Right Coronary Artery Vessel is large. Vessel is angiographically normal.  Right Posterior Descending Artery Vessel is moderate in size.  Right Posterior Atrioventricular Artery Vessel is large in size.  First Right Posterolateral Branch Vessel is small in size.  Second Right Posterolateral Branch Vessel is moderate in size. 2nd RPL lesion is 50% stenosed.  Third Right Posterolateral Branch Vessel is small in size.  Intervention  No interventions have been documented.     ECHOCARDIOGRAM  ECHOCARDIOGRAM COMPLETE 03/31/2022  Narrative ECHOCARDIOGRAM REPORT    Patient Name:   GLADINE PLUDE [facility] Date of  Exam: 03/31/2022 Medical Rec #:  [id number]               Height:       67.0 in Accession #:    [id number]              Weight:       170.0 lb Date of Birth:  1965/04/13               BSA:          1.887 m Patient Age:    56 years                BP:           124/80 mmHg Patient Gender: F                       HR:           65 bpm. Exam Location:  [address]  Procedure: 2D Echo, Cardiac Doppler, Color Doppler and Intracardiac Opacification Agent  Indications:    I25.42 Spontaneous dissection of coronary artery  History:        Patient has prior history of Echocardiogram examinations, most recent 12/20/2021. Previous Myocardial Infarction and CAD, Abnormal ECG, Signs/Symptoms:Chest Pain; Risk Factors:Dyslipidemia.  Sonographer:    Cathie Beams RCS Referring Phys: [phone] Mylie Mccurley  IMPRESSIONS   1. Left ventricular ejection fraction, by  estimation, is 60 to 65%. The left ventricle has normal function. The left ventricle has no regional wall motion abnormalities. Left ventricular diastolic parameters are consistent with Grade I diastolic dysfunction (impaired relaxation). 2. Right ventricular systolic function is normal. The right ventricular size is normal. 3. No evidence of mitral valve regurgitation. 4. Aortic valve regurgitation is not visualized. 5. The inferior vena cava is normal in size with greater than 50% respiratory variability, suggesting right atrial pressure of 3 mmHg.  FINDINGS Left Ventricle: Left ventricular ejection fraction, by estimation, is 60 to 65%. The left ventricle has normal function. The left ventricle has no regional wall motion abnormalities. The left ventricular internal cavity size was normal in size. There is no left ventricular hypertrophy. Left ventricular diastolic parameters are consistent with Grade I diastolic dysfunction (impaired relaxation).  Right Ventricle: The right ventricular size is normal. Right ventricular systolic function is normal.  Left [facility]: Left atrial size was normal in size.  Right [facility]: Right atrial size was normal in size.  Pericardium: There is no evidence of pericardial effusion.  Mitral Valve: No evidence of mitral valve regurgitation.  Tricuspid Valve: Tricuspid valve regurgitation is trivial.  Aortic Valve: Aortic valve regurgitation is not visualized.  Pulmonic Valve: Pulmonic valve regurgitation is not visualized.  Aorta: The aortic root and ascending aorta are structurally normal, with no evidence of dilitation.  Venous: The inferior vena cava is normal in size with greater than 50% respiratory variability, suggesting right atrial pressure of 3 mmHg.  IAS/Shunts: No atrial level shunt detected by color flow Doppler.   LEFT VENTRICLE PLAX 2D LVIDd:         4.90 cm   Diastology LVIDs:         3.10 cm   LV e' medial:    7.07 cm/s LV PW:          0.60 cm   LV E/e' medial:  10.7 LV IVS:        0.70 cm   LV e' lateral:   6.74 cm/s LVOT diam:  1.90 cm   LV E/e' lateral: 11.2 LV SV:         71 LV SV Index:   38 LVOT Area:     2.84 cm   RIGHT VENTRICLE RV Basal diam:  2.70 cm RV Mid diam:    1.80 cm RV S prime:     12.00 cm/s TAPSE (M-mode): 2.0 cm RVSP:           15.1 mmHg  LEFT ATRIUM             Index        RIGHT ATRIUM           Index LA diam:        3.50 cm 1.85 cm/m   RA Pressure: 3.00 mmHg LA Vol (A2C):   33.7 ml 17.86 ml/m  RA Area:     13.30 cm LA Vol (A4C):   37.5 ml 19.87 ml/m  RA Volume:   30.70 ml  16.27 ml/m LA Biplane Vol: 36.8 ml 19.50 ml/m AORTIC VALVE LVOT Vmax:   116.00 cm/s LVOT Vmean:  68.900 cm/s LVOT VTI:    0.251 m  AORTA Ao Root diam: 3.10 cm Ao Asc diam:  3.40 cm  MITRAL VALVE               TRICUSPID VALVE MV Area (PHT): 3.70 cm    TR Peak grad:   12.1 mmHg MV Decel Time: 205 msec    TR Vmax:        174.00 cm/s MV E velocity: 75.80 cm/s  Estimated RAP:  3.00 mmHg MV A velocity: 84.80 cm/s  RVSP:           15.1 mmHg MV E/A ratio:  0.89 SHUNTS Systemic VTI:  0.25 m Systemic Diam: 1.90 cm  Carolan Clines Electronically signed by Carolan Clines Signature Date/Time: 03/31/2022/3:10:28 PM    Final    MONITORS  CARDIAC EVENT MONITOR 06/15/2020  Narrative 1. The basic rhythm is normal sinus with an average HR of 72 bpm 2. No atrial fibrillation or flutter 3. No high-grade heart block or pathologic pauses 4. There are rare PVC's and rare supraventricular beats without sustained arrhythmias. Few short supraventricular runs noted (3-4 beats)  Overall benign monitor result            EKG:  EKG is not ordered today.    Recent Labs: 12/20/2021: TSH 0.962 12/21/2021: BUN 12; Creatinine, Ser 0.77; Hemoglobin 12.8; Platelets 222; Potassium 3.9; Sodium 136 03/25/2022: ALT 16  Recent Lipid Panel    Component Value Date/Time   CHOL 127 03/25/2022 0804   TRIG 61 03/25/2022 0804    HDL 57 03/25/2022 0804   CHOLHDL 2.2 03/25/2022 0804   CHOLHDL 3.0 12/20/2021 0451   VLDL 6 12/20/2021 0451   LDLCALC 57 03/25/2022 0804     Risk Assessment/Calculations:                Physical Exam:    VS:  BP 120/80   Pulse 65   Ht 5\' 7"  (1.702 m)   Wt 165 lb 3.2 oz (74.9 kg)   SpO2 99%   BMI 25.87 kg/m     Wt Readings from Last 3 Encounters:  04/11/22 165 lb 3.2 oz (74.9 kg)  04/06/22 168 lb 14 oz (76.6 kg)  02/14/22 170 lb (77.1 kg)     GEN:  Well nourished, well developed in no acute distress HEENT: Normal NECK: No JVD; No carotid bruits LYMPHATICS: No lymphadenopathy CARDIAC: RRR,  no murmurs, rubs, gallops RESPIRATORY:  Clear to auscultation without rales, wheezing or rhonchi  ABDOMEN: Soft, non-tender, non-distended MUSCULOSKELETAL:  No edema; No deformity  SKIN: Warm and dry NEUROLOGIC:  Alert and oriented x 3 PSYCHIATRIC:  Normal affect   ASSESSMENT:    1. Spontaneous dissection of coronary artery   2. Mixed hyperlipidemia    PLAN:    In order of problems listed above:  The patient appears to be doing well.  Her current regimen includes aspirin 81 mg for antiplatelet therapy, carvedilol at low-dose for recovery after her MI, and rosuvastatin 20 mg daily in the setting of mixed hyperlipidemia.  She has not been on DAPT as this has been associated with worse outcomes after SCAD. I personally reviewed her recent echocardiogram which shows normal LVEF of 60 to 65% and no regional wall motion abnormalities.  We discussed specific recommendations for her exercise regimen including regular aerobic activity but avoiding heavy isometric lifting.  She has an upcoming consultation with [facility] in [place] to see subspecialist in the area of spontaneous coronary artery dissection.  It would be good to learn if there are any other medical therapies available to reduce her risk of recurrence.  Otherwise she is reassured about her current clinical course and I  will plan to see her back in 6 months unless problems arise in the interim. Recent lipids reviewed with cholesterol 127, HDL 57, triglycerides 61, LDL 57.  Transaminases are normal.  Continue rosuvastatin at the current dose.      Medication Adjustments/Labs and Tests Ordered: Current medicines are reviewed at length with the patient today.  Concerns regarding medicines are outlined above.  No orders of the defined types were placed in this encounter.  No orders of the defined types were placed in this encounter.   Patient Instructions  Medication Instructions:  Your physician recommends that you continue on your current medications as directed. Please refer to the Current Medication list given to you today.  *If you need a refill on your cardiac medications before your next appointment, please call your pharmacy*   Lab Work: NONE If you have labs (blood work) drawn today and your tests are completely normal, you will receive your results only by: MyChart Message (if you have MyChart) OR A paper copy in the mail If you have any lab test that is abnormal or we need to change your treatment, we will call you to review the results.   Testing/Procedures: NONE   Follow-Up: At [facility], you and your health needs are our priority.  As part of our continuing mission to provide you with exceptional heart care, we have created designated Provider Care Teams.  These Care Teams include your primary Cardiologist (physician) and Advanced Practice Providers (APPs -  Physician Assistants and Nurse Practitioners) who all work together to provide you with the care you need, when you need it.  We recommend signing up for the patient portal called "MyChart".  Sign up information is provided on this After Visit Summary.  MyChart is used to connect with patients for Virtual Visits (Telemedicine).  Patients are able to view lab/test results, encounter notes, upcoming appointments, etc.   Non-urgent messages can be sent to your provider as well.   To learn more about what you can do with MyChart, go to [email or web address].    Your next appointment:   6 month(s)  Provider:   Tonny Bollman, MD        Signed, Tonny Bollman, MD  04/11/2022 4:53 PM    [facility]

## 2022-04-12 ENCOUNTER — Ambulatory Visit: Payer: BC Managed Care – PPO | Admitting: Behavioral Health

## 2022-04-12 DIAGNOSIS — F418 Other specified anxiety disorders: Secondary | ICD-10-CM | POA: Diagnosis not present

## 2022-04-12 DIAGNOSIS — I214 Non-ST elevation (NSTEMI) myocardial infarction: Secondary | ICD-10-CM

## 2022-04-12 NOTE — Progress Notes (Signed)
                Keeleigh Terris L Miquel Lamson, LMFT 

## 2022-04-18 NOTE — Progress Notes (Signed)
[facility] Counselor/Therapist Progress Note  Patient ID: Tina Juarez, MRN: [id number],    Date: 04/18/2022  Time Spent: 55 min In Person @ [facility] Office   Treatment Type: Individual Therapy  Reported Symptoms: Elevated frustration & impatience w/self & her integration of healing btwn her mind & body  Mental Status Exam: Appearance:  Casual     Behavior: Appropriate and Sharing  Motor: Normal  Speech/Language:  Clear and Coherent and Normal Rate  Affect: Appropriate  Mood: normal  Thought process: normal  Thought content:   WNL  Sensory/Perceptual disturbances:   WNL  Orientation: oriented to person, place, and time/date  Attention: Good  Concentration: Good  Memory: WNL  Fund of knowledge:  Good  Insight:   Good  Judgment:  Good  Impulse Control: Good   Risk Assessment: Danger to Self:  No Self-injurious Behavior: No Danger to Others: No Duty to Warn:no Physical Aggression / Violence:No  Access to Firearms a concern: No  Gang Involvement:No   Subjective: Pt expresses her concern for her Mother & her recent trip to [facility] which exacerbated her concerns for her Mother's health status changes. Her Mother has them both in psychotherapy w/a Pastoral Cslr who is making a difference. She was asked what things she cannot negotiate on w/her Mother long distance. Pt sts her biggest concern is driving & food.   Interventions: Solution-Oriented/Positive Psychology and Insight-Oriented  Diagnosis:Anxiety with depression  NSTEMI (non-ST elevated myocardial infarction)  Plan: Abira is trying to assist her 58yo Mother from a distance w/her needs. It is causing sleep disruption. Carmencita will follow the tips for Sleep Hygiene provided today & we will discuss her progress next session.   Target Date: 05/18/2022  Progress: 5  Frequency: Once every 3-4 wks  Modality: Dianna Rossetti will cont psychotherapy w/her Mother & makes notes btwn our sessions.  She will explore the role of Caregiving for elders & see what she learns.  Target Date: 05/18/2022  Progress: 4  Frequency: Once every 3-4 wks  Modality:Indiv  Deneise Lever, LMFT

## 2022-05-03 ENCOUNTER — Ambulatory Visit: Payer: BC Managed Care – PPO | Admitting: Behavioral Health

## 2022-05-20 ENCOUNTER — Other Ambulatory Visit: Payer: Self-pay | Admitting: Family Medicine

## 2022-05-20 DIAGNOSIS — Z1231 Encounter for screening mammogram for malignant neoplasm of breast: Secondary | ICD-10-CM

## 2022-06-10 ENCOUNTER — Other Ambulatory Visit: Payer: Self-pay | Admitting: Family Medicine

## 2022-06-22 ENCOUNTER — Ambulatory Visit
Admission: RE | Admit: 2022-06-22 | Discharge: 2022-06-22 | Disposition: A | Discharge status: Home / Self Care | Payer: BC Managed Care – PPO | Source: Ambulatory Visit

## 2022-06-22 DIAGNOSIS — Z1231 Encounter for screening mammogram for malignant neoplasm of breast: Secondary | ICD-10-CM

## 2022-06-29 ENCOUNTER — Encounter: Payer: Self-pay | Admitting: Adult Health

## 2022-06-29 ENCOUNTER — Telehealth (INDEPENDENT_AMBULATORY_CARE_PROVIDER_SITE_OTHER): Payer: BC Managed Care – PPO | Admitting: Adult Health

## 2022-06-29 ENCOUNTER — Encounter: Payer: Self-pay | Admitting: Family Medicine

## 2022-06-29 VITALS — Temp 100.0°F | Ht 67.0 in | Wt 160.0 lb

## 2022-06-29 DIAGNOSIS — U071 COVID-19: Secondary | ICD-10-CM | POA: Diagnosis not present

## 2022-06-29 MED ORDER — MOLNUPIRAVIR EUA 200MG CAPSULE
4.0000 | ORAL_CAPSULE | Freq: Two times a day (BID) | ORAL | 0 refills | Status: AC
Start: 2022-06-29 — End: 2022-07-04

## 2022-06-29 NOTE — Progress Notes (Signed)
Virtual Visit via Video Note  I connected with Tina Juarez on 06/29/22 at 11:45 AM EDT by a video enabled telemedicine application and verified that I am speaking with the correct person using two identifiers.  Location patient: home Location provider:work or home office Persons participating in the virtual visit: patient, provider  I discussed the limitations of evaluation and management by telemedicine and the availability of in person appointments. The patient expressed understanding and agreed to proceed.   HPI: 57 year old female who  has a past medical history of Cataract, Myocardial infarction (HCC) (2017), and Spontaneous dissection of coronary artery (01/27/2015).  She tested positive for COVID 19 earlier today. Her symptoms started on . Her symptoms include that of congestion, body aches, chills, headaches, low grade fever and fatigue. She has never had Covid 19 before. She had an NSTEMI d/t spontaneous coronary artery dissection six months ago.    ROS: See pertinent positives and negatives per HPI.  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 Leone Payor, benign polyps, repeat in 10 yrs    LEFT HEART CATH AND CORONARY ANGIOGRAPHY N/A 12/20/2021   Procedure: LEFT HEART CATH AND CORONARY ANGIOGRAPHY;  Surgeon: Yvonne Kendall, MD;  Location: [facility];  Service: Cardiovascular;  Laterality: N/A;    Family History  Problem Relation Age of Onset   Mental retardation Mother    Cancer  Father    Hyperlipidemia Father    Prostate cancer Father    Breast cancer Paternal Grandmother        54s   Colon polyps Neg Hx    Colon cancer Neg Hx    Esophageal cancer Neg Hx    Rectal cancer Neg Hx    Stomach cancer Neg Hx        Current Outpatient Medications:    acetaminophen (TYLENOL) 500 MG tablet, Take 1,000 mg by mouth every 6 (six) hours as needed for mild pain., Disp: , Rfl:    aspirin 81 MG tablet, Take 1 tablet (81 mg total) by mouth daily., Disp: 30 tablet, Rfl: 0   carvedilol (COREG) 3.125 MG tablet, Take 1 tablet (3.125 mg total) by mouth 2 (two) times daily with a meal., Disp: 180 tablet, Rfl: 3   melatonin 1 MG TABS tablet, Take 2 mg by mouth at bedtime as needed (sleep)., Disp: , Rfl:    nitroGLYCERIN (NITROSTAT) 0.4 MG SL tablet, Place 1 tablet (0.4 mg total) under the tongue every 5 (five) minutes x 3 doses as needed for chest pain., Disp: 25 tablet, Rfl: 1   rosuvastatin (CRESTOR) 20 MG tablet, Take 1 tablet (20 mg total) by mouth daily., Disp: 90 tablet, Rfl: 3   traZODone (DESYREL) 50 MG tablet, TAKE 1 TABLET(50 MG) BY MOUTH AT BEDTIME AS NEEDED FOR SLEEP, Disp: 90 tablet, Rfl: 3  EXAM:  VITALS per patient if applicable:  GENERAL: alert, oriented, appears well and in no acute distress  HEENT: atraumatic,  conjunttiva clear, no obvious abnormalities on inspection of external nose and ears  NECK: normal movements of the head and neck  LUNGS: on inspection no signs of respiratory distress, breathing rate appears normal, no obvious gross SOB, gasping or wheezing  CV: no obvious cyanosis  MS: moves all visible extremities without noticeable abnormality  PSYCH/NEURO: pleasant and cooperative, no obvious depression or anxiety, speech and thought processing grossly intact  ASSESSMENT AND PLAN:  Discussed the following assessment and plan:  1. COVID-19 - Stay hydrated and rest. Follow up if symptoms worsen - molnupiravir EUA (LAGEVRIO) 200 mg CAPS  capsule; Take 4 capsules (800 mg total) by mouth 2 (two) times daily for 5 days.  Dispense: 40 capsule; Refill: 0      I discussed the assessment and treatment plan with the patient. The patient was provided an opportunity to ask questions and all were answered. The patient agreed with the plan and demonstrated an understanding of the instructions.   The patient was advised to call back or seek an in-person evaluation if the symptoms worsen or if the condition fails to improve as anticipated.   [name], NP

## 2022-09-20 ENCOUNTER — Encounter: Payer: Self-pay | Admitting: Cardiovascular Disease

## 2022-09-27 ENCOUNTER — Other Ambulatory Visit (HOSPITAL_COMMUNITY)
Admission: RE | Admit: 2022-09-27 | Discharge: 2022-09-27 | Disposition: A | Discharge status: Home / Self Care | Payer: BC Managed Care – PPO | Source: Ambulatory Visit | Attending: Obstetrics and Gynecology | Admitting: Obstetrics and Gynecology

## 2022-09-27 ENCOUNTER — Encounter: Payer: Self-pay | Admitting: Obstetrics and Gynecology

## 2022-09-27 ENCOUNTER — Ambulatory Visit (INDEPENDENT_AMBULATORY_CARE_PROVIDER_SITE_OTHER): Payer: BC Managed Care – PPO | Admitting: Obstetrics and Gynecology

## 2022-09-27 VITALS — BP 106/68 | HR 80 | Ht 65.75 in | Wt 166.0 lb

## 2022-09-27 DIAGNOSIS — Z01419 Encounter for gynecological examination (general) (routine) without abnormal findings: Secondary | ICD-10-CM | POA: Diagnosis not present

## 2022-09-27 DIAGNOSIS — E2839 Other primary ovarian failure: Secondary | ICD-10-CM

## 2022-09-27 DIAGNOSIS — R8290 Unspecified abnormal findings in urine: Secondary | ICD-10-CM

## 2022-09-27 DIAGNOSIS — Z0000 Encounter for general adult medical examination without abnormal findings: Secondary | ICD-10-CM

## 2022-09-27 DIAGNOSIS — N898 Other specified noninflammatory disorders of vagina: Secondary | ICD-10-CM

## 2022-09-27 LAB — URINALYSIS, COMPLETE W/RFL CULTURE
Bacteria, UA: NONE SEEN /HPF
Bilirubin Urine: NEGATIVE
Glucose, UA: NEGATIVE
Hgb urine dipstick: NEGATIVE
Hyaline Cast: NONE SEEN /LPF
Ketones, ur: NEGATIVE
Leukocyte Esterase: NEGATIVE
Nitrites, Initial: NEGATIVE
Protein, ur: NEGATIVE
RBC / HPF: NONE SEEN /HPF (ref 0–2)
Specific Gravity, Urine: 1.02 (ref 1.001–1.035)
WBC, UA: NONE SEEN /HPF (ref 0–5)
pH: 7.5 (ref 5.0–8.0)

## 2022-09-27 LAB — NO CULTURE INDICATED

## 2022-09-27 MED ORDER — CLOBETASOL PROPIONATE 0.05 % EX OINT: 1.0000 | TOPICAL_OINTMENT | Freq: Two times a day (BID) | CUTANEOUS | 0 refills | Status: DC

## 2022-09-27 NOTE — Progress Notes (Signed)
57 y.o. y.o. female here for annual exam. She denies any PM bleeding.  Uterus and ovaries intact  No LMP recorded. Patient is postmenopausal.    hypoestrogen state, family history of osteoporis mother wiht hip fracture at 42, sister with back surgeries from degenerative disc disease  She went into menopause when irena IUD was removed No prior HRT use with heart disease. Had second  Coronary artery dissection recently.  Cannot do hormones with this. Pelvic discharge: has odorous discharge Pelvic pain: denies  Last mammogram: UTD Last colonoscopy: due in 5 years, per patient  Blood pressure 106/68, pulse 80, height 5' 5.75" (1.67 m), weight 166 lb (75.3 kg), SpO2 98%.     Component Value Date/Time   DIAGPAP  05/14/2019 1358    - Negative for intraepithelial lesion or malignancy (NILM)   HPVHIGH Negative 05/14/2019 1358   ADEQPAP  05/14/2019 1358    Satisfactory for evaluation; transformation zone component PRESENT.    GYN HISTORY:    Component Value Date/Time   DIAGPAP  05/14/2019 1358    - Negative for intraepithelial lesion or malignancy (NILM)   HPVHIGH Negative 05/14/2019 1358   ADEQPAP  05/14/2019 1358    Satisfactory for evaluation; transformation zone component PRESENT.   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 Leone Payor, benign polyps, repeat in 10 yrs    LEFT HEART CATH AND CORONARY ANGIOGRAPHY N/A 12/20/2021   Procedure: LEFT HEART CATH AND CORONARY ANGIOGRAPHY;  Surgeon: Yvonne Kendall, MD;  Location: [facility];  Service: Cardiovascular;  Laterality: N/A;    Current Outpatient Medications on File Prior to Visit  Medication Sig Dispense Refill   acetaminophen (TYLENOL) 500 MG tablet Take 1,000 mg by mouth every 6 (six) hours as needed for mild pain.     aspirin 81 MG tablet Take 1 tablet (81 mg total) by mouth daily. 30 tablet 0   carvedilol (COREG) 3.125 MG tablet Take 1 tablet (3.125 mg total) by mouth 2 (two) times daily with a meal. 180 tablet 3   melatonin 1 MG TABS tablet Take 2 mg by mouth at bedtime as needed (sleep).     nitroGLYCERIN (NITROSTAT) 0.4 MG SL tablet Place 1 tablet (0.4 mg total) under the tongue every 5 (five) minutes x 3 doses as needed for chest pain. 25 tablet 1   rosuvastatin (CRESTOR) 20 MG tablet Take 1 tablet (20 mg total) by mouth daily. 90 tablet 3  traZODone (DESYREL) 50 MG tablet TAKE 1 TABLET(50 MG) BY MOUTH AT BEDTIME AS NEEDED FOR SLEEP 90 tablet 3   No current facility-administered medications on file prior to visit.    Social History   Socioeconomic History   Marital status: Married    Spouse name: Not on file   Number of children: Not on file   Years of education: Not on file   Highest education level: Not on file  Occupational History   Not on file  Tobacco Use   Smoking status: Never   Smokeless tobacco: Never  Vaping Use   Vaping status: Never Used  Substance and Sexual Activity   Alcohol use: Yes    Comment: weekly - 3-4 a week   Drug use: Never   Sexual activity: Yes    Partners: Male    Birth control/protection: Post-menopausal  Other Topics Concern   Not on file  Social History Narrative   Not on  file   Social Determinants of Health   Financial Resource Strain: Not on file  Food Insecurity: No Food Insecurity (12/20/2021)   Hunger Vital Sign    Worried About Running Out of Food in the Last Year: Never true    Ran Out of Food in the Last Year: Never true  Transportation Needs: No Transportation Needs (12/20/2021)   PRAPARE - Administrator, Civil Service (Medical): No    Lack of Transportation (Non-Medical): No  Physical Activity: Not on file  Stress: Not on file  Social Connections: Not on file  Intimate Partner Violence: Not At Risk (12/20/2021)   Humiliation, Afraid, Rape, and Kick questionnaire    Fear of Current or Ex-Partner: No    Emotionally Abused: No    Physically Abused: No    Sexually Abused: No    Family History  Problem Relation Age of Onset   Mental retardation Mother    Cancer Father    Hyperlipidemia Father    Prostate cancer Father    Breast cancer Paternal Grandmother        71s   Rectal cancer Neg Hx    Stomach cancer Neg Hx      No Known Allergies    Patient's last menstrual period was No LMP recorded. Patient is postmenopausal..             Review of Systems Alls systems reviewed and are negative.     PE General appearance: alert, cooperative and appears stated age Head: Normocephalic, without obvious abnormality, atraumatic Neck: no adenopathy, supple, symmetrical, trachea midline and thyroid normal to inspection and palpation Lungs: clear to auscultation bilaterally Breasts: normal appearance, no masses or tenderness Heart: regular rate and rhythm Abdomen: soft, non-tender; bowel sounds normal; no masses,  no organomegaly Extremities: extremities normal, atraumatic, no cyanosis or edema Skin: Skin color, texture, turgor normal. No rashes or lesions Lymph nodes: Cervical, supraclavicular, and axillary nodes normal. No abnormal inguinal nodes palpated Neurologic: Grossly normal     Pelvic: External genitalia:  no  lesions              Urethra:  normal appearing urethra with no masses, tenderness or lesions              Bartholins and Skenes: normal                 Vagina: normal appearing vagina with normal color and discharge, no lesions.               Cervix:  no lesions, no cervical motion tenderness               Bimanual Exam:  Uterus:  normal size, contour, position, consistency, mobility, non-tender              Adnexa: no mass, fullness, tenderness          Chaperone was present for exam.   A:         Well Woman GYN exam                             P:        Pap smear collected             Encouraged annual mammogram screening            History of LS- no recent flares.  Would like the clobetasol on hand.  To verify with cardiology before taking.  To use only sparingly with flares             Labs and immunizations with her primary             To return for bone scan  Earley Favor

## 2022-09-28 LAB — SURESWAB® ADVANCED VAGINITIS PLUS,TMA
C. trachomatis RNA, TMA: NOT DETECTED
CANDIDA SPECIES: NOT DETECTED
Candida glabrata: NOT DETECTED
N. gonorrhoeae RNA, TMA: NOT DETECTED
SURESWAB(R) ADV BACTERIAL VAGINOSIS(BV),TMA: NEGATIVE
TRICHOMONAS VAGINALIS (TV),TMA: NOT DETECTED

## 2022-09-30 LAB — CYTOLOGY - PAP
Comment: NEGATIVE
Diagnosis: NEGATIVE
High risk HPV: NEGATIVE

## 2022-10-05 ENCOUNTER — Ambulatory Visit: Payer: BC Managed Care – PPO | Attending: Cardiovascular Disease | Admitting: Cardiovascular Disease

## 2022-10-05 ENCOUNTER — Encounter: Payer: Self-pay | Admitting: Cardiovascular Disease

## 2022-10-05 VITALS — BP 116/68 | HR 76 | Ht 67.0 in | Wt 164.0 lb

## 2022-10-05 DIAGNOSIS — I773 Arterial fibromuscular dysplasia: Secondary | ICD-10-CM | POA: Diagnosis not present

## 2022-10-05 DIAGNOSIS — E782 Mixed hyperlipidemia: Secondary | ICD-10-CM | POA: Diagnosis not present

## 2022-10-05 DIAGNOSIS — I2542 Coronary artery dissection: Secondary | ICD-10-CM | POA: Diagnosis not present
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
## 2022-10-05 NOTE — Patient Instructions (Signed)
Medication Instructions:  Your physician recommends that you continue on your current medications as directed. Please refer to the Current Medication list given to you today.  *If you need a refill on your cardiac medications before your next appointment, please call your pharmacy*  Follow-Up: At [facility], you and your health needs are our priority.  As part of our continuing mission to provide you with exceptional heart care, we have created designated Provider Care Teams.  These Care Teams include your primary Cardiologist (physician) and Advanced Practice Providers (APPs -  Physician Assistants and Nurse Practitioners) who all work together to provide you with the care you need, when you need it.  Your next appointment:   1 year  Provider:   Tonny Bollman, MD

## 2022-11-22 ENCOUNTER — Ambulatory Visit (HOSPITAL_BASED_OUTPATIENT_CLINIC_OR_DEPARTMENT_OTHER)
Admission: RE | Admit: 2022-11-22 | Discharge: 2022-11-22 | Disposition: A | Discharge status: Home / Self Care | Payer: BC Managed Care – PPO | Source: Ambulatory Visit | Attending: Obstetrics and Gynecology | Admitting: Obstetrics and Gynecology

## 2022-11-22 DIAGNOSIS — E2839 Other primary ovarian failure: Secondary | ICD-10-CM | POA: Insufficient documentation

## 2022-12-07 ENCOUNTER — Ambulatory Visit: Payer: BC Managed Care – PPO | Admitting: Obstetrics and Gynecology

## 2022-12-13 ENCOUNTER — Encounter: Payer: Self-pay | Admitting: Obstetrics and Gynecology

## 2022-12-13 ENCOUNTER — Ambulatory Visit: Payer: BC Managed Care – PPO | Admitting: Obstetrics and Gynecology

## 2022-12-13 VITALS — BP 114/70 | HR 69 | Ht 65.25 in

## 2022-12-13 DIAGNOSIS — M810 Age-related osteoporosis without current pathological fracture: Secondary | ICD-10-CM

## 2022-12-13 DIAGNOSIS — M8000XA Age-related osteoporosis with current pathological fracture, unspecified site, initial encounter for fracture: Secondary | ICD-10-CM

## 2022-12-13 NOTE — Patient Instructions (Addendum)
Bone Health Bones protect organs, store calcium, anchor muscles, and support the whole body. Keeping your bones strong is important, especially as you get older. You can take actions to help keep your bones strong and healthy. Why is keeping my bones healthy important?  Keeping your bones healthy is important because your body constantly replaces bone cells. Cells get old, and new cells take their place. As we age, we lose bone cells because the body may not be able to make enough new cells to replace the old cells. The amount of bone cells and bone tissue you have is referred to as bone mass. The higher your bone mass, the stronger your bones. The aging process leads to an overall loss of bone mass in the body, which can increase the likelihood of: Broken bones. A condition in which the bones become weak and brittle (osteoporosis). A large decline in bone mass occurs in older adults. In women, it occurs about the time of menopause. What actions can I take to keep my bones healthy? Good health habits are important for maintaining healthy bones. This includes eating nutritious foods and exercising regularly. To have healthy bones, you need to get enough of the right minerals and vitamins. Most nutrition experts recommend getting these nutrients from the foods that you eat. In some cases, taking supplements may also be recommended. Doing certain types of exercise is also important for bone health. What are the nutritional recommendations for healthy bones?  Eating a well-balanced diet with plenty of calcium and vitamin D will help to protect your bones. Nutritional recommendations vary from person to person. Ask your health care provider what is healthy for you. Here are some general guidelines. Get enough calcium Calcium is the most important (essential) mineral for bone health. Most people can get enough calcium from their diet, but supplements may be recommended for people who are at risk for  osteoporosis. Good sources of calcium include: Dairy products, such as low-fat or nonfat milk, cheese, and yogurt. Dark green leafy vegetables, such as bok choy and broccoli. Foods that have calcium added to them (are fortified). Foods that may be fortified with calcium include orange juice, cereal, bread, soy beverages, and tofu products. Nuts, such as almonds. Follow these recommended amounts for daily calcium intake: Infants, 0-6 months: 200 mg. Infants, 6-12 months: 260 mg. Children, age 57-3: 700 mg. Children, age 55-8: 1,000 mg. Children, age 556-13: 1,300 mg. Teens, age 75-18: 1,300 mg. Adults, age 63-50: 1,000 mg. Adults, age 572-70: Men: 1,000 mg. Women: 1,200 mg. Adults, age 25 or older: 1,200 mg. Pregnant and breastfeeding females: Teens: 1,300 mg. Adults: 1,000 mg. Get enough vitamin D Vitamin D is the most essential vitamin for bone health. It helps the body absorb calcium. Sunlight stimulates the skin to make vitamin D, so be sure to get enough sunlight. If you live in a cold climate or you do not get outside often, your health care provider may recommend that you take vitamin D supplements. Good sources of vitamin D in your diet include: Egg yolks. Saltwater fish. Milk and cereal fortified with vitamin D. Follow these recommended amounts for daily vitamin D intake: Infants, 0-12 months: 400 international units (IU). Children and teens, age 57-18: 600 international units. Adults, age 87 or younger: 600 international units. Adults, age 64 or older: 600-1,000 international units. Get other important nutrients Other nutrients that are important for bone health include: Phosphorus. This mineral is found in meat, poultry, dairy foods, nuts, and legumes.  The recommended daily intake for adult men and adult women is 700 mg. Magnesium. This mineral is found in seeds, nuts, dark green vegetables, and legumes. The recommended daily intake for adult men is 400-420 mg. For adult women,  it is 310-320 mg. Vitamin K. This vitamin is found in green leafy vegetables. The recommended daily intake is 120 mcg for adult men and 90 mcg for adult women. What type of physical activity is best for building and maintaining healthy bones? Weight-bearing and strength-building activities are important for building and maintaining healthy bones. Weight-bearing activities cause muscles and bones to work against gravity. Strength-building activities increase the strength of the muscles that support bones. Weight-bearing and muscle-building activities include: Walking and hiking. Jogging and running. Dancing. Gym exercises. Lifting weights. Tennis and racquetball. Climbing stairs. Aerobics. Adults should get at least 30 minutes of moderate physical activity on most days. Children should get at least 60 minutes of moderate physical activity on most days. Ask your health care provider what type of exercise is best for you. How can I find out if my bone mass is low? Bone mass can be measured with an X-ray test called a bone mineral density (BMD) test. This test is recommended for all women who are age 94 or older. It may also be recommended for: Men who are age 21 or older. People who are at risk for osteoporosis because of: Having a long-term disease that weakens bones, such as kidney disease or rheumatoid arthritis. Having menopause earlier than normal. Taking medicine that weakens bones, such as steroids, thyroid hormones, or hormone treatment for breast cancer or prostate cancer. Smoking. Drinking three or more alcoholic drinks a day. Being underweight. Sedentary lifestyle. If you find that you have a low bone mass, you may be able to prevent osteoporosis or further bone loss by changing your diet and lifestyle. Where can I find more information? Bone Health & Osteoporosis Foundation: https://carlson-fletcher.info/ [employer]: www.bones.http://www.myers.net/ International Osteoporosis  Foundation: Investment banker, operational.iofbonehealth.org Summary The aging process leads to an overall loss of bone mass in the body, which can increase the likelihood of broken bones and osteoporosis. Eating a well-balanced diet with plenty of calcium and vitamin D will help to protect your bones. Weight-bearing and strength-building activities are also important for building and maintaining strong bones. Bone mass can be measured with an X-ray test called a bone mineral density (BMD) test. This information is not intended to replace advice given to you by your health care provider. Make sure you discuss any questions you have with your health care provider. Document Revised: 06/03/2020 Document Reviewed: 06/03/2020 Elsevier Patient Education  2024 Elsevier Inc.    Medications: Bisphosphonate: taken orally on a weekly basis on an empty stomach, side effects reflux and drug inconvenience. Decreases bone resorption but does not build.  Can only take for 3-5 years and you have to take a drug holiday and try another medication.  IV zoledronic acid (reclast): This is given in an IV and is a bisphoshonate once a year for 3 years then take a holiday and start a different medication.  There is about 3% bone building and has benefits on the spine.  Prolia would be a good drug following the reclast.  Evenity injection: 15% bone building. Only one year treatment.  Cannot have a history of MI in the past year. Can cause hypocalcemia, joint pain and headaches.  Anabolic agents: PTH hormone. daily SQ dose.  Only use for 2 years Prolia: injection monoclonal antibody,  rebound risk for fracture when stopped.  Should stay on this.  Great to stabilize bone after evenity. SERM: Raloxifene oral pill taken daily. Reduces resorption of bone, lowers risk for breast cancer but has risk of DVT's  So reclast or PTH might be the better option. I can put a PA in for this as well.

## 2022-12-13 NOTE — Progress Notes (Signed)
Patient with dxa of -3.9  Patient with SCADS- coronary artery dissection. Second one in December.  Seeing a specialist for this.  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 Decreases bone resorption but does not build.  Can only take for 3-5 years and you have to take a drug holiday and try another medication.  IV zoledronic acid (reclast): This is given in an IV and is a bisphoshonate once a year for 3 years then take a holiday and start a different medication.  There is about 3% bone building and has benefits on the spine.  Prolia would be a good drug following the reclast.  Evenity injection: 15% bone building. Only one year treatment.  Cannot have a history of MI in the past year. Can cause hypocalcemia, joint pain and headaches.  Anabolic agents: PTH hormone. daily SQ dose.  Only use for 2 years Prolia: injection monoclonal antibody, rebound risk for fracture when stopped.  Should stay on this.  Great to stabilize bone after evenity. SERM: Raloxifene oral pill taken daily. Reduces resorption of bone, lowers risk for breast cancer but has risk of DVT's  So reclast or PTH might be the better option. She will discuss with her cardiologist and we can run PA for options, but she was encouraged to begin treatment soon.  30 minutes spent on reviewing records, imaging,  and one on one patient time and counseling patient and documentation Dr. Karma Greaser

## 2022-12-15 ENCOUNTER — Encounter: Payer: Self-pay | Admitting: Obstetrics and Gynecology

## 2022-12-15 DIAGNOSIS — M8000XA Age-related osteoporosis with current pathological fracture, unspecified site, initial encounter for fracture: Secondary | ICD-10-CM

## 2022-12-19 MED ORDER — ROMOSOZUMAB-AQQG 105 MG/1.17ML ~~LOC~~ SOSY
210.0000 mg | PREFILLED_SYRINGE | Freq: Once | SUBCUTANEOUS | Status: AC
  Administered 2023-05-04: 210 mg via SUBCUTANEOUS

## 2022-12-20 NOTE — Addendum Note (Signed)
Addended by: Jodelle Red D on: 12/20/2022 10:24 AM   Modules accepted: Orders

## 2023-01-10 ENCOUNTER — Telehealth: Payer: Self-pay | Admitting: *Deleted

## 2023-01-10 DIAGNOSIS — M810 Age-related osteoporosis without current pathological fracture: Secondary | ICD-10-CM

## 2023-01-10 NOTE — Telephone Encounter (Signed)
-----   Message from Earley Favor sent at 12/19/2022  1:03 PM EST ----- She spoke with her cardiologist and we can start with evenity to get her bone density up. She has severe osteoporosis at -3.9 with reflux. Thank you Dr. Karma Greaser

## 2023-01-10 NOTE — Telephone Encounter (Signed)
 Insurance information submitted to [employer] portal. Will await summary of benefits for Evenity.

## 2023-01-17 NOTE — Telephone Encounter (Signed)
Routing to [employer].

## 2023-01-18 NOTE — Telephone Encounter (Signed)
 Call to [facility] through [place], spoke with Bronwen Canon, reference #: [id number]. Bronwen Canon states prior to disclosing benefits for codes [phone] and 19147, patient will need to meet with provider and review CPB codes 0524 (medication [phone] code) and CPB 0215 (administration code).   RN reviewed with nursing supervisor. Will await response on how to proceed with scheduling.

## 2023-01-18 NOTE — Telephone Encounter (Signed)
 Tina Caras, MD  Clearence Curet, RN I think reclast would be better in her case.  Can we do a PA

## 2023-01-19 NOTE — Telephone Encounter (Signed)
Please advise. Do you want patient to begin Evenity or Reclast? Thanks.

## 2023-01-19 NOTE — Telephone Encounter (Signed)
Tina Juarez "Tina Juarez" to [facility] (supporting Earley Favor, MD)      01/16/23  4:04 PM Hi Dr Karma Greaser,   I didn't know how to send a message to Irving Burton about getting started on Evenity since I haven't heard back about this. The wrinkle is that my insurance changed in the new year. The state health plan changed to [facility]. I tried to update the insurance info in [place] but it didn't seem to work. Please let me know how to get this process started.    Thanks  [name]

## 2023-01-24 NOTE — Telephone Encounter (Signed)
Call to patient. Insurance information provided over the phone for [facility]. Member ID: [id number], Group number: [id number]. Insurance information resubmitted to [employer]. Will await summary of benefits. Patient aware will be contact once benefits back.

## 2023-01-31 NOTE — Telephone Encounter (Signed)
Updated insurance card faxed to [employer] for insurance verification at [phone]. Will await response with summary of benefits.

## 2023-02-13 NOTE — Telephone Encounter (Signed)
 PA for Evenity  submitted to [employer] via [employer] portal. Will await response.

## 2023-02-15 NOTE — Telephone Encounter (Signed)
[employer] requiring additional clinical information. Clinical information faxed to [employer] at [phone]. Will await response.

## 2023-02-21 NOTE — Telephone Encounter (Signed)
Appeal letter signed by Dr. Karma Greaser and faxed to [facility] at [phone]. Will await response. MyChart message sent to patient in regards to appeal process.

## 2023-02-28 NOTE — Telephone Encounter (Signed)
 Call to patient. Advised patient if letter dated prior to appeal then it was from original prior authorization denial. Patient states she is not currently at home, but will check. Appeal letter faxed on 02/21/23 and advised patient could take up to 30 days to receive response. Patient agreeable.

## 2023-03-13 ENCOUNTER — Other Ambulatory Visit: Payer: Self-pay | Admitting: Cardiovascular Disease

## 2023-03-21 NOTE — Telephone Encounter (Addendum)
 Deductible:  none  OOP MAX: none  Annual exam: 09/27/22  Calcium:     9.5      Date: 03/23/23 GRF:      77           Date: 03/23/23 Vitamin D: 68   Date: 03/23/23  Upcoming dental procedures: No   Hx of Kidney Disease: No   Hx of heart attack or stroke: no   Last Bone Density Scan: 11/22/22   Is Prior Authorization needed: yes  Pt estimated Cost: $0, sign waiver  Coverage Details: per Candise Bowens, Engineer, manufacturing at [place], covered at 100%. No deductible.   Patient scheduled for labs prior to evenity on 03/23/23 at 0930. Patient agreeable to date and time of appointment. Aware once labs back and reviewed by Dr. Karma Greaser, will reach out to schedule first evenity injection.

## 2023-03-21 NOTE — Telephone Encounter (Signed)
 Call to [facility], spoke with Candise Bowens, Customer Service advocate who states for [phone] and admin code [id number], covered at 100%. No deductible. Reference # for call: [id number]. PA has been approved through 03/05/24. Case #: [id number].

## 2023-03-23 ENCOUNTER — Other Ambulatory Visit: Payer: Self-pay

## 2023-03-23 DIAGNOSIS — M810 Age-related osteoporosis without current pathological fracture: Secondary | ICD-10-CM

## 2023-03-23 LAB — COMPREHENSIVE METABOLIC PANEL
AG Ratio: 1.5 (calc) (ref 1.0–2.5)
ALT: 12 U/L (ref 6–29)
AST: 16 U/L (ref 10–35)
Albumin: 4.1 g/dL (ref 3.6–5.1)
Alkaline phosphatase (APISO): 71 U/L (ref 37–153)
BUN: 15 mg/dL (ref 7–25)
CO2: 29 mmol/L (ref 20–32)
Calcium: 9.5 mg/dL (ref 8.6–10.4)
Chloride: 102 mmol/L (ref 98–110)
Creat: 0.87 mg/dL (ref 0.50–1.03)
Globulin: 2.7 g/dL (ref 1.9–3.7)
Glucose, Bld: 67 mg/dL (ref 65–99)
Potassium: 4.3 mmol/L (ref 3.5–5.3)
Sodium: 139 mmol/L (ref 135–146)
Total Bilirubin: 0.5 mg/dL (ref 0.2–1.2)
Total Protein: 6.8 g/dL (ref 6.1–8.1)
eGFR: 77 mL/min/{1.73_m2} (ref >=60)

## 2023-03-23 LAB — VITAMIN D 25 HYDROXY (VIT D DEFICIENCY, FRACTURES): Vit D, 25-Hydroxy: 68 ng/mL (ref 30–100)

## 2023-03-24 ENCOUNTER — Encounter: Payer: Self-pay | Admitting: Obstetrics and Gynecology

## 2023-03-29 MED ORDER — ROMOSOZUMAB-AQQG 105 MG/1.17ML ~~LOC~~ SOSY
210.0000 mg | PREFILLED_SYRINGE | Freq: Once | SUBCUTANEOUS | Status: AC
  Administered 2023-04-03: 210 mg via SUBCUTANEOUS

## 2023-03-29 NOTE — Telephone Encounter (Signed)
 Earley Favor, MD to Me     03/24/23  9:14 AM Result Note Vit D, Calcium and kidney function in good range.  I will send to [facility]. Earley Favor, MD to [facility] "Misty Stanley"      03/24/23  9:14 AM Vit D, Calcium and kidney function in good range.  I will send to [facility].  Last read by Purcell Mouton "Misty Stanley" at 9:31AM on 03/24/2023.

## 2023-03-29 NOTE — Telephone Encounter (Signed)
 Call to patient. Labs normal per Dr. Karma Greaser okay to proceed with Evenity. Patient scheduled for 04/03/23 at 1545. Patient agreeable to date and time of appointment. Summary of benefits scanned into Epic. Order placed. Encounter closed.

## 2023-03-29 NOTE — Telephone Encounter (Signed)
 Earley Favor, MD to [facility] "Tina Juarez"      12/19/22  1:02 PM Thank you Tina Juarez for getting back so quickly.  I will send a message to [facility] and see if we can get the evenity going asap.  We will need to run through insurance. Dr. Karma Greaser  Last read by Tina Juarez "Tina Juarez" at 3:55PM on 01/16/2023.   12/16/22 10:45 AM Tina Juarez, CMA routed this conversation to Earley Favor, MD Tina Juarez "Tina Juarez" to [facility] (supporting Earley Favor, MD)      12/15/22  8:41 PM My SCAD-specialist cardiologist thought all of the proposed osteoporosis medications looked fine. If you want to go with Evenity, I had my MI on December 17 last year so I don't know if it needs to be prescribed after that. It would be good to get a treatment in this year if it's something expensive because I've used up my deductible. Thank you!

## 2023-04-03 ENCOUNTER — Ambulatory Visit: Admitting: *Deleted

## 2023-04-03 DIAGNOSIS — M810 Age-related osteoporosis without current pathological fracture: Secondary | ICD-10-CM | POA: Diagnosis not present

## 2023-04-03 MED ORDER — ROMOSOZUMAB-AQQG 105 MG/1.17ML ~~LOC~~ SOSY: 210.0000 mg | PREFILLED_SYRINGE | SUBCUTANEOUS | Status: AC

## 2023-05-03 ENCOUNTER — Ambulatory Visit

## 2023-05-04 ENCOUNTER — Ambulatory Visit (INDEPENDENT_AMBULATORY_CARE_PROVIDER_SITE_OTHER)

## 2023-05-04 ENCOUNTER — Other Ambulatory Visit: Payer: Self-pay

## 2023-05-04 DIAGNOSIS — M810 Age-related osteoporosis without current pathological fracture: Secondary | ICD-10-CM

## 2023-05-04 NOTE — Progress Notes (Signed)
 Evenity  injections given SQ right arm.  Patient tolerated injections well.  Last AEX 09/27/22 Dr. Tia Flowers.

## 2023-05-10 ENCOUNTER — Other Ambulatory Visit: Payer: Self-pay | Admitting: *Deleted

## 2023-05-10 DIAGNOSIS — M810 Age-related osteoporosis without current pathological fracture: Secondary | ICD-10-CM

## 2023-05-10 MED ORDER — ROMOSOZUMAB-AQQG 105 MG/1.17ML ~~LOC~~ SOSY
210.0000 mg | PREFILLED_SYRINGE | SUBCUTANEOUS | Status: AC
  Administered 2023-06-07 – 2023-09-15 (×5): 210 mg via SUBCUTANEOUS

## 2023-05-30 ENCOUNTER — Other Ambulatory Visit: Payer: Self-pay | Admitting: Family Medicine

## 2023-05-30 DIAGNOSIS — Z1231 Encounter for screening mammogram for malignant neoplasm of breast: Secondary | ICD-10-CM

## 2023-06-05 ENCOUNTER — Ambulatory Visit (INDEPENDENT_AMBULATORY_CARE_PROVIDER_SITE_OTHER)

## 2023-06-07 DIAGNOSIS — M810 Age-related osteoporosis without current pathological fracture: Secondary | ICD-10-CM

## 2023-06-28 ENCOUNTER — Other Ambulatory Visit: Payer: Self-pay | Admitting: Family Medicine

## 2023-06-28 ENCOUNTER — Ambulatory Visit
Admission: RE | Admit: 2023-06-28 | Discharge: 2023-06-28 | Disposition: A | Discharge status: Home / Self Care | Payer: Self-pay | Source: Ambulatory Visit

## 2023-06-28 DIAGNOSIS — Z1231 Encounter for screening mammogram for malignant neoplasm of breast: Secondary | ICD-10-CM

## 2023-07-05 ENCOUNTER — Telehealth: Payer: Self-pay | Admitting: *Deleted

## 2023-07-05 ENCOUNTER — Ambulatory Visit

## 2023-07-05 NOTE — Telephone Encounter (Signed)
 Call to [facility] as bulletin on [employer] website states, changes coming to our commercial medical plan drug lists starting July 04, 2023. Evenity  is listed as moving to pharmacy-- only coverage.   RN spoke with Roberto, Representative at [place] who states that list is only applicable to patient's who are fully insurance under [employer]. This patient is self- insured, so Roberto states she is excluded and the office can file the Evenity  claims under the patient's medical benefit. Reference # for call: [id number].  Will keep patient's appointment as scheduled for today. Encounter closed.

## 2023-07-10 NOTE — Telephone Encounter (Addendum)
 RN spoke with Amy, [employer] Rep, who states okay to schedule patient as their is a reference number stating that it is okay to file under medical benefits. Amy also states, [place] has told [employer] that this information was a mistake posted to their website. Called patient and updated of above. Patient agreeable to schedule. Patient scheduled for 07/11/23 at 1015. Patient agreeable to date and time of appointment.   Encounter closed.

## 2023-07-11 ENCOUNTER — Ambulatory Visit (INDEPENDENT_AMBULATORY_CARE_PROVIDER_SITE_OTHER): Admitting: *Deleted

## 2023-07-11 DIAGNOSIS — M810 Age-related osteoporosis without current pathological fracture: Secondary | ICD-10-CM

## 2023-08-11 ENCOUNTER — Ambulatory Visit

## 2023-08-15 ENCOUNTER — Ambulatory Visit (INDEPENDENT_AMBULATORY_CARE_PROVIDER_SITE_OTHER)

## 2023-08-15 DIAGNOSIS — M810 Age-related osteoporosis without current pathological fracture: Secondary | ICD-10-CM | POA: Diagnosis not present

## 2023-08-15 NOTE — Progress Notes (Signed)
 5th Evenity  injection given on 08/15/23 on the left arm pt did well.

## 2023-09-15 ENCOUNTER — Ambulatory Visit (INDEPENDENT_AMBULATORY_CARE_PROVIDER_SITE_OTHER)

## 2023-09-15 ENCOUNTER — Encounter: Payer: Self-pay | Admitting: Obstetrics and Gynecology

## 2023-09-15 DIAGNOSIS — M810 Age-related osteoporosis without current pathological fracture: Secondary | ICD-10-CM

## 2023-09-18 ENCOUNTER — Ambulatory Visit

## 2023-09-28 ENCOUNTER — Ambulatory Visit (INDEPENDENT_AMBULATORY_CARE_PROVIDER_SITE_OTHER): Admitting: Obstetrics and Gynecology

## 2023-09-28 ENCOUNTER — Ambulatory Visit: Payer: Self-pay | Admitting: Obstetrics and Gynecology

## 2023-09-28 ENCOUNTER — Encounter: Payer: Self-pay | Admitting: Obstetrics and Gynecology

## 2023-09-28 VITALS — BP 118/78 | HR 76 | Ht 65.5 in | Wt 168.0 lb

## 2023-09-28 DIAGNOSIS — Z1331 Encounter for screening for depression: Secondary | ICD-10-CM

## 2023-09-28 DIAGNOSIS — E2839 Other primary ovarian failure: Secondary | ICD-10-CM

## 2023-09-28 DIAGNOSIS — Z23 Encounter for immunization: Secondary | ICD-10-CM | POA: Diagnosis not present

## 2023-09-28 DIAGNOSIS — Z01419 Encounter for gynecological examination (general) (routine) without abnormal findings: Secondary | ICD-10-CM

## 2023-09-28 DIAGNOSIS — M8080XA Other osteoporosis with current pathological fracture, unspecified site, initial encounter for fracture: Secondary | ICD-10-CM

## 2023-09-28 MED ORDER — UNABLE TO FIND: 1.0000 | Freq: Every day | 0 refills | Status: AC

## 2023-09-28 NOTE — Progress Notes (Signed)
 58 y.o. y.o. female here for annual exam. No LMP recorded. Patient is postmenopausal.   female here for annual exam. She denies any PM bleeding.  Uterus and ovaries intact  No LMP recorded. Patient is postmenopausal.    hypoestrogen state, family history of osteoporis mother with hip fracture at 47, sister with back surgeries from degenerative disc disease. Her 11/22/22 -3.9 AP spine. On 6 months of evenity . No fractures.She has done well with the shots.  Has had some central redness for 2 days after the shot that resolves near the injection site.  She went into menopause when mirena  IUD was removed No prior HRT use with heart disease. Had second  Coronary artery dissection recently.  Cannot do hormones with this. Considering intrarosa or femring and to discuss with cardiologist. Pelvic discharge: denies Pelvic pain: denies Pap 2024 Last mammogram: 09/27/22 Last colonoscopy: 2019 repeat in 10 years Body mass index is 27.53 kg/m.     09/28/2023    9:48 AM 04/08/2022    9:52 AM 02/14/2022   11:18 AM  Depression screen PHQ 2/9  Decreased Interest 0 0 0  Down, Depressed, Hopeless 0 0 0  PHQ - 2 Score 0 0 0  Altered sleeping  1 2  Tired, decreased energy  1 1  Change in appetite  0 0  Feeling bad or failure about yourself   0 0  Trouble concentrating  0 1  Moving slowly or fidgety/restless  0 0  Suicidal thoughts  0 0  PHQ-9 Score  2 4  Difficult doing work/chores   Somewhat difficult    Blood pressure 118/78, pulse 76, height 5' 5.5 (1.664 m), weight 168 lb (76.2 kg), SpO2 99%.     Component Value Date/Time   DIAGPAP  09/27/2022 1023    - Negative for intraepithelial lesion or malignancy (NILM)   DIAGPAP  05/14/2019 1358    - Negative for intraepithelial lesion or malignancy (NILM)   HPVHIGH Negative 09/27/2022 1023   HPVHIGH Negative 05/14/2019 1358   ADEQPAP  09/27/2022 1023    Satisfactory for evaluation; transformation zone component PRESENT.   ADEQPAP   05/14/2019 1358    Satisfactory for evaluation; transformation zone component PRESENT.    GYN HISTORY:    Component Value Date/Time   DIAGPAP  09/27/2022 1023    - Negative for intraepithelial lesion or malignancy (NILM)   DIAGPAP  05/14/2019 1358    - Negative for intraepithelial lesion or malignancy (NILM)   HPVHIGH Negative 09/27/2022 1023   HPVHIGH Negative 05/14/2019 1358   ADEQPAP  09/27/2022 1023    Satisfactory for evaluation; transformation zone component PRESENT.   ADEQPAP  05/14/2019 1358    Satisfactory for evaluation; transformation zone component PRESENT.    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 Avram, benign polyps, repeat in 10 yrs    LEFT HEART CATH AND CORONARY ANGIOGRAPHY N/A 12/20/2021   Procedure: LEFT HEART CATH AND CORONARY ANGIOGRAPHY;  Surgeon: Mady Bruckner, MD;  Location: [facility] INVASIVE CV LAB;  Service: Cardiovascular;  Laterality: N/A;    Current Outpatient Medications on File Prior to Visit  Medication Sig Dispense Refill   acetaminophen  (TYLENOL ) 500 MG tablet Take 1,000 mg by mouth every 6 (six) hours as needed for mild pain.     aspirin  81 MG tablet Take 1 tablet (81 mg total) by mouth daily.  30 tablet 0   calcium  carbonate 100 mg/ml SUSP Take 1,200 mg by mouth.     carvedilol  (COREG ) 3.125 MG tablet TAKE 1 TABLET(3.125 MG) BY MOUTH TWICE DAILY WITH A MEAL 180 tablet 2   traZODone  (DESYREL ) 50 MG tablet TAKE 1 TABLET(50 MG) BY MOUTH AT BEDTIME AS NEEDED FOR SLEEP 90 tablet 3   nitroGLYCERIN  (NITROSTAT ) 0.4 MG SL tablet Place 1 tablet (0.4 mg total) under the tongue every 5 (five) minutes x 3 doses as needed for chest pain. (Patient not taking: Reported on 09/28/2023) 25 tablet 1   No current facility-administered medications on file prior to visit.    Social History   Socioeconomic History   Marital status: Married    Spouse name: Not on file   Number of children: Not on file   Years of education: Not on file   Highest education level: Not on file  Occupational History   Not on file  Tobacco Use   Smoking status: Never   Smokeless tobacco: Never  Vaping Use   Vaping status: Never Used  Substance and Sexual Activity   Alcohol use: Yes    Comment: weekly - 3-4 a week   Drug use: Never   Sexual activity: Yes    Partners: Male    Birth control/protection: Post-menopausal  Other Topics Concern   Not on file  Social History Narrative   Not on file   Social Drivers of Health   Financial Resource Strain: Not on file  Food Insecurity: No Food Insecurity (12/20/2021)   Hunger Vital Sign    Worried About Running Out of Food in the Last Year: Never true    Ran Out of Food in the Last Year: Never true  Transportation Needs: No Transportation Needs (12/20/2021)   PRAPARE - Administrator, Civil Service (Medical): No    Lack of Transportation (Non-Medical): No  Physical Activity: Not on file  Stress: Not on file  Social Connections: Not on file  Intimate Partner Violence: Not At Risk (12/20/2021)   Humiliation, Afraid, Rape, and Kick questionnaire    Fear of Current or Ex-Partner: No    Emotionally Abused: No    Physically Abused: No    Sexually Abused:  No    Family History  Problem Relation Age of Onset   Mental retardation Mother    Cancer Father    Hyperlipidemia Father    Prostate cancer Father    Breast cancer Paternal Grandmother        71s   Rectal cancer Neg Hx    Stomach cancer Neg Hx      No Known  Allergies    Patient's last menstrual period was No LMP recorded. Patient is postmenopausal..            Review of Systems Alls systems reviewed and are negative.     Physical Exam Constitutional:      Appearance: Normal appearance.  Genitourinary:     Vulva and urethral meatus normal.     No lesions in the vagina.     Right Labia: No rash, lesions or skin changes.    Left Labia: No lesions, skin changes or rash.    No vaginal discharge or tenderness.     No vaginal prolapse present.    Moderate vaginal atrophy present.     Right Adnexa: not tender, not palpable and no mass present.    Left Adnexa: not tender, not palpable and no mass present.    No cervical motion tenderness or discharge.     Uterus is not enlarged, tender or irregular.  Breasts:    Right: Normal.     Left: Normal.  HENT:     Head: Normocephalic.  Neck:     Thyroid: No thyroid mass, thyromegaly or thyroid tenderness.  Cardiovascular:     Rate and Rhythm: Normal rate and regular rhythm.     Heart sounds: Normal heart sounds, S1 normal and S2 normal.  Pulmonary:     Effort: Pulmonary effort is normal.     Breath sounds: Normal breath sounds and air entry.  Abdominal:     General: There is no distension.     Palpations: Abdomen is soft. There is no mass.     Tenderness: There is no abdominal tenderness. There is no guarding or rebound.  Musculoskeletal:        General: Normal range of motion.     Cervical back: Full passive range of motion without pain, normal range of motion and neck supple. No tenderness.     Right lower leg: No edema.     Left lower leg: No edema.  Neurological:     Mental Status: She is alert.  Skin:     General: Skin is warm.  Psychiatric:        Mood and Affect: Mood normal.        Behavior: Behavior normal.        Thought Content: Thought content normal.  Vitals and nursing note reviewed. Exam conducted with a chaperone present.       A:         Well Woman GYN exam Severe osteoporosis on evenity  no fractures                              P:        Pap smear not indicated Encouraged annual mammogram screening Colon cancer screening up-to-date DXA ordered today To repeat in one year from finishing evenity   Labs and immunizations ordered today Discussed breast self exams Encouraged healthy lifestyle practices Encouraged Vit D and Calcium    No follow-ups on file.  Tina Juarez

## 2023-09-29 LAB — COMPREHENSIVE METABOLIC PANEL WITH GFR
AG Ratio: 1.6 (calc) (ref 1.0–2.5)
ALT: 15 U/L (ref 6–29)
AST: 17 U/L (ref 10–35)
Albumin: 4.3 g/dL (ref 3.6–5.1)
Alkaline phosphatase (APISO): 63 U/L (ref 37–153)
BUN: 20 mg/dL (ref 7–25)
CO2: 30 mmol/L (ref 20–32)
Calcium: 9.6 mg/dL (ref 8.6–10.4)
Chloride: 101 mmol/L (ref 98–110)
Creat: 0.73 mg/dL (ref 0.50–1.03)
Globulin: 2.7 g/dL (ref 1.9–3.7)
Glucose, Bld: 95 mg/dL (ref 65–99)
Potassium: 4.4 mmol/L (ref 3.5–5.3)
Sodium: 138 mmol/L (ref 135–146)
Total Bilirubin: 0.5 mg/dL (ref 0.2–1.2)
Total Protein: 7 g/dL (ref 6.1–8.1)
eGFR: 95 mL/min/1.73m2 (ref >=60)

## 2023-09-29 LAB — HEMOGLOBIN A1C
Hgb A1c MFr Bld: 5.5 % (ref <5.7)
Mean Plasma Glucose: 111 mg/dL
eAG (mmol/L): 6.2 mmol/L

## 2023-09-29 LAB — C-REACTIVE PROTEIN: CRP: <3 mg/L (ref <8.0)

## 2023-09-29 LAB — VITAMIN D 25 HYDROXY (VIT D DEFICIENCY, FRACTURES): Vit D, 25-Hydroxy: 68 ng/mL (ref 30–100)

## 2023-09-29 LAB — CBC
HCT: 41.9 % (ref 35.0–45.0)
Hemoglobin: 13.9 g/dL (ref 11.7–15.5)
MCH: 32.3 pg (ref 27.0–33.0)
MCHC: 33.2 g/dL (ref 32.0–36.0)
MCV: 97.4 fL (ref 80.0–100.0)
MPV: 11.9 fL (ref 7.5–12.5)
Platelets: 276 Thousand/uL (ref 140–400)
RBC: 4.3 Million/uL (ref 3.80–5.10)
RDW: 13.1 % (ref 11.0–15.0)
WBC: 4.6 Thousand/uL (ref 3.8–10.8)

## 2023-09-29 LAB — PTH, INTACT (ICMA) AND IONIZED CALCIUM
Calcium, Ion: 5.1 mg/dL (ref 4.7–5.5)
Calcium: 9.2 mg/dL (ref 8.6–10.4)
PTH: 22 pg/mL (ref 16–77)

## 2023-09-29 LAB — TSH: TSH: 0.65 m[IU]/L (ref 0.40–4.50)

## 2023-10-02 ENCOUNTER — Other Ambulatory Visit: Payer: Self-pay | Admitting: *Deleted

## 2023-10-02 MED ORDER — ROMOSOZUMAB-AQQG 105 MG/1.17ML ~~LOC~~ SOSY
210.0000 mg | PREFILLED_SYRINGE | SUBCUTANEOUS | Status: AC
  Administered 2023-10-17 – 2023-12-18 (×3): 210 mg via SUBCUTANEOUS

## 2023-10-02 NOTE — Telephone Encounter (Signed)
 Routing to [place] to assist with Rx.

## 2023-10-17 ENCOUNTER — Ambulatory Visit

## 2023-10-17 DIAGNOSIS — M810 Age-related osteoporosis without current pathological fracture: Secondary | ICD-10-CM | POA: Diagnosis not present

## 2023-10-17 NOTE — Progress Notes (Signed)
 Evenity  # 7 - Both injections given in the left arm, per Pt's request. Pt tolerated both injections well.  (IC, CCMA)

## 2023-10-19 ENCOUNTER — Ambulatory Visit: Admitting: Podiatry

## 2023-10-19 ENCOUNTER — Ambulatory Visit (INDEPENDENT_AMBULATORY_CARE_PROVIDER_SITE_OTHER)

## 2023-10-19 ENCOUNTER — Encounter: Payer: Self-pay | Admitting: Podiatry

## 2023-10-19 VITALS — Ht 65.5 in | Wt 168.0 lb

## 2023-10-19 DIAGNOSIS — M21611 Bunion of right foot: Secondary | ICD-10-CM | POA: Diagnosis not present

## 2023-10-19 DIAGNOSIS — M21619 Bunion of unspecified foot: Secondary | ICD-10-CM

## 2023-10-19 DIAGNOSIS — M2041 Other hammer toe(s) (acquired), right foot: Secondary | ICD-10-CM

## 2023-10-19 DIAGNOSIS — M21612 Bunion of left foot: Secondary | ICD-10-CM

## 2023-10-19 NOTE — Progress Notes (Signed)
 Subjective:   Patient ID: Tina Juarez, female   DOB: 58 y.o.   MRN: [id number]   HPI Patient presents with severe bunion deformity right history of having the left corrected around 35 years ago and hammertoe deformity which is developed second left foot.  Patient states that this has been progressing and is increasingly hard to wear shoes and the toe is starting to go under the second toe secondary to the elevated second digit.  Patient does not smoke and likes to be active and is going to [place] for approximately 1 month over Christmas   Review of Systems  All other systems reviewed and are negative.       Objective:  Physical Exam Vitals and nursing note reviewed.  Constitutional:      Appearance: She is well-developed.  Pulmonary:     Effort: Pulmonary effort is normal.  Musculoskeletal:        General: Normal range of motion.  Skin:    General: Skin is warm.  Neurological:     Mental Status: She is alert.     Neurovascular status intact muscle strength adequate range of motion adequate with patient noted to have history of spontaneous dissection of the coronary artery x 2.  Is noted to have large bunion deformity right with deviation of the hallux pressing against the second toe with lifting of the second toe of a moderate rigid nature.  The joint is somewhat track bound and does have moderate reduction of motion but there was no crepitus within the joint with movement.  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  Patient does have significant elevation of the ankle and signs of arthritis so there is 2 schools of thought with 1 being consideration of Lap[place]us  procedure with lowering of the first metatarsal and removal of the bone spur or possible first MPJ fusion.  I will be referring to one of our physicians who does this type of work and I did educate her on this and the difference and she will be seen back with surgery to be done tentatively in January  X-rays indicate that there is significant elevation of the 1 2 intermetatarsal angle right well-healed structural site left with history of pin application with small spur on the dorsum of the first metatarsal head right foot

## 2023-11-08 ENCOUNTER — Encounter: Payer: Self-pay | Admitting: Obstetrics and Gynecology

## 2023-11-17 ENCOUNTER — Ambulatory Visit

## 2023-11-17 DIAGNOSIS — M810 Age-related osteoporosis without current pathological fracture: Secondary | ICD-10-CM | POA: Diagnosis not present

## 2023-11-17 NOTE — Progress Notes (Signed)
 Evenity  #8 - One injection given in each arm, as per Pt's request. (IC, CCMA)

## 2023-11-29 ENCOUNTER — Telehealth (HOSPITAL_BASED_OUTPATIENT_CLINIC_OR_DEPARTMENT_OTHER): Payer: Self-pay | Admitting: *Deleted

## 2023-11-29 NOTE — Telephone Encounter (Signed)
   Pre-operative Risk Assessment    Patient Name: Tina Juarez  DOB: 03/27/1965 MRN: [id number]   Date of last office visit: 10/05/22 DR. WONDA; ALSO THE PT IS BEING FOLLOWED BY [facility]; DR. SOO HYUN KIM  Date of next office visit: NONE   Request for Surgical Clearance    Procedure:  RIGHT FOOT BUNION CORRECTION WITH LAPIDUS ARTHRODESIS; RIGHT 2ND MT WEIL OSTEOTOMY, 2ND MTPj DORSAL CAPSULECTOMY; 2ND HAMMERTOE CORRECTION  Date of Surgery:  Clearance 02/01/24                                Surgeon:  DR. NORLEEN HEWITT  Surgeon's Group or Practice Name:  [facility] Phone number:  [phone] MEGAN DAVIS Fax number:  [phone]   Type of Clearance Requested:   - Medical  - Pharmacy:  Hold Aspirin      Type of Anesthesia:  General    Additional requests/questions:    [name]   11/29/2023, 4:13 PM

## 2023-11-29 NOTE — Telephone Encounter (Signed)
   Name: Tina Juarez  DOB: 10/19/1965  MRN: [id number]   Primary Cardiologist: Ozell Fell, MD  Chart reviewed as part of pre-operative protocol coverage.   This patient no longer follows with our practice for cardiology care. Pleaes reach out to her primary cardiologist at [facility].   I will route this recommendation to the requesting party via Epic fax function and remove from pre-op pool. Please call with questions.  Jon Garre Trenyce Loera, PA 11/29/2023, 4:25 PM

## 2023-12-04 ENCOUNTER — Other Ambulatory Visit: Payer: Self-pay | Admitting: Orthopedic Surgery

## 2023-12-05 ENCOUNTER — Ambulatory Visit

## 2023-12-14 ENCOUNTER — Encounter: Payer: Self-pay | Admitting: Cardiovascular Disease

## 2023-12-14 ENCOUNTER — Other Ambulatory Visit: Payer: Self-pay | Admitting: Cardiovascular Disease

## 2023-12-15 ENCOUNTER — Ambulatory Visit: Attending: Cardiovascular Disease | Admitting: Cardiovascular Disease

## 2023-12-15 ENCOUNTER — Encounter: Payer: Self-pay | Admitting: Cardiovascular Disease

## 2023-12-15 VITALS — BP 120/80 | HR 60 | Ht 66.0 in | Wt 169.2 lb

## 2023-12-15 DIAGNOSIS — I2542 Coronary artery dissection: Secondary | ICD-10-CM

## 2023-12-15 DIAGNOSIS — I773 Arterial fibromuscular dysplasia: Secondary | ICD-10-CM | POA: Diagnosis not present

## 2023-12-15 DIAGNOSIS — E782 Mixed hyperlipidemia: Secondary | ICD-10-CM

## 2023-12-15 DIAGNOSIS — I2510 Atherosclerotic heart disease of native coronary artery without angina pectoris: Secondary | ICD-10-CM | POA: Diagnosis not present

## 2023-12-15 MED ORDER — CARVEDILOL 3.125 MG PO TABS: 3.1250 mg | ORAL_TABLET | Freq: Two times a day (BID) | ORAL | 3 refills | Status: AC

## 2023-12-15 NOTE — Assessment & Plan Note (Signed)
 Continue aspirin  and carvedilol .  Follow-up 1 year.

## 2023-12-15 NOTE — Progress Notes (Signed)
 Cardiology Office Note:    Date:  12/15/2023   ID:  Cole, Klugh September 14, 1965, MRN [id number]  PCP:  Johnny Garnette LABOR, MD   [facility] Providers Cardiologist:  Ozell Fell, MD Cardiology APP:  Lelon Glendia ONEIDA DEVONNA     Referring MD: Johnny Garnette LABOR, MD   Chief Complaint  Patient presents with   Follow-up    Spontaneous coronary dissection    History of Present Illness:    Tina Juarez is a 58 y.o. 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  Mild beading was seen in the right internal carotid artery and left renal artery.  CTA surveillance is recommended.  The patient is here alone today.  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  Plans noted for annual CTA abdomen imaging.  The patient reports that she has felt well over the past year.  She feels like she tolerates carvedilol  without any significant side effects.  States that her energy level has stabilized and she has fewer lows.  She reports that she will need bunionectomy and hammertoe surgery in January.  Otherwise no significant health issues have occurred in the past year.  Current Medications: Active Medications[1]   Allergies:   Patient has no known allergies.   ROS:   Please see the history of present illness.    All other systems reviewed and are negative.  EKGs/Labs/Other Studies Reviewed:    The following studies were reviewed today: Cardiac  Studies & Procedures   ______________________________________________________________________________________________ CARDIAC CATHETERIZATION  CARDIAC CATHETERIZATION 12/20/2021  Conclusion Conclusions: Irregular tapering of apical LAD and distal segment of 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  The large/proximal coronary arteries are without significant disease. Low normal left ventricular systolic function with apical wall motion abnormality (LVEF 50-55%). Normal left ventricular filling pressure.  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  Continue indefinite aspirin  81 mg daily; defer P2Y12 inhibitor and IV heparin  in the setting of SCAD. Continue metoprolol , with dose escalation as heart rate and blood pressure allow.  Lonni Hanson, MD [facility]  Findings Coronary Findings Diagnostic  Dominance: Right  Left Main Vessel is moderate in size. Vessel is angiographically normal.  Left Anterior Descending Dist LAD lesion is 75% stenosed.  First Diagonal Branch Vessel is moderate in size.  Second Diagonal Branch Vessel is small in size.  Ramus Intermedius Vessel is small. Vessel is angiographically normal.  Left Circumflex Vessel is moderate in size.  First Obtuse Marginal Branch Vessel is small in size.  Second Obtuse Marginal Branch Vessel is small in size.  Left Posterior Atrioventricular Artery Vessel is small in size.  Right Coronary Artery Vessel is large. Vessel is angiographically normal.  Right Posterior Descending Artery Vessel is moderate in size.  Right Posterior Atrioventricular Artery Vessel is large in size.  First Right Posterolateral Branch Vessel  is small in size.  Second Right Posterolateral Branch Vessel is moderate in size. 2nd RPL lesion is 50% stenosed.  Third Right Posterolateral Branch Vessel is small in  size.  Intervention  No interventions have been documented.     ECHOCARDIOGRAM  ECHOCARDIOGRAM COMPLETE 03/31/2022  Narrative ECHOCARDIOGRAM REPORT    Patient Name:   Tina Juarez [facility] Date of Exam: 03/31/2022 Medical Rec #:  [id number]               Height:       67.0 in Accession #:    [id number]              Weight:       170.0 lb Date of Birth:  11-18-1965               BSA:          1.887 m Patient Age:    56 years                BP:           124/80 mmHg Patient Gender: F                       HR:           65 bpm. Exam Location:  [address]  Procedure: 2D Echo, Cardiac Doppler, Color Doppler and Intracardiac Opacification Agent  Indications:    I25.42 Spontaneous dissection of coronary artery  History:        Patient has prior history of Echocardiogram examinations, most recent 12/20/2021. Previous Myocardial Infarction and CAD, Abnormal ECG, Signs/Symptoms:Chest Pain; Risk Factors:Dyslipidemia.  Sonographer:    Jon Hacker RCS Referring Phys: [phone] Auguste Tebbetts  IMPRESSIONS   1. Left ventricular ejection fraction, by estimation, is 60 to 65%. The left ventricle has normal function. The left ventricle has no regional wall motion abnormalities. Left ventricular diastolic parameters are consistent with Grade I diastolic dysfunction (impaired relaxation). 2. Right ventricular systolic function is normal. The right ventricular size is normal. 3. No evidence of mitral valve regurgitation. 4. Aortic valve regurgitation is not visualized. 5. The inferior vena cava is normal in size with greater than 50% respiratory variability, suggesting right atrial pressure of 3 mmHg.  FINDINGS Left Ventricle: Left ventricular ejection fraction, by estimation, is 60 to 65%. The left ventricle has normal function. The left ventricle has no regional wall motion abnormalities. The left ventricular internal cavity size was normal in size. There is no left ventricular  hypertrophy. Left ventricular diastolic parameters are consistent with Grade I diastolic dysfunction (impaired relaxation).  Right Ventricle: The right ventricular size is normal. Right ventricular systolic function is normal.  Left Atrium: Left atrial size was normal in size.  Right Atrium: Right atrial size was normal in size.  Pericardium: There is no evidence of pericardial effusion.  Mitral Valve: No evidence of mitral valve regurgitation.  Tricuspid Valve: Tricuspid valve regurgitation is trivial.  Aortic Valve: Aortic valve regurgitation is not visualized.  Pulmonic Valve: Pulmonic valve regurgitation is not visualized.  Aorta: The aortic root and ascending aorta are structurally normal, with no evidence of dilitation.  Venous: The inferior vena cava is normal in size with greater than 50% respiratory variability, suggesting right atrial pressure of 3 mmHg.  IAS/Shunts: No atrial level shunt detected by color flow Doppler.   LEFT VENTRICLE PLAX 2D LVIDd:         4.90 cm   Diastology LVIDs:  3.10 cm   LV e' medial:    7.07 cm/s LV PW:         0.60 cm   LV E/e' medial:  10.7 LV IVS:        0.70 cm   LV e' lateral:   6.74 cm/s LVOT diam:     1.90 cm   LV E/e' lateral: 11.2 LV SV:         71 LV SV Index:   38 LVOT Area:     2.84 cm   RIGHT VENTRICLE RV Basal diam:  2.70 cm RV Mid diam:    1.80 cm RV S prime:     12.00 cm/s TAPSE (M-mode): 2.0 cm RVSP:           15.1 mmHg  LEFT ATRIUM             Index        RIGHT ATRIUM           Index LA diam:        3.50 cm 1.85 cm/m   RA Pressure: 3.00 mmHg LA Vol (A2C):   33.7 ml 17.86 ml/m  RA Area:     13.30 cm LA Vol (A4C):   37.5 ml 19.87 ml/m  RA Volume:   30.70 ml  16.27 ml/m LA Biplane Vol: 36.8 ml 19.50 ml/m AORTIC VALVE LVOT Vmax:   116.00 cm/s LVOT Vmean:  68.900 cm/s LVOT VTI:    0.251 m  AORTA Ao Root diam: 3.10 cm Ao Asc diam:  3.40 cm  MITRAL VALVE               TRICUSPID VALVE MV Area  (PHT): 3.70 cm    TR Peak grad:   12.1 mmHg MV Decel Time: 205 msec    TR Vmax:        174.00 cm/s MV E velocity: 75.80 cm/s  Estimated RAP:  3.00 mmHg MV A velocity: 84.80 cm/s  RVSP:           15.1 mmHg MV E/A ratio:  0.89 SHUNTS Systemic VTI:  0.25 m Systemic Diam: 1.90 cm  Ronal Ross Electronically signed by Ronal Ross Signature Date/Time: 03/31/2022/3:10:28 PM    Final    MONITORS  CARDIAC EVENT MONITOR 05/04/2020  Narrative 1. The basic rhythm is normal sinus with an average HR of 72 bpm 2. No atrial fibrillation or flutter 3. No high-grade heart block or pathologic pauses 4. There are rare PVC's and rare supraventricular beats without sustained arrhythmias. Few short supraventricular runs noted (3-4 beats)  Overall benign monitor result       ______________________________________________________________________________________________      EKG:   EKG Interpretation Date/Time:  Friday December 15 2023 11:48:37 EST Ventricular Rate:  60 PR Interval:  170 QRS Duration:  72 QT Interval:  386 QTC Calculation: 386 R Axis:   -40  Text Interpretation: Normal sinus rhythm Left axis deviation Low voltage QRS Nonspecific T wave abnormality When compared with ECG of 19-Dec-2021 17:25, Minimal criteria for Anterior infarct are no longer Present QT has shortened Confirmed by Wonda Sharper [phone]) on 12/15/2023 11:54:08 AM    Recent Labs: 09/28/2023: ALT 15; BUN 20; Creat 0.73; Hemoglobin 13.9; Platelets 276; Potassium 4.4; Sodium 138; TSH 0.65  Recent Lipid Panel    Component Value Date/Time   CHOL 127 03/25/2022 0804   TRIG 61 03/25/2022 0804   HDL 57 03/25/2022 0804   CHOLHDL 2.2 03/25/2022 0804   CHOLHDL 3.0 12/20/2021 0451  VLDL 6 12/20/2021 0451   LDLCALC 57 03/25/2022 0804     Risk Assessment/Calculations:                Physical Exam:    VS:  BP 120/80 (BP Location: Right Arm, Patient Position: Sitting, Cuff Size: Normal)   Pulse 60   Ht 5'  6 (1.676 m)   Wt 169 lb 3.2 oz (76.7 kg)   SpO2 96%   BMI 27.31 kg/m     Wt Readings from Last 3 Encounters:  12/15/23 169 lb 3.2 oz (76.7 kg)  10/19/23 168 lb (76.2 kg)  09/28/23 168 lb (76.2 kg)     GEN:  Well nourished, well developed in no acute distress HEENT: Normal NECK: No JVD; No carotid bruits LYMPHATICS: No lymphadenopathy CARDIAC: RRR, no murmurs, rubs, gallops RESPIRATORY:  Clear to auscultation without rales, wheezing or rhonchi  ABDOMEN: Soft, non-tender, non-distended MUSCULOSKELETAL:  No edema; No deformity  SKIN: Warm and dry NEUROLOGIC:  Alert and oriented x 3 PSYCHIATRIC:  Normal affect   Assessment & Plan Coronary artery disease involving native coronary artery of native heart without angina pectoris Continue aspirin  and carvedilol .  Follow-up 1 year. Mixed hyperlipidemia Lipids are mildly elevated with an LDL of 114.  She has no atherosclerotic disease.  Continue with healthy lifestyle. Coronary artery dissection History of recurrent scad.  Clinically stable.  FMD follow-up in [place] with annual CTA imaging.  Continue carvedilol  and aspirin .  Plans noted for upcoming foot surgery.  Patient is at low risk of cardiac complications and can hold aspirin  5 to 7 days prior to surgery if needed. Fibromuscular dysplasia Followed by Dr. Luke in [place].  She will have her annual CTA imaging and follow-up performed this coming spring to follow her renal artery aneurysm and fibromuscular dysplasia.      Medication Adjustments/Labs and Tests Ordered: Current medicines are reviewed at length with the patient today.  Concerns regarding medicines are outlined above.  Orders Placed This Encounter  Procedures   EKG 12-Lead   No orders of the defined types were placed in this encounter.   Patient Instructions  Medication Instructions:  Your physician recommends that you continue on your current medications as directed. Please refer to the Current Medication  list given to you today.  *If you need a refill on your cardiac medications before your next appointment, please call your pharmacy*  Lab Work: NONE If you have labs (blood work) drawn today and your tests are completely normal, you will receive your results only by: MyChart Message (if you have MyChart) OR A paper copy in the mail If you have any lab test that is abnormal or we need to change your treatment, we will call you to review the results.  Testing/Procedures: NONE  Follow-Up: At [facility], you and your health needs are our priority.  As part of our continuing mission to provide you with exceptional heart care, our providers are all part of one team.  This team includes your primary Cardiologist (physician) and Advanced Practice Providers or APPs (Physician Assistants and Nurse Practitioners) who all work together to provide you with the care you need, when you need it.  Your next appointment:   1 year(s)  Provider:   Ozell Fell, MD    We recommend signing up for the patient portal called MyChart.  Sign up information is provided on this After Visit Summary.  MyChart is used to connect with patients for Virtual Visits (Telemedicine).  Patients  are able to view lab/test results, encounter notes, upcoming appointments, etc.  Non-urgent messages can be sent to your provider as well.   To learn more about what you can do with MyChart, go to [email or web address].        Signed, Ozell Fell, MD  12/15/2023 12:12 PM     HeartCare     [1]  Current Meds  Medication Sig   acetaminophen  (TYLENOL ) 500 MG tablet Take 1,000 mg by mouth every 6 (six) hours as needed for mild pain.   aspirin  81 MG tablet Take 1 tablet (81 mg total) by mouth daily.   calcium  carbonate 100 mg/ml SUSP Take 1,200 mg by mouth.   carvedilol  (COREG ) 3.125 MG tablet TAKE 1 TABLET(3.125 MG) BY MOUTH TWICE DAILY WITH A MEAL   nitroGLYCERIN  (NITROSTAT ) 0.4 MG SL tablet  Place 1 tablet (0.4 mg total) under the tongue every 5 (five) minutes x 3 doses as needed for chest pain.   Romosozumab -aqqg (EVENITY  Beacon) Inject into the skin every 30 (thirty) days.   traZODone  (DESYREL ) 50 MG tablet TAKE 1 TABLET(50 MG) BY MOUTH AT BEDTIME AS NEEDED FOR SLEEP (Patient taking differently: Take 50 mg by mouth as needed.)   UNABLE TO FIND 1 Device by Does not apply route daily. Osteobelt use as directed for osteoporosis treatment   Current Facility-Administered Medications for the 12/15/23 encounter (Office Visit) with Fell Ozell, MD  Medication   Romosozumab -aqqg (EVENITY ) 105 MG/1. injection 210 mg

## 2023-12-15 NOTE — Assessment & Plan Note (Addendum)
 History of recurrent scad.  Clinically stable.  FMD follow-up in [place] with annual CTA imaging.  Continue carvedilol  and aspirin .  Plans noted for upcoming foot surgery.  Patient is at low risk of cardiac complications and can hold aspirin  5 to 7 days prior to surgery if needed.

## 2023-12-15 NOTE — Assessment & Plan Note (Signed)
 Lipids are mildly elevated with an LDL of 114.  She has no atherosclerotic disease.  Continue with healthy lifestyle.

## 2023-12-15 NOTE — Patient Instructions (Signed)
 Medication Instructions:  Your physician recommends that you continue on your current medications as directed. Please refer to the Current Medication list given to you today.  *If you need a refill on your cardiac medications before your next appointment, please call your pharmacy*  Lab Work: NONE If you have labs (blood work) drawn today and your tests are completely normal, you will receive your results only by: MyChart Message (if you have MyChart) OR A paper copy in the mail If you have any lab test that is abnormal or we need to change your treatment, we will call you to review the results.  Testing/Procedures: NONE  Follow-Up: At [facility], you and your health needs are our priority.  As part of our continuing mission to provide you with exceptional heart care, our providers are all part of one team.  This team includes your primary Cardiologist (physician) and Advanced Practice Providers or APPs (Physician Assistants and Nurse Practitioners) who all work together to provide you with the care you need, when you need it.  Your next appointment:   1 year(s)  Provider:   Arnoldo Lapping, MD    We recommend signing up for the patient portal called "MyChart".  Sign up information is provided on this After Visit Summary.  MyChart is used to connect with patients for Virtual Visits (Telemedicine).  Patients are able to view lab/test results, encounter notes, upcoming appointments, etc.  Non-urgent messages can be sent to your provider as well.   To learn more about what you can do with MyChart, go to [email or web address].

## 2023-12-18 ENCOUNTER — Ambulatory Visit

## 2023-12-18 DIAGNOSIS — M810 Age-related osteoporosis without current pathological fracture: Secondary | ICD-10-CM

## 2023-12-18 NOTE — Progress Notes (Signed)
 9th Evenity  injection given (SQ) on 12/18/23. One in each arm, per Pt's request. (IC)

## 2024-01-10 ENCOUNTER — Other Ambulatory Visit: Payer: Self-pay | Admitting: *Deleted

## 2024-01-10 ENCOUNTER — Encounter: Payer: Self-pay | Admitting: *Deleted

## 2024-01-10 DIAGNOSIS — M8080XA Other osteoporosis with current pathological fracture, unspecified site, initial encounter for fracture: Secondary | ICD-10-CM

## 2024-01-10 DIAGNOSIS — M810 Age-related osteoporosis without current pathological fracture: Secondary | ICD-10-CM

## 2024-01-10 MED ORDER — ROMOSOZUMAB-AQQG 105 MG/1.17ML ~~LOC~~ SOSY
210.0000 mg | PREFILLED_SYRINGE | SUBCUTANEOUS | Status: AC
  Administered 2024-01-23: 210 mg via SUBCUTANEOUS

## 2024-01-12 ENCOUNTER — Other Ambulatory Visit (HOSPITAL_COMMUNITY): Payer: Self-pay

## 2024-01-12 ENCOUNTER — Telehealth: Payer: Self-pay

## 2024-01-12 NOTE — Telephone Encounter (Signed)
 Evenity  VOB initiated via [email or web address]  Last OV:  Next OV:  Last Evenity  inj: 12/18/23 Next Evenity  inj DUE: 01/18/24

## 2024-01-18 ENCOUNTER — Other Ambulatory Visit (HOSPITAL_COMMUNITY): Payer: Self-pay

## 2024-01-18 NOTE — Telephone Encounter (Signed)
 Tina Juarez

## 2024-01-18 NOTE — Telephone Encounter (Signed)
 See referral

## 2024-01-18 NOTE — Telephone Encounter (Signed)
 Buy/Bill (Office supplied medication)  Out-of-pocket cost due at time of clinic visit: $160  Number of injection/visits approved: ---  Primary: [employer] Co-insurance: $80 Admin fee co-insurance: $80  Secondary: --- Co-insurance:  Admin fee co-insurance:   Medical Benefit Details: Date Benefits were checked: 01/17/24 Deductible: NO/ Coinsurance: $80/ Admin Fee: $80  Prior Auth: APPROVED PA# [id number] Expiration Date: 02/23/23-02/23/24   # of doses approved: 12 -----------------------------------------------------------------------  Patient IS eligible for Copay Card. Copay Card can make patient's cost as little as $25. Link to apply: https://www.amgensupportplus.com/copay  ** This summary of benefits is an estimation of the patient's out-of-pocket cost. Exact cost may very based on individual plan coverage.

## 2024-01-23 ENCOUNTER — Ambulatory Visit (INDEPENDENT_AMBULATORY_CARE_PROVIDER_SITE_OTHER)

## 2024-01-23 DIAGNOSIS — M8080XA Other osteoporosis with current pathological fracture, unspecified site, initial encounter for fracture: Secondary | ICD-10-CM | POA: Diagnosis not present

## 2024-01-25 ENCOUNTER — Other Ambulatory Visit: Payer: Self-pay

## 2024-01-25 ENCOUNTER — Encounter (HOSPITAL_BASED_OUTPATIENT_CLINIC_OR_DEPARTMENT_OTHER): Payer: Self-pay | Admitting: Orthopedic Surgery

## 2024-01-25 NOTE — Progress Notes (Signed)

## 2024-01-31 NOTE — Progress Notes (Signed)
 Notified patient that he/she needs to stop by [facility] between 7am-3pm for lab-work and/or pre-surgery drink or soap. Contact number included for any questions.

## 2024-02-01 ENCOUNTER — Other Ambulatory Visit: Payer: Self-pay

## 2024-02-01 ENCOUNTER — Ambulatory Visit (HOSPITAL_BASED_OUTPATIENT_CLINIC_OR_DEPARTMENT_OTHER): Admitting: Certified Registered Nurse Anesthetist

## 2024-02-01 ENCOUNTER — Encounter (HOSPITAL_BASED_OUTPATIENT_CLINIC_OR_DEPARTMENT_OTHER)
Admission: RE | Disposition: A | Discharge status: Home / Self Care | Payer: Self-pay | Source: Home / Self Care | Attending: Orthopedic Surgery

## 2024-02-01 ENCOUNTER — Encounter (HOSPITAL_BASED_OUTPATIENT_CLINIC_OR_DEPARTMENT_OTHER): Payer: Self-pay | Admitting: Orthopedic Surgery

## 2024-02-01 ENCOUNTER — Ambulatory Visit (HOSPITAL_BASED_OUTPATIENT_CLINIC_OR_DEPARTMENT_OTHER)

## 2024-02-01 ENCOUNTER — Ambulatory Visit (HOSPITAL_BASED_OUTPATIENT_CLINIC_OR_DEPARTMENT_OTHER)
Admission: RE | Admit: 2024-02-01 | Discharge: 2024-02-01 | Disposition: A | Discharge status: Home / Self Care | Attending: Orthopedic Surgery | Admitting: Orthopedic Surgery

## 2024-02-01 DIAGNOSIS — Z01818 Encounter for other preprocedural examination: Secondary | ICD-10-CM

## 2024-02-01 DIAGNOSIS — M21611 Bunion of right foot: Secondary | ICD-10-CM | POA: Diagnosis not present

## 2024-02-01 DIAGNOSIS — I2510 Atherosclerotic heart disease of native coronary artery without angina pectoris: Secondary | ICD-10-CM | POA: Insufficient documentation

## 2024-02-01 DIAGNOSIS — M24574 Contracture, right foot: Secondary | ICD-10-CM | POA: Insufficient documentation

## 2024-02-01 DIAGNOSIS — M7741 Metatarsalgia, right foot: Secondary | ICD-10-CM | POA: Insufficient documentation

## 2024-02-01 DIAGNOSIS — I252 Old myocardial infarction: Secondary | ICD-10-CM | POA: Insufficient documentation

## 2024-02-01 DIAGNOSIS — M2041 Other hammer toe(s) (acquired), right foot: Secondary | ICD-10-CM | POA: Insufficient documentation

## 2024-02-01 MED ORDER — FENTANYL CITRATE (PF) 250 MCG/5ML IJ SOLN
INTRAMUSCULAR | Status: DC | PRN
  Administered 2024-02-01: 25 ug via INTRAVENOUS
  Administered 2024-02-01: 50 ug via INTRAVENOUS
  Administered 2024-02-01: 25 ug via INTRAVENOUS

## 2024-02-01 MED ORDER — PROPOFOL 500 MG/50ML IV EMUL
INTRAVENOUS | Status: AC
  Filled 2024-02-01: qty 50

## 2024-02-01 MED ORDER — OXYCODONE HCL 5 MG/5ML PO SOLN: 5.0000 mg | Freq: Once | ORAL | Status: DC | PRN

## 2024-02-01 MED ORDER — OXYCODONE HCL 5 MG PO TABS: 5.0000 mg | ORAL_TABLET | Freq: Four times a day (QID) | ORAL | 0 refills | Status: AC | PRN

## 2024-02-01 MED ORDER — FENTANYL CITRATE (PF) 100 MCG/2ML IJ SOLN
INTRAMUSCULAR | Status: AC
  Filled 2024-02-01: qty 2

## 2024-02-01 MED ORDER — MIDAZOLAM HCL 2 MG/2ML IJ SOLN
INTRAMUSCULAR | Status: AC
  Filled 2024-02-01: qty 2

## 2024-02-01 MED ORDER — PROPOFOL 10 MG/ML IV BOLUS
INTRAVENOUS | Status: AC
  Filled 2024-02-01: qty 20

## 2024-02-01 MED ORDER — PROPOFOL 500 MG/50ML IV EMUL
INTRAVENOUS | Status: DC | PRN
  Administered 2024-02-01: 75 ug/kg/min via INTRAVENOUS

## 2024-02-01 MED ORDER — CEFAZOLIN SODIUM-DEXTROSE 2-4 GM/100ML-% IV SOLN
INTRAVENOUS | Status: AC
  Filled 2024-02-01: qty 100

## 2024-02-01 MED ORDER — DEXAMETHASONE SOD PHOSPHATE PF 10 MG/ML IJ SOLN
INTRAMUSCULAR | Status: AC
  Filled 2024-02-01: qty 3

## 2024-02-01 MED ORDER — ARTIFICIAL TEARS OPHTHALMIC OINT
TOPICAL_OINTMENT | OPHTHALMIC | Status: AC
  Filled 2024-02-01: qty 7

## 2024-02-01 MED ORDER — ONDANSETRON HCL 4 MG/2ML IJ SOLN
INTRAMUSCULAR | Status: AC
  Filled 2024-02-01: qty 6

## 2024-02-01 MED ORDER — ONDANSETRON HCL 4 MG/2ML IJ SOLN
INTRAMUSCULAR | Status: DC | PRN
  Administered 2024-02-01: 4 mg via INTRAVENOUS

## 2024-02-01 MED ORDER — 0.9 % SODIUM CHLORIDE (POUR BTL) OPTIME
TOPICAL | Status: DC | PRN
  Administered 2024-02-01: 500 mL

## 2024-02-01 MED ORDER — FENTANYL CITRATE (PF) 100 MCG/2ML IJ SOLN: 25.0000 ug | INTRAMUSCULAR | Status: DC | PRN

## 2024-02-01 MED ORDER — MIDAZOLAM HCL (PF) 2 MG/2ML IJ SOLN
2.0000 mg | Freq: Once | INTRAMUSCULAR | Status: AC
  Administered 2024-02-01: 2 mg via INTRAVENOUS

## 2024-02-01 MED ORDER — EPHEDRINE 5 MG/ML INJ
INTRAVENOUS | Status: AC
  Filled 2024-02-01: qty 15

## 2024-02-01 MED ORDER — LACTATED RINGERS IV SOLN: INTRAVENOUS | Status: DC

## 2024-02-01 MED ORDER — ACETAMINOPHEN 500 MG PO TABS: 1000.0000 mg | ORAL_TABLET | Freq: Once | ORAL | Status: DC

## 2024-02-01 MED ORDER — OXYCODONE HCL 5 MG PO TABS: 5.0000 mg | ORAL_TABLET | Freq: Once | ORAL | Status: DC | PRN

## 2024-02-01 MED ORDER — PROPOFOL 10 MG/ML IV BOLUS
INTRAVENOUS | Status: DC | PRN
  Administered 2024-02-01: 20 mg via INTRAVENOUS
  Administered 2024-02-01: 150 mg via INTRAVENOUS

## 2024-02-01 MED ORDER — CEFAZOLIN SODIUM-DEXTROSE 2-4 GM/100ML-% IV SOLN: 2.0000 g | INTRAVENOUS | Status: DC

## 2024-02-01 MED ORDER — ACETAMINOPHEN 10 MG/ML IV SOLN
INTRAVENOUS | Status: DC | PRN
  Administered 2024-02-01: 1000 mg via INTRAVENOUS

## 2024-02-01 MED ORDER — LIDOCAINE 2% (20 MG/ML) 5 ML SYRINGE
INTRAMUSCULAR | Status: DC | PRN
  Administered 2024-02-01: 40 mg via INTRAVENOUS

## 2024-02-01 MED ORDER — DEXAMETHASONE SOD PHOSPHATE PF 10 MG/ML IJ SOLN
INTRAMUSCULAR | Status: DC | PRN
  Administered 2024-02-01: 10 mg via INTRAVENOUS

## 2024-02-01 MED ORDER — EPHEDRINE SULFATE-NACL 50-0.9 MG/10ML-% IV SOSY
PREFILLED_SYRINGE | INTRAVENOUS | Status: DC | PRN
  Administered 2024-02-01 (×3): 5 mg via INTRAVENOUS

## 2024-02-01 MED ORDER — FENTANYL CITRATE (PF) 100 MCG/2ML IJ SOLN
50.0000 ug | Freq: Once | INTRAMUSCULAR | Status: AC
  Administered 2024-02-01: 50 ug via INTRAVENOUS

## 2024-02-01 MED ORDER — DROPERIDOL 2.5 MG/ML IJ SOLN: 0.6250 mg | Freq: Once | INTRAMUSCULAR | Status: DC | PRN

## 2024-02-01 MED ORDER — ACETAMINOPHEN 10 MG/ML IV SOLN
INTRAVENOUS | Status: AC
  Filled 2024-02-01: qty 100

## 2024-02-01 MED ORDER — VANCOMYCIN HCL 500 MG IV SOLR
INTRAVENOUS | Status: DC | PRN
  Administered 2024-02-01: 500 mg via TOPICAL

## 2024-02-01 MED ORDER — EPHEDRINE 5 MG/ML INJ
INTRAVENOUS | Status: AC
  Filled 2024-02-01: qty 5

## 2024-02-01 MED ORDER — LIDOCAINE 2% (20 MG/ML) 5 ML SYRINGE
INTRAMUSCULAR | Status: AC
  Filled 2024-02-01: qty 15

## 2024-02-01 MED ORDER — DEXMEDETOMIDINE HCL IN NACL 80 MCG/20ML IV SOLN
INTRAVENOUS | Status: AC
  Filled 2024-02-01: qty 20

## 2024-02-01 MED ORDER — ASPIRIN 81 MG PO TABS: 81.0000 mg | ORAL_TABLET | Freq: Two times a day (BID) | ORAL | 0 refills | Status: AC

## 2024-02-01 NOTE — Anesthesia Postprocedure Evaluation (Signed)
"   Anesthesia Post Note  Patient: Tina Juarez  Procedure(s) Performed: Right foot bunion correction with lapidus arthrodesis (Right: Foot) Right second metatarsal weil osteotomy; second metatarsalphalangeal dorsal capsulotomy (Right: Toe) Second hammertoe correction (Right: Toe)     Patient location during evaluation: PACU Anesthesia Type: General Level of consciousness: awake and alert Pain management: pain level controlled Vital Signs Assessment: post-procedure vital signs reviewed and stable Respiratory status: spontaneous breathing, nonlabored ventilation and respiratory function stable Cardiovascular status: blood pressure returned to baseline Postop Assessment: no apparent nausea or vomiting Anesthetic complications: no   No notable events documented.  Last Vitals:  Vitals:   02/01/24 1130 02/01/24 1149  BP: 113/64 115/64  Pulse: (!) 54 61  Resp: 13 18  Temp:  (!) 36.2 C  SpO2: 99% 98%    Last Pain:  Vitals:   02/01/24 1149  TempSrc:   PainSc: 0-No pain                 Vertell Row      "

## 2024-02-01 NOTE — Discharge Instructions (Addendum)
 Norleen Armor, MD [facility]  Please read the following information regarding your care after surgery.  Medications  You only need a prescription for the narcotic pain medicine (ex. oxycodone , Percocet, Norco).  All of the other medicines listed below are available over the counter. X Aleve 2 pills twice a day for the first 3 days after surgery if needed X acetominophen (Tylenol ) 650 mg every 4-6 hours as you need for minor to moderate pain X oxycodone  as prescribed for severe pain  Narcotic pain medicine (ex. oxycodone , Percocet, Vicodin) will cause constipation.  To prevent this problem, take the following medicines while you are taking any pain medicine. X docusate sodium  (Colace) 100 mg twice a day X senna (Senokot) 2 tablets twice a day  X To help prevent blood clots, take a baby aspirin  (81 mg) twice a day for two weeks after surgery.  You should also get up every hour while you are awake to move around.    Weight Bearing X Do not bear any weight on the operated leg or foot.  Cast / Splint / Dressing X Keep your splint, cast or dressing clean and dry.  Dont put anything (coat hanger, pencil, etc) down inside of it.  If it gets damp, use a hair dryer on the cool setting to dry it.  If it gets soaked, call the office to schedule an appointment for a cast change.  After your dressing, cast or splint is removed; you may shower, but do not soak or scrub the wound.  Allow the water to run over it, and then gently pat it dry.  Swelling It is normal for you to have swelling where you had surgery.  To reduce swelling and pain, keep your toes above your nose for at least 3 days after surgery.  It may be necessary to keep your foot or leg elevated for several weeks.  If it hurts, it should be elevated.  Follow Up Call my office at [phone] when you are discharged from the hospital or surgery center to schedule an appointment to be seen two weeks after surgery.  Call my office at  [phone] if you develop a fever >101.5 F, nausea, vomiting, bleeding from the surgical site or severe pain.      Post Anesthesia Home Care Instructions  Activity: Get plenty of rest for the remainder of the day. A responsible individual must stay with you for 24 hours following the procedure.  For the next 24 hours, DO NOT: -Drive a car -Advertising copywriter -Drink alcoholic beverages -Take any medication unless instructed by your physician -Make any legal decisions or sign important papers.  Meals: Start with liquid foods such as gelatin or soup. Progress to regular foods as tolerated. Avoid greasy, spicy, heavy foods. If nausea and/or vomiting occur, drink only clear liquids until the nausea and/or vomiting subsides. Call your physician if vomiting continues.  Special Instructions/Symptoms: Your throat may feel dry or sore from the anesthesia or the breathing tube placed in your throat during surgery. If this causes discomfort, gargle with warm salt water. The discomfort should disappear within 24 hours.  If you had a scopolamine patch placed behind your ear for the management of post- operative nausea and/or vomiting:  1. The medication in the patch is effective for 72 hours, after which it should be removed.  Wrap patch in a tissue and discard in the trash. Wash hands thoroughly with soap and water. 2. You may remove the patch earlier than 72 hours if  you experience unpleasant side effects which may include dry mouth, dizziness or visual disturbances. 3. Avoid touching the patch. Wash your hands with soap and water after contact with the patch.    **No Tylenol  until after 4:00 pm  Post Anesthesia Home Care Instructions  Activity: Get plenty of rest for the remainder of the day. A responsible individual must stay with you for 24 hours following the procedure.  For the next 24 hours, DO NOT: -Drive a car -Advertising copywriter -Drink alcoholic beverages -Take any medication unless  instructed by your physician -Make any legal decisions or sign important papers.  Meals: Start with liquid foods such as gelatin or soup. Progress to regular foods as tolerated. Avoid greasy, spicy, heavy foods. If nausea and/or vomiting occur, drink only clear liquids until the nausea and/or vomiting subsides. Call your physician if vomiting continues.  Special Instructions/Symptoms: Your throat may feel dry or sore from the anesthesia or the breathing tube placed in your throat during surgery. If this causes discomfort, gargle with warm salt water. The discomfort should disappear within 24 hours.  If you had a scopolamine patch placed behind your ear for the management of post- operative nausea and/or vomiting:  1. The medication in the patch is effective for 72 hours, after which it should be removed.  Wrap patch in a tissue and discard in the trash. Wash hands thoroughly with soap and water. 2. You may remove the patch earlier than 72 hours if you experience unpleasant side effects which may include dry mouth, dizziness or visual disturbances. 3. Avoid touching the patch. Wash your hands with soap and water after contact with the patch.     Post Anesthesia Home Care Instructions  Activity: Get plenty of rest for the remainder of the day. A responsible individual must stay with you for 24 hours following the procedure.  For the next 24 hours, DO NOT: -Drive a car -Advertising copywriter -Drink alcoholic beverages -Take any medication unless instructed by your physician -Make any legal decisions or sign important papers.  Meals: Start with liquid foods such as gelatin or soup. Progress to regular foods as tolerated. Avoid greasy, spicy, heavy foods. If nausea and/or vomiting occur, drink only clear liquids until the nausea and/or vomiting subsides. Call your physician if vomiting continues.  Special Instructions/Symptoms: Your throat may feel dry or sore from the anesthesia or the  breathing tube placed in your throat during surgery. If this causes discomfort, gargle with warm salt water. The discomfort should disappear within 24 hours.  If you had a scopolamine patch placed behind your ear for the management of post- operative nausea and/or vomiting:  1. The medication in the patch is effective for 72 hours, after which it should be removed.  Wrap patch in a tissue and discard in the trash. Wash hands thoroughly with soap and water. 2. You may remove the patch earlier than 72 hours if you experience unpleasant side effects which may include dry mouth, dizziness or visual disturbances. 3. Avoid touching the patch. Wash your hands with soap and water after contact with the patch.     Regional Anesthesia Blocks  1. You may not be able to move or feel the blocked extremity after a regional anesthetic block. This may last may last from 3-48 hours after placement, but it will go away. The length of time depends on the medication injected and your individual response to the medication. As the nerves start to wake up, you may experience tingling as  the movement and feeling returns to your extremity. If the numbness and inability to move your extremity has not gone away after 48 hours, please call your surgeon.   2. The extremity that is blocked will need to be protected until the numbness is gone and the strength has returned. Because you cannot feel it, you will need to take extra care to avoid injury. Because it may be weak, you may have difficulty moving it or using it. You may not know what position it is in without looking at it while the block is in effect.  3. For blocks in the legs and feet, returning to weight bearing and walking needs to be done carefully. You will need to wait until the numbness is entirely gone and the strength has returned. You should be able to move your leg and foot normally before you try and bear weight or walk. You will need someone to be with you  when you first try to ensure you do not fall and possibly risk injury.  4. Bruising and tenderness at the needle site are common side effects and will resolve in a few days.  5. Persistent numbness or new problems with movement should be communicated to the surgeon or the [facility] [phone] [facility] [phone]).  Information for Discharge Teaching: EXPAREL (bupivacaine liposome injectable suspension)   Pain relief is important to your recovery. The goal is to control your pain so you can move easier and return to your normal activities as soon as possible after your procedure. Your physician may use several types of medicines to manage pain, swelling, and more.  Your surgeon or anesthesiologist gave you EXPAREL(bupivacaine) to help control your pain after surgery.  EXPAREL is a local anesthetic designed to release slowly over an extended period of time to provide pain relief by numbing the tissue around the surgical site. EXPAREL is designed to release pain medication over time and can control pain for up to 72 hours. Depending on how you respond to EXPAREL, you may require less pain medication during your recovery. EXPAREL can help reduce or eliminate the need for opioids during the first few days after surgery when pain relief is needed the most. EXPAREL is not an opioid and is not addictive. It does not cause sleepiness or sedation.   Important! A teal colored band has been placed on your arm with the date, time and amount of EXPAREL you have received. Please leave this armband in place for the full 96 hours following administration, and then you may remove the band. If you return to the hospital for any reason within 96 hours following the administration of EXPAREL, the armband provides important information that your health care providers to know, and alerts them that you have received this anesthetic.    Possible side effects of EXPAREL: Temporary  loss of sensation or ability to move in the area where medication was injected. Nausea, vomiting, constipation Rarely, numbness and tingling in your mouth or lips, lightheadedness, or anxiety may occur. Call your doctor right away if you think you may be experiencing any of these sensations, or if you have other questions regarding possible side effects.  Follow all other discharge instructions given to you by your surgeon or nurse. Eat a healthy diet and drink plenty of water or other fluids.

## 2024-02-01 NOTE — Op Note (Signed)
 02/01/2024  10:54 AM  PATIENT:  Tina Juarez  59 y.o. female  PRE-OPERATIVE DIAGNOSIS:  1.  Right foot bunion deformity 2.  Right second hammertoe deformity 3.  Right second MTP joint dorsal contracture 4.  Right forefoot metatarsalgia  POST-OPERATIVE DIAGNOSIS: Same  Procedures: 1.  Right foot bunion correction with Lapidus arthrodesis of the first TMT joint 2.  Right second metatarsal MIS distal oblique osteotomy 3.  Correction of right second hammertoe deformity 4.  Right second MTP joint dorsal capsulotomy and extensor tendon lengthening 5.  Right foot AP, lateral and oblique radiographs  SURGEON:  Norleen Armor, MD  ASSISTANT: Dickey Sales, PA-C  ANESTHESIA:   General, regional  EBL:  minimal   TOURNIQUET:   Total Tourniquet Time Documented: Thigh (Right) - 46 minutes Total: Thigh (Right) - 46 minutes  COMPLICATIONS:  None apparent  DISPOSITION:  Extubated, awake and stable to recovery.  INDICATION FOR PROCEDURE: 59 year old woman without significant past medical history complains of worsening right forefoot pain due to a large bunion deformity as well as a second hammertoe.  She has failed nonoperative treatment including activity and shoewear modification.  She presents now for surgical correction of these painful forefoot deformities.  After pre operative consent was obtained, and the correct operative site was identified, the patient was brought to the operating room and placed supine on the OR table.  Anesthesia was administered.  Pre-operative antibiotics were administered.  A surgical timeout was taken.   PROCEDURE IN DETAIL:  After pre operative consent was obtained, and the correct operative site was identified, the patient was brought to the operating room and placed supine on the OR table.  Anesthesia was administered.  Pre-operative antibiotics were administered.  A surgical timeout was taken.  The right lower extremity was prepped and draped in  standard sterile fashion with a tourniquet around the thigh.  The extremity was elevated, and the tourniquet was inflated to 250 mmHg.  A longitudinal incision was made at the dorsal first webspace.  Dissection was carried down to the subcutaneous tissues.  The intermetatarsal ligament was divided.  An arthrotomy was then made between the lateral sesamoid and the first metatarsal head.  The hallux could then be position 20 degrees of varus passively.  Attention was turned to the dorsum of the midfoot.  A longitudinal incision was made over the first TMT joint.  Dissection was carried sharply down through the subcutaneous tissues.  The extensor tendons were protected.  The first TMT joint was opened.  An oscillating saw was used to resect the remaining articular cartilage and subchondral bone from both sides of the joint.  The joint was reduced correcting the intermetatarsal and hallux valgus angles.  Joint was then fixed with a 4 mm [employer] partially-threaded cannulated screw followed by a 3.5 mm low-profile position screw.  Radiographs confirmed appropriate position of both screws and appropriate correction of the intermetatarsal and hallux valgus angles.  A small incision was made proximal to the medial eminence.  A 2 x 13 mm Shannon bur was inserted.  Under fluoroscopic guidance the medial eminence was resected in line with the first metatarsal shaft.  Wound was irrigated and all bone paste expressed.  Attention was turned to the second ray.  An incision was made lateral to the metatarsal shaft.  The periosteum was elevated dorsally and plantarly.  The bur was then used to create an oblique osteotomy from proximal plantar to dorsal distal.  The osteotomy mobility was confirmed with  fluoroscopy.  Attention was turned to the second toe where a small incision was made medial to the PIP joint.  The bur was then inserted under fluoroscopic guidance.  The remaining articular cartilage and subchondral bone  was removed from both sides of the joint.  The joint was then reduced.  A guidepin was inserted across the IP joints and overdrilled.  A 2.5 mm Arthrex headless compression screw was inserted and was noted to compress the PIP joint appropriately.  The dorsal joint capsule was noted to still be contracted elevating the toe into a floating position.  [facility] blade was then inserted through the first webspace incision under fluoroscopic guidance into the first MTP joint.  The medial collateral ligament was released along with the dorsal joint capsule and extensor tendons.  Final AP, lateral and oblique radiographs confirmed appropriate correction of the bunion and hammertoe deformity in appropriate position and length of all hardware.  The wounds were irrigated copiously and sprinkled with vancomycin  powder.  Subcutaneous tissues were approximated with Vicryl.  Skin incisions were closed with nylon.  Sterile dressings were applied followed by a well-padded short leg splint and a bolster between the hallux and second toe.  The tourniquet was released after application of the dressings.  The patient was awakened from anesthesia and transported to the recovery room in stable condition.   FOLLOW UP PLAN: Nonweightbearing on the right lower extremity.  Aspirin  for DVT prophylaxis.  Follow-up in 2 weeks for suture removal and conversion to a short leg cast with a toe spacer.   RADIOGRAPHS: AP, oblique and lateral radiographs of the right foot are obtained intraoperatively.  These show interval arthrodesis of the first TMT joint with correction of the intermetatarsal and hallux valgus angles.  Interval shortening of the second metatarsal and hammertoe correction with PIP joint arthrodesis are also noted.  No hardware failure.  No other acute injuries.   Dickey Sales, PA-C was present and scrubbed for the duration of the operative case. Her assistance was essential in positioning the patient, prepping and  draping, gaining and maintaining exposure, performing the operation, closing and dressing the wounds and applying the splint.

## 2024-02-01 NOTE — Progress Notes (Signed)
Assisted Dr. Howze with right, adductor canal, popliteal, ultrasound guided block. Side rails up, monitors on throughout procedure. See vital signs in flow sheet. Tolerated Procedure well. 

## 2024-02-01 NOTE — Anesthesia Procedure Notes (Signed)
 Procedure Name: LMA Insertion Date/Time: 02/01/2024 9:25 AM  Performed by: Leotha Andrez DEL, CRNAPre-anesthesia Checklist: Patient identified, Emergency Drugs available, Suction available, Patient being monitored and Timeout performed Patient Re-evaluated:Patient Re-evaluated prior to induction Oxygen Delivery Method: Circle system utilized Preoxygenation: Pre-oxygenation with 100% oxygen Induction Type: IV induction Ventilation: Mask ventilation without difficulty LMA: LMA inserted LMA Size: 4.0 Number of attempts: 1 Placement Confirmation: breath sounds checked- equal and bilateral and positive ETCO2 Tube secured with: Tape Dental Injury: Teeth and Oropharynx as per pre-operative assessment

## 2024-02-01 NOTE — Anesthesia Procedure Notes (Signed)
 Anesthesia Regional Block: Adductor canal block   Pre-Anesthetic Checklist: , timeout performed,  Correct Patient, Correct Site, Correct Laterality,  Correct Procedure, Correct Position, site marked,  Risks and benefits discussed,  Pre-op evaluation,  At surgeon's request and post-op pain management  Laterality: Right  Prep: Maximum Sterile Barrier Precautions used, chloraprep       Needles:  Injection technique: Single-shot  Needle Type: Echogenic Stimulator Needle     Needle Length: 9cm  Needle Gauge: 22     Additional Needles:   Procedures:,,,, ultrasound used (permanent image in chart),,    Narrative:  Start time: 02/01/2024 8:45 AM End time: 02/01/2024 8:48 AM Injection made incrementally with aspirations every 5 mL.  Performed by: Personally  Anesthesiologist: Paul Lamarr BRAVO, MD  Additional Notes: Risks, benefits, and alternative discussed. Patient gave consent for procedure. Patient prepped and draped in sterile fashion. Sedation administered, patient remains easily responsive to voice. Relevant anatomy identified with ultrasound guidance. Local anesthetic given in 5cc increments with no signs or symptoms of intravascular injection. No pain or paraesthesias with injection. Patient monitored throughout procedure with no signs of LAST or immediate complications. Tolerated well. Ultrasound image placed in chart.  LANEY Paul, MD

## 2024-02-01 NOTE — H&P (Addendum)
 Tina Juarez is an 59 y.o. female.   Chief Complaint: right foot pain HPI: 59 y/o female c/o worsening pain of the right foot due to a prominent bunion deformity and second hammertoe.  She has failed non op treatment including activity and shoewear modification.  She presents today for surgical correction of these painful deformities.  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 Avram, benign polyps, repeat in 10 yrs    LEFT HEART CATH AND CORONARY ANGIOGRAPHY N/A 12/20/2021   Procedure: LEFT HEART CATH AND CORONARY ANGIOGRAPHY;  Surgeon: Mady Bruckner, MD;  Location: [facility] INVASIVE CV LAB;  Service: Cardiovascular;  Laterality: N/A;    Family History  Problem Relation Age of Onset   Mental retardation Mother    Cancer Father    Hyperlipidemia Father    Prostate cancer Father    Breast cancer Paternal Grandmother        84s   Rectal cancer Neg Hx    Stomach cancer Neg Hx    Social History:  reports that she has never smoked. She has never used smokeless tobacco. She reports current alcohol use. She reports that she does not use drugs.  Allergies: Allergies[1]  No medications prior to admission.    No results found for this or any previous visit (from the past 48 hours). No results found.  Review of Systems  no recent f/c/n/v/ wtloss  Height 5' 6 (1.676 m), weight 76.2 kg. Physical Exam  Wn wd woman in nad.  A and O.   EOMI.  Resp unlabored.  R foot with healthy skin and palpable pulses.  TTP over the prominent medial eminence.  Active PF and DF strength of the ankle and toes.  Assessment/Plan R foot bunion and 2nd hammertoe- to the OR today for lapidus bunion correction and MIS weil and hammertoe corrections. The risks and benefits of the alternative treatment options have been discussed in detail.  The patient wishes to proceed with surgery and specifically understands risks of bleeding, infection, nerve damage, blood clots, need for additional surgery, amputation and death.   Norleen Armor, MD 2024-03-02, 7:27 AM       [1] No Known Allergies

## 2024-02-01 NOTE — Transfer of Care (Signed)
 Immediate Anesthesia Transfer of Care Note  Patient: Tina Juarez  Procedure(s) Performed: Right foot bunion correction with lapidus arthrodesis (Right: Foot) Right second metatarsal weil osteotomy; second metatarsalphalangeal dorsal capsulotomy (Right: Toe) Second hammertoe correction (Right: Toe)  Patient Location: PACU  Anesthesia Type:General  Level of Consciousness: awake and alert   Airway & Oxygen Therapy: Patient Spontanous Breathing  Post-op Assessment: Report given to RN and Post -op Vital signs reviewed and stable  Post vital signs: Reviewed and stable  Last Vitals:  Vitals Value Taken Time  BP 98/61 02/01/24 10:45  Temp 36.2 C 02/01/24 10:39  Pulse 61 02/01/24 10:51  Resp 13 02/01/24 10:51  SpO2 97 % 02/01/24 10:51  Vitals shown include unfiled device data.  Last Pain:  Vitals:   02/01/24 1039  TempSrc:   PainSc: Asleep         Complications: No notable events documented.

## 2024-02-01 NOTE — Anesthesia Preprocedure Evaluation (Addendum)
"                                    Anesthesia Evaluation  Patient identified by MRN, date of birth, ID band Patient awake    Reviewed: Allergy & Precautions, NPO status , Patient's Chart, lab work & pertinent test results  History of Anesthesia Complications Negative for: history of anesthetic complications  Airway Mallampati: II  TM Distance: >3 FB Neck ROM: Full    Dental no notable dental hx.    Pulmonary neg pulmonary ROS   Pulmonary exam normal        Cardiovascular + CAD (SCAD) and + Past MI  Normal cardiovascular exam     Neuro/Psych negative neurological ROS     GI/Hepatic negative GI ROS, Neg liver ROS,,,  Endo/Other  negative endocrine ROS    Renal/GU negative Renal ROS     Musculoskeletal Bunion, right   Abdominal   Peds  Hematology negative hematology ROS (+)   Anesthesia Other Findings   Reproductive/Obstetrics                              Anesthesia Physical Anesthesia Plan  ASA: 2  Anesthesia Plan: General   Post-op Pain Management: Tylenol  PO (pre-op)* and Regional block*   Induction: Intravenous  PONV Risk Score and Plan: 3 and Treatment may vary due to age or medical condition, Ondansetron , Dexamethasone  and Midazolam   Airway Management Planned: LMA  Additional Equipment: None  Intra-op Plan:   Post-operative Plan: Extubation in OR  Informed Consent: I have reviewed the patients History and Physical, chart, labs and discussed the procedure including the risks, benefits and alternatives for the proposed anesthesia with the patient or authorized representative who has indicated his/her understanding and acceptance.     Dental advisory given  Plan Discussed with: CRNA  Anesthesia Plan Comments:          Anesthesia Quick Evaluation  "

## 2024-02-01 NOTE — Anesthesia Procedure Notes (Signed)
 Anesthesia Regional Block: Popliteal block   Pre-Anesthetic Checklist: , timeout performed,  Correct Patient, Correct Site, Correct Laterality,  Correct Procedure, Correct Position, site marked,  Risks and benefits discussed,  Pre-op evaluation,  At surgeon's request and post-op pain management  Laterality: Right  Prep: Maximum Sterile Barrier Precautions used, chloraprep       Needles:  Injection technique: Single-shot  Needle Type: Echogenic Stimulator Needle     Needle Length: 9cm  Needle Gauge: 22     Additional Needles:   Procedures:,,,, ultrasound used (permanent image in chart),,    Narrative:  Start time: 02/01/2024 8:48 AM End time: 02/01/2024 8:50 AM Injection made incrementally with aspirations every 5 mL.  Performed by: Personally  Anesthesiologist: Paul Lamarr BRAVO, MD  Additional Notes: Risks, benefits, and alternative discussed. Patient gave consent for procedure. Patient prepped and draped in sterile fashion. Sedation administered, patient remains easily responsive to voice. Relevant anatomy identified with ultrasound guidance. Local anesthetic given in 5cc increments with no signs or symptoms of intravascular injection. No pain or paraesthesias with injection. Patient monitored throughout procedure with no signs of LAST or immediate complications. Tolerated well. Ultrasound image placed in chart.  LANEY Paul, MD

## 2024-02-02 ENCOUNTER — Encounter (HOSPITAL_BASED_OUTPATIENT_CLINIC_OR_DEPARTMENT_OTHER): Payer: Self-pay | Admitting: Orthopedic Surgery

## 2024-02-26 ENCOUNTER — Ambulatory Visit

## 2024-06-05 ENCOUNTER — Other Ambulatory Visit (HOSPITAL_BASED_OUTPATIENT_CLINIC_OR_DEPARTMENT_OTHER)
